# Patient Record
Sex: Female | Born: 1955 | Race: White | Hispanic: No | Smoking: Former smoker
Health system: Southern US, Community
[De-identification: ages and names within clinical notes are randomized; demographics above are authoritative.]

## PROBLEM LIST (undated history)

## (undated) DIAGNOSIS — R200 Anesthesia of skin: Secondary | ICD-10-CM

## (undated) DIAGNOSIS — Z8719 Personal history of other diseases of the digestive system: Secondary | ICD-10-CM

## (undated) DIAGNOSIS — K219 Gastro-esophageal reflux disease without esophagitis: Secondary | ICD-10-CM

## (undated) DIAGNOSIS — N951 Menopausal and female climacteric states: Secondary | ICD-10-CM

## (undated) DIAGNOSIS — E559 Vitamin D deficiency, unspecified: Secondary | ICD-10-CM

## (undated) DIAGNOSIS — D649 Anemia, unspecified: Secondary | ICD-10-CM

## (undated) DIAGNOSIS — T4145XA Adverse effect of unspecified anesthetic, initial encounter: Secondary | ICD-10-CM

## (undated) DIAGNOSIS — T8859XA Other complications of anesthesia, initial encounter: Secondary | ICD-10-CM

## (undated) DIAGNOSIS — Z8489 Family history of other specified conditions: Secondary | ICD-10-CM

## (undated) DIAGNOSIS — F32A Depression, unspecified: Secondary | ICD-10-CM

## (undated) DIAGNOSIS — F419 Anxiety disorder, unspecified: Secondary | ICD-10-CM

## (undated) DIAGNOSIS — R112 Nausea with vomiting, unspecified: Secondary | ICD-10-CM

## (undated) DIAGNOSIS — Z9889 Other specified postprocedural states: Secondary | ICD-10-CM

## (undated) DIAGNOSIS — M549 Dorsalgia, unspecified: Secondary | ICD-10-CM

## (undated) DIAGNOSIS — M1990 Unspecified osteoarthritis, unspecified site: Secondary | ICD-10-CM

## (undated) DIAGNOSIS — E785 Hyperlipidemia, unspecified: Secondary | ICD-10-CM

## (undated) DIAGNOSIS — G5600 Carpal tunnel syndrome, unspecified upper limb: Secondary | ICD-10-CM

## (undated) HISTORY — DX: Carpal tunnel syndrome, unspecified upper limb: G56.00

## (undated) HISTORY — PX: ABDOMINAL HYSTERECTOMY: SHX81

## (undated) HISTORY — DX: Hyperlipidemia, unspecified: E78.5

## (undated) HISTORY — DX: Menopausal and female climacteric states: N95.1

## (undated) HISTORY — DX: Dorsalgia, unspecified: M54.9

## (undated) HISTORY — PX: COLONOSCOPY: SHX174

---

## 1994-02-21 HISTORY — PX: OOPHORECTOMY: SHX86

## 1998-01-13 ENCOUNTER — Ambulatory Visit (HOSPITAL_COMMUNITY): Admission: RE | Admit: 1998-01-13 | Discharge: 1998-01-13 | Payer: Self-pay

## 2002-07-10 ENCOUNTER — Other Ambulatory Visit: Admission: RE | Admit: 2002-07-10 | Discharge: 2002-07-10 | Payer: Self-pay | Admitting: Family Medicine

## 2002-07-15 ENCOUNTER — Encounter: Payer: Self-pay | Admitting: Family Medicine

## 2002-07-15 ENCOUNTER — Encounter: Admission: RE | Admit: 2002-07-15 | Discharge: 2002-07-15 | Payer: Self-pay | Admitting: Family Medicine

## 2003-07-15 ENCOUNTER — Other Ambulatory Visit: Admission: RE | Admit: 2003-07-15 | Discharge: 2003-07-15 | Payer: Self-pay | Admitting: Internal Medicine

## 2004-04-08 ENCOUNTER — Ambulatory Visit: Payer: Self-pay | Admitting: Family Medicine

## 2004-08-27 ENCOUNTER — Other Ambulatory Visit: Admission: RE | Admit: 2004-08-27 | Discharge: 2004-08-27 | Payer: Self-pay | Admitting: Family Medicine

## 2004-08-27 ENCOUNTER — Ambulatory Visit: Payer: Self-pay | Admitting: Family Medicine

## 2004-10-19 ENCOUNTER — Ambulatory Visit: Payer: Self-pay | Admitting: Family Medicine

## 2006-03-23 ENCOUNTER — Ambulatory Visit: Payer: Self-pay | Admitting: Family Medicine

## 2006-04-06 ENCOUNTER — Ambulatory Visit: Payer: Self-pay | Admitting: Family Medicine

## 2006-10-24 ENCOUNTER — Ambulatory Visit: Payer: Self-pay | Admitting: Family Medicine

## 2007-05-04 ENCOUNTER — Ambulatory Visit: Payer: Self-pay | Admitting: Family Medicine

## 2007-05-04 DIAGNOSIS — J45909 Unspecified asthma, uncomplicated: Secondary | ICD-10-CM | POA: Insufficient documentation

## 2007-05-18 ENCOUNTER — Ambulatory Visit: Payer: Self-pay | Admitting: Family Medicine

## 2007-07-25 ENCOUNTER — Ambulatory Visit: Payer: Self-pay | Admitting: Family Medicine

## 2007-07-25 ENCOUNTER — Telehealth: Payer: Self-pay | Admitting: Family Medicine

## 2007-07-25 DIAGNOSIS — M5137 Other intervertebral disc degeneration, lumbosacral region: Secondary | ICD-10-CM | POA: Insufficient documentation

## 2007-07-30 ENCOUNTER — Telehealth: Payer: Self-pay | Admitting: Family Medicine

## 2007-07-30 ENCOUNTER — Ambulatory Visit: Payer: Self-pay | Admitting: Family Medicine

## 2007-08-13 ENCOUNTER — Ambulatory Visit: Payer: Self-pay | Admitting: Family Medicine

## 2007-08-21 ENCOUNTER — Ambulatory Visit: Payer: Self-pay | Admitting: Family Medicine

## 2007-08-21 DIAGNOSIS — J45909 Unspecified asthma, uncomplicated: Secondary | ICD-10-CM | POA: Insufficient documentation

## 2007-08-21 DIAGNOSIS — L821 Other seborrheic keratosis: Secondary | ICD-10-CM | POA: Insufficient documentation

## 2008-01-14 ENCOUNTER — Ambulatory Visit: Payer: Self-pay | Admitting: Family Medicine

## 2008-01-14 DIAGNOSIS — J029 Acute pharyngitis, unspecified: Secondary | ICD-10-CM | POA: Insufficient documentation

## 2008-01-14 LAB — CONVERTED CEMR LAB: Rapid Strep: NEGATIVE

## 2008-01-25 ENCOUNTER — Encounter: Admission: RE | Admit: 2008-01-25 | Discharge: 2008-01-25 | Payer: Self-pay | Admitting: Family Medicine

## 2008-01-30 ENCOUNTER — Ambulatory Visit: Payer: Self-pay | Admitting: Family Medicine

## 2008-01-30 LAB — CONVERTED CEMR LAB
Basophils Absolute: 0 10*3/uL (ref 0.0–0.1)
Basophils Relative: 0.4 % (ref 0.0–3.0)
Eosinophils Absolute: 0.2 10*3/uL (ref 0.0–0.7)
Eosinophils Relative: 2.7 % (ref 0.0–5.0)
HCT: 36.9 % (ref 36.0–46.0)
Hemoglobin: 12.4 g/dL (ref 12.0–15.0)
Lymphocytes Relative: 31.6 % (ref 12.0–46.0)
MCHC: 33.7 g/dL (ref 30.0–36.0)
MCV: 81.7 fL (ref 78.0–100.0)
Monocytes Absolute: 0.6 10*3/uL (ref 0.1–1.0)
Monocytes Relative: 8.6 % (ref 3.0–12.0)
Neutro Abs: 3.7 10*3/uL (ref 1.4–7.7)
Neutrophils Relative %: 56.7 % (ref 43.0–77.0)
Platelets: 260 10*3/uL (ref 150–400)
RBC: 4.51 M/uL (ref 3.87–5.11)
RDW: 13.5 % (ref 11.5–14.6)
TSH: 1.57 microintl units/mL (ref 0.35–5.50)
WBC: 6.6 10*3/uL (ref 4.5–10.5)

## 2008-02-06 ENCOUNTER — Other Ambulatory Visit: Admission: RE | Admit: 2008-02-06 | Discharge: 2008-02-06 | Payer: Self-pay | Admitting: Family Medicine

## 2008-02-06 ENCOUNTER — Encounter: Payer: Self-pay | Admitting: Family Medicine

## 2008-02-06 ENCOUNTER — Telehealth: Payer: Self-pay | Admitting: Family Medicine

## 2008-02-06 ENCOUNTER — Ambulatory Visit: Payer: Self-pay | Admitting: Family Medicine

## 2008-02-06 DIAGNOSIS — N959 Unspecified menopausal and perimenopausal disorder: Secondary | ICD-10-CM | POA: Insufficient documentation

## 2008-02-06 DIAGNOSIS — Z8669 Personal history of other diseases of the nervous system and sense organs: Secondary | ICD-10-CM | POA: Insufficient documentation

## 2008-02-06 DIAGNOSIS — G609 Hereditary and idiopathic neuropathy, unspecified: Secondary | ICD-10-CM | POA: Insufficient documentation

## 2008-02-06 DIAGNOSIS — M545 Low back pain, unspecified: Secondary | ICD-10-CM | POA: Insufficient documentation

## 2008-02-08 ENCOUNTER — Ambulatory Visit: Payer: Self-pay | Admitting: Family Medicine

## 2008-02-08 LAB — CONVERTED CEMR LAB
BUN: 8 mg/dL (ref 6–23)
CO2: 27 meq/L (ref 19–32)
Calcium: 9.4 mg/dL (ref 8.4–10.5)
Chloride: 108 meq/L (ref 96–112)
Cholesterol: 244 mg/dL (ref 0–200)
Creatinine, Ser: 0.7 mg/dL (ref 0.4–1.2)
Direct LDL: 160.2 mg/dL
GFR calc Af Amer: 114 mL/min
GFR calc non Af Amer: 94 mL/min
Glucose, Bld: 98 mg/dL (ref 70–99)
HDL: 56.7 mg/dL (ref >39.0)
Potassium: 4.4 meq/L (ref 3.5–5.1)
Sodium: 141 meq/L (ref 135–145)
Total CHOL/HDL Ratio: 4.3
Triglycerides: 109 mg/dL (ref 0–149)
VLDL: 22 mg/dL (ref 0–40)

## 2008-02-18 ENCOUNTER — Ambulatory Visit: Payer: Self-pay | Admitting: Family Medicine

## 2008-02-18 DIAGNOSIS — E785 Hyperlipidemia, unspecified: Secondary | ICD-10-CM | POA: Insufficient documentation

## 2008-02-27 ENCOUNTER — Ambulatory Visit: Payer: Self-pay | Admitting: Gastroenterology

## 2008-03-07 ENCOUNTER — Ambulatory Visit: Payer: Self-pay | Admitting: Family Medicine

## 2008-04-18 ENCOUNTER — Ambulatory Visit: Payer: Self-pay | Admitting: Family Medicine

## 2008-04-18 LAB — CONVERTED CEMR LAB
ALT: 17 units/L (ref 0–35)
AST: 20 units/L (ref 0–37)
Albumin: 3.7 g/dL (ref 3.5–5.2)
Alkaline Phosphatase: 68 units/L (ref 39–117)
Bilirubin, Direct: 0.1 mg/dL (ref 0.0–0.3)
Cholesterol: 185 mg/dL (ref 0–200)
HDL: 63.9 mg/dL (ref >39.0)
LDL Cholesterol: 102 mg/dL — ABNORMAL HIGH (ref 0–99)
Total Bilirubin: 0.6 mg/dL (ref 0.3–1.2)
Total CHOL/HDL Ratio: 2.9
Total Protein: 6.8 g/dL (ref 6.0–8.3)
Triglycerides: 94 mg/dL (ref 0–149)
VLDL: 19 mg/dL (ref 0–40)

## 2008-05-05 ENCOUNTER — Ambulatory Visit: Payer: Self-pay | Admitting: Family Medicine

## 2008-07-18 ENCOUNTER — Telehealth: Payer: Self-pay | Admitting: Family Medicine

## 2008-08-13 ENCOUNTER — Telehealth: Payer: Self-pay | Admitting: *Deleted

## 2008-09-01 DIAGNOSIS — H8110 Benign paroxysmal vertigo, unspecified ear: Secondary | ICD-10-CM | POA: Insufficient documentation

## 2008-09-02 ENCOUNTER — Ambulatory Visit: Payer: Self-pay | Admitting: Family Medicine

## 2008-09-02 DIAGNOSIS — F419 Anxiety disorder, unspecified: Secondary | ICD-10-CM | POA: Insufficient documentation

## 2008-09-08 ENCOUNTER — Ambulatory Visit: Payer: Self-pay | Admitting: Family Medicine

## 2008-09-08 DIAGNOSIS — J309 Allergic rhinitis, unspecified: Secondary | ICD-10-CM | POA: Insufficient documentation

## 2008-09-10 ENCOUNTER — Telehealth (INDEPENDENT_AMBULATORY_CARE_PROVIDER_SITE_OTHER): Payer: Self-pay | Admitting: *Deleted

## 2008-11-21 ENCOUNTER — Telehealth: Payer: Self-pay | Admitting: Family Medicine

## 2009-01-30 ENCOUNTER — Telehealth: Payer: Self-pay | Admitting: Family Medicine

## 2009-02-09 LAB — HM MAMMOGRAPHY

## 2009-02-11 ENCOUNTER — Encounter: Admission: RE | Admit: 2009-02-11 | Discharge: 2009-02-11 | Payer: Self-pay | Admitting: Family Medicine

## 2009-03-11 ENCOUNTER — Telehealth: Payer: Self-pay | Admitting: Family Medicine

## 2009-04-10 ENCOUNTER — Ambulatory Visit: Payer: Self-pay | Admitting: Family Medicine

## 2009-04-10 LAB — CONVERTED CEMR LAB
ALT: 17 units/L (ref 0–35)
AST: 18 units/L (ref 0–37)
Albumin: 3.4 g/dL — ABNORMAL LOW (ref 3.5–5.2)
Alkaline Phosphatase: 78 units/L (ref 39–117)
BUN: 9 mg/dL (ref 6–23)
Basophils Absolute: 0.1 10*3/uL (ref 0.0–0.1)
Basophils Relative: 0.9 % (ref 0.0–3.0)
Bilirubin Urine: NEGATIVE
Bilirubin, Direct: 0.1 mg/dL (ref 0.0–0.3)
CO2: 27 meq/L (ref 19–32)
Calcium: 9.1 mg/dL (ref 8.4–10.5)
Chloride: 108 meq/L (ref 96–112)
Cholesterol: 206 mg/dL — ABNORMAL HIGH (ref 0–200)
Creatinine, Ser: 0.7 mg/dL (ref 0.4–1.2)
Direct LDL: 135.2 mg/dL
Eosinophils Absolute: 0.2 10*3/uL (ref 0.0–0.7)
Eosinophils Relative: 3.2 % (ref 0.0–5.0)
GFR calc non Af Amer: 93.68 mL/min (ref >60)
Glucose, Bld: 92 mg/dL (ref 70–99)
HCT: 31.8 % — ABNORMAL LOW (ref 36.0–46.0)
HDL: 57.9 mg/dL (ref >39.00)
Hemoglobin: 10.3 g/dL — ABNORMAL LOW (ref 12.0–15.0)
Ketones, ur: NEGATIVE mg/dL
Leukocytes, UA: NEGATIVE
Lymphocytes Relative: 25 % (ref 12.0–46.0)
Lymphs Abs: 1.7 10*3/uL (ref 0.7–4.0)
MCHC: 32.4 g/dL (ref 30.0–36.0)
MCV: 76.2 fL — ABNORMAL LOW (ref 78.0–100.0)
Monocytes Absolute: 0.6 10*3/uL (ref 0.1–1.0)
Monocytes Relative: 9 % (ref 3.0–12.0)
Neutro Abs: 4.2 10*3/uL (ref 1.4–7.7)
Neutrophils Relative %: 61.9 % (ref 43.0–77.0)
Nitrite: NEGATIVE
Platelets: 295 10*3/uL (ref 150.0–400.0)
Potassium: 4.3 meq/L (ref 3.5–5.1)
RBC: 4.17 M/uL (ref 3.87–5.11)
RDW: 15.9 % — ABNORMAL HIGH (ref 11.5–14.6)
Sodium: 138 meq/L (ref 135–145)
Specific Gravity, Urine: 1.02 (ref 1.000–1.030)
TSH: 1.52 microintl units/mL (ref 0.35–5.50)
Total Bilirubin: 0.3 mg/dL (ref 0.3–1.2)
Total CHOL/HDL Ratio: 4
Total Protein, Urine: NEGATIVE mg/dL
Total Protein: 6.8 g/dL (ref 6.0–8.3)
Triglycerides: 118 mg/dL (ref 0.0–149.0)
Urine Glucose: NEGATIVE mg/dL
Urobilinogen, UA: 0.2 (ref 0.0–1.0)
VLDL: 23.6 mg/dL (ref 0.0–40.0)
WBC: 6.8 10*3/uL (ref 4.5–10.5)
pH: 7 (ref 5.0–8.0)

## 2009-04-13 ENCOUNTER — Ambulatory Visit: Payer: Self-pay | Admitting: Family Medicine

## 2009-04-14 ENCOUNTER — Telehealth: Payer: Self-pay | Admitting: Family Medicine

## 2009-04-15 ENCOUNTER — Encounter (INDEPENDENT_AMBULATORY_CARE_PROVIDER_SITE_OTHER): Payer: Self-pay | Admitting: *Deleted

## 2009-04-20 ENCOUNTER — Telehealth: Payer: Self-pay | Admitting: Family Medicine

## 2009-06-17 ENCOUNTER — Encounter: Payer: Self-pay | Admitting: Family Medicine

## 2009-07-03 ENCOUNTER — Ambulatory Visit (HOSPITAL_COMMUNITY): Admission: RE | Admit: 2009-07-03 | Discharge: 2009-07-03 | Payer: Self-pay | Admitting: Obstetrics and Gynecology

## 2009-07-31 ENCOUNTER — Ambulatory Visit: Payer: Self-pay | Admitting: Family Medicine

## 2009-07-31 LAB — CONVERTED CEMR LAB
BUN: 11 mg/dL (ref 6–23)
Basophils Absolute: 0 10*3/uL (ref 0.0–0.1)
Basophils Relative: 0.5 % (ref 0.0–3.0)
CO2: 30 meq/L (ref 19–32)
Calcium: 9.6 mg/dL (ref 8.4–10.5)
Chloride: 106 meq/L (ref 96–112)
Creatinine, Ser: 0.8 mg/dL (ref 0.4–1.2)
Eosinophils Absolute: 0.6 10*3/uL (ref 0.0–0.7)
Eosinophils Relative: 7.1 % — ABNORMAL HIGH (ref 0.0–5.0)
GFR calc non Af Amer: 82.58 mL/min (ref >60)
Glucose, Bld: 78 mg/dL (ref 70–99)
HCT: 38.7 % (ref 36.0–46.0)
Hemoglobin: 13.1 g/dL (ref 12.0–15.0)
Lymphocytes Relative: 21.1 % (ref 12.0–46.0)
Lymphs Abs: 1.9 10*3/uL (ref 0.7–4.0)
MCHC: 33.9 g/dL (ref 30.0–36.0)
MCV: 81.7 fL (ref 78.0–100.0)
Monocytes Absolute: 0.7 10*3/uL (ref 0.1–1.0)
Monocytes Relative: 7.8 % (ref 3.0–12.0)
Neutro Abs: 5.6 10*3/uL (ref 1.4–7.7)
Neutrophils Relative %: 63.5 % (ref 43.0–77.0)
Platelets: 294 10*3/uL (ref 150.0–400.0)
Potassium: 5.3 meq/L — ABNORMAL HIGH (ref 3.5–5.1)
RBC: 4.74 M/uL (ref 3.87–5.11)
RDW: 15.7 % — ABNORMAL HIGH (ref 11.5–14.6)
Sodium: 144 meq/L (ref 135–145)
WBC: 8.9 10*3/uL (ref 4.5–10.5)

## 2009-08-12 ENCOUNTER — Telehealth (INDEPENDENT_AMBULATORY_CARE_PROVIDER_SITE_OTHER): Payer: Self-pay | Admitting: *Deleted

## 2010-03-25 NOTE — Procedures (Signed)
Summary: Colonoscopy, EGD/[facility]  Colonoscopy, EGD/[facility]   Imported By: Maryln Gottron 06/22/2009 14:10:07  _____________________________________________________________________  External Attachment:    Type:   Image     Comment:   External Document

## 2010-03-25 NOTE — Progress Notes (Signed)
  Phone Note Other Incoming   Request: Send information Summary of Call: Records received from Dr. Marlane Hatcher with [facility]. 6 pages forwarded to Dr. Jarold Motto for review.

## 2010-03-25 NOTE — Assessment & Plan Note (Signed)
Summary: cpx/cjr   Vital Signs:  Patient profile:   55 year old female Height:      65 inches Weight:      187 pounds BMI:     31.23 Temp:     98.5 degrees F oral BP sitting:   124 / 84  (left arm) Cuff size:   regular  Vitals Entered By: Kern Reap CMA Duncan Dull) (April 13, 2009 4:09 PM)  Reason for Visit cpx   History of Present Illness: Patricia Ferrell is a 55 year old, married female, nonsmoker, who comes in today for physical examination  She has a history of underlying hyperlipidemia, but has not taken her Zocor and over 6 months.  She also has a history of mild depression.  She stopped her Celexa and feels okay except she has an occasional spell and anxiety.  She would like to keep some Ativan .5 intake p.r.n.  She gets routine eye care.  Dental care does BSE monthly.  Continue mammography Pap by GYN.  Tetanus 2009.  A new problem is numbness in both hands.  It comes and goes.  She does work with her hands all day long  she's also having symptoms of reflux esophagitis with regurgitation of food in her esophagus.  Allergies: 1)  ! Codeine  Past History:  Past medical, surgical, family and social histories (including risk factors) reviewed, and no changes noted (except as noted below).  Past Medical History: Reviewed history from 02/18/2008 and no changes required. childbirth x 1 left ovary removed,  exploratory laparoscopy, 89 Asthma Low back pain perimenopausal couple tunnel syndrome, right, left wrist Hyperlipidemia  Family History: Reviewed history from 05/04/2007 and no changes required. a father mid-70s.  He has occurred, colon polyps mother late 36s, coronary disease, MI, smoker, and hypertension one brother in good health.  One sister in good health  Social History: Reviewed history from 02/06/2008 and no changes required. 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  She is single with two  children Married Never Smoked Alcohol use-no Drug use-no Regular exercise-yes  Review of Systems      See HPI  Physical Exam  General:  Well-developed,well-nourished,in no acute distress; alert,appropriate and cooperative throughout examination Head:  Normocephalic and atraumatic without obvious abnormalities. No apparent alopecia or balding. Eyes:  No corneal or conjunctival inflammation noted. EOMI. Perrla. Funduscopic exam benign, without hemorrhages, exudates or papilledema. Vision grossly normal. Ears:  External ear exam shows no significant lesions or deformities.  Otoscopic examination reveals clear canals, tympanic membranes are intact bilaterally without bulging, retraction, inflammation or discharge. Hearing is grossly normal bilaterally. Nose:  External nasal examination shows no deformity or inflammation. Nasal mucosa are pink and moist without lesions or exudates. Mouth:  Oral mucosa and oropharynx without lesions or exudates.  Teeth in good repair. Neck:  No deformities, masses, or tenderness noted. Chest Wall:  No deformities, masses, or tenderness noted. Breasts:  No mass, nodules, thickening, tenderness, bulging, retraction, inflamation, nipple discharge or skin changes noted.   Lungs:  Normal respiratory effort, chest expands symmetrically. Lungs are clear to auscultation, no crackles or wheezes. Heart:  Normal rate and regular rhythm. S1 and S2 normal without gallop, murmur, click, rub or other extra sounds. Abdomen:  Bowel sounds positive,abdomen soft and non-tender without masses, organomegaly or hernias noted. Msk:  No deformity or scoliosis noted of thoracic or lumbar spine.   Pulses:  R and L carotid,radial,femoral,dorsalis pedis and posterior tibial pulses are full and equal  bilaterally Extremities:  No clubbing, cyanosis, edema, or deformity noted with normal full range of motion of all joints.   Neurologic:  No cranial nerve deficits noted. Station and gait are  normal. Plantar reflexes are down-going bilaterally. DTRs are symmetrical throughout. Sensory, motor and coordinative functions appear intact. Skin:  Intact without suspicious lesions or rashes Cervical Nodes:  No lymphadenopathy noted Axillary Nodes:  No palpable lymphadenopathy Inguinal Nodes:  No significant adenopathy Psych:  Cognition and judgment appear intact. Alert and cooperative with normal attention span and concentration. No apparent delusions, illusions, hallucinations   Impression & Recommendations:  Problem # 1:  ANXIETY (ICD-300.00) Assessment Improved  Her updated medication list for this problem includes:    Celexa 20 Mg Tabs (Citalopram hydrobromide) .Marland Kitchen... 1 tab @ bedtime    Fluoxetine Hcl 40 Mg Caps (Fluoxetine hcl) .Marland Kitchen... Take one tablet at bedtime    Ativan 0.5 Mg Tabs (Lorazepam) .Marland Kitchen... Take 1 tablet by mouth two times a day  Orders: Prescription Created Electronically [phone])  Problem # 2:  HYPERLIPIDEMIA (ICD-272.4) Assessment: Deteriorated  Her updated medication list for this problem includes:    Zocor 20 Mg Tabs (Simvastatin) .Marland Kitchen... 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 1 tab @ bedtime 2)  Fluoxetine Hcl 40 Mg Caps (Fluoxetine hcl) .... Take one tablet at bedtime 3)  Zocor 20 Mg Tabs (Simvastatin) .Marland Kitchen.. 1 tab @ bedtime 4)  Hydromet 5-1.5 Mg/10ml Syrp (Hydrocodone-homatropine) .Marland Kitchen.. 1 or 2 tsps three times a day as needed 5)  Ativan 0.5 Mg Tabs (Lorazepam) .... Take 1 tablet by mouth two times a day 6)  Zovirax 400 Mg Tabs (Acyclovir) .... One by mouth qid  Other  Orders: Gastroenterology Referral (GI)  Patient Instructions: 1)  Please schedule a follow-up appointment in 1 year. 2)  It is important that you exercise regularly at least 20 minutes 5 times a week. If you develop chest pain, have severe difficulty breathing, or feel very tired , stop exercising immediately and seek medical attention. 3)  Schedule your mammogram. 4)  Schedule a colonoscopy/sigmoidoscopy to help detect colon cancer. 5)  Take calcium +Vitamin D daily. 6)  Take an Aspirin every day. 7)  take Motrin, 600 mg twice a day with food, and where the short arm splints nightly.  If his symptoms do not improve call Dr. Molly Maduro Cypher.  The hand surgeon 8)  take iron, one tablet at bedtime x 3 months. 9)  Two the anti-reflex measures that we discussed, including Prilosec, 20 mg OTC twice a day.  We will get to set up for a GI consult Prescriptions: ATIVAN 0.5 MG TABS (LORAZEPAM) Take 1 tablet by mouth two times a day  #60 x 4   Entered and Authorized by:   Roderick Pee MD   Signed by:   Roderick Pee MD on 04/13/2009   Method used:   Print then Give to Patient   RxID:   [id number] ZOCOR 20 MG TABS (SIMVASTATIN) 1 tab @ bedtime  #100 x 3   Entered and Authorized by:   Roderick Pee MD   Signed by:   Roderick Pee MD on 04/13/2009   Method used:  Print then Give to Patient   RxID:   [id number]

## 2010-03-25 NOTE — Assessment & Plan Note (Signed)
Summary: diarrhea x 3 days//ccm   Vital Signs:  Patient profile:   55 year old female Weight:      157 pounds Temp:     97.5 degrees F oral BP sitting:   98 / 68  (left arm)  Vitals Entered By: Kern Reap CMA (AAMA) (July 31, 2009 10:02 AM) CC: diarrhea x 3 days   CC:  diarrhea x 3 days.  History of Present Illness: Patricia Ferrell is a 55 year old female, nonsmoker, who comes in today with a two-day history of diarrhea.  Last Friday.  She had a laparoscopic hysterectomy.  They took her uterus and left ovary out.  The ovary and the uterus were here to her colon and it required a lot of dissection.  She had her right ovary removed many years ago.  She tolerated the procedure well, had no complications and was discharged to home.  The following day.  She did well until two days ago, when she developed diarrhea.  Prior to that she does have a bowel movement for 4 days.  She had 5 loose small bowel movements today.  No fever, chills, or vomiting.  Postop she is not taking any ATB, only some pain pills as needed.  Allergies: 1)  ! Codeine  Social History: Reviewed history from 02/06/2008 and no changes required. 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  She is single with two children Married Never Smoked Alcohol use-no Drug use-no Regular exercise-yes  Review of Systems      See HPI  Physical Exam  General:  Well-developed,well-nourished,in no acute distress; alert,appropriate and cooperative throughout examination Abdomen:  the abdomen appears normal.  Bowel sounds are normal.  The stab wounds are covered with Steri-Strips.  There is diffuse tenderness, but no rebound   Problems:  Medical Problems Added: 1)  Dx of Diarrhea  (ICD-787.91)  Impression & Recommendations:  Problem # 1:  DIARRHEA (ICD-787.91) Assessment New  Orders: Venipuncture ([address]) TLB-BMP (Basic Metabolic Panel-BMET) (80048-METABOL) TLB-CBC Platelet -  w/Differential (85025-CBCD)  Complete Medication List: 1)  Celexa 20 Mg Tabs (Citalopram hydrobromide) .Marland Kitchen.. 1 tab @ bedtime 2)  Fluoxetine Hcl 40 Mg Caps (Fluoxetine hcl) .... Take one tablet at bedtime 3)  Zocor 20 Mg Tabs (Simvastatin) .Marland Kitchen.. 1 tab @ bedtime 4)  Hydromet 5-1.5 Mg/52ml Syrp (Hydrocodone-homatropine) .Marland Kitchen.. 1 or 2 tsps three times a day as needed 5)  Ativan 0.5 Mg Tabs (Lorazepam) .... Take 1 tablet by mouth two times a day 6)  Zovirax 400 Mg Tabs (Acyclovir) .... One by mouth qid  Patient Instructions: 1)  stay on a clear liquid diet for the next couple days.......... starting tomorrow and, you can add some soft food like bananas applesauce, dry toast baked chicken...........Marland Kitchenwhen you do resume a regular diet, avoid fat

## 2010-03-25 NOTE — Assessment & Plan Note (Signed)
Summary: dizziness/njr   Vital Signs:  Patient profile:   55 year old female Weight:      183 pounds Temp:     98.1 degrees F oral BP sitting:   120 / 90  (left arm) Cuff size:   regular  Vitals Entered By: Kern Reap CMA (September 02, 2008 12:06 PM)  Reason for Visit lightheaded, nausea, head pressure  History of Present Illness: Patricia Ferrell is a 55 year old female, who comes in today for evaluation of head congestion, nausea, vertigo and anxiety.  Yesterday, she developed the sudden onset of vertigo.  If she holds still she feels fine.  If she moves quickly.  She has a spinning sensation.  She has no hearing loss.  Neurologic review of systems negative.  Two weeks ago.  She stopped taking the Celexa.  She thought she was having side effects, and it wasn't doing any good.  We previously tried Zoloft and Prozac.  Neither of those compounds work either.  She states she has mood swings, but she does not describe bipolar type, depression.  Allergies: 1)  ! Codeine  Past History:  Past medical, surgical, family and social histories (including risk factors) reviewed, and no changes noted (except as noted below).  Past Medical History: Reviewed history from 02/18/2008 and no changes required. childbirth x 1 left ovary removed,  exploratory laparoscopy, 89 Asthma Low back pain perimenopausal couple tunnel syndrome, right, left wrist Hyperlipidemia  Family History: Reviewed history from 05/04/2007 and no changes required. a father mid-70s.  He has occurred, colon polyps mother late 56s, coronary disease, MI, smoker, and hypertension one brother in good health.  One sister in good health  Social History: Reviewed history from 02/06/2008 and no changes required. 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  She is single with two children Married Never Smoked Alcohol use-no Drug use-no Regular exercise-yes  Review of Systems  See HPI  Physical Exam  General:  Well-developed,well-nourished,in no acute distress; alert,appropriate and cooperative throughout examination Head:  Normocephalic and atraumatic without obvious abnormalities. No apparent alopecia or balding. Eyes:  No corneal or conjunctival inflammation noted. EOMI. Perrla. Funduscopic exam benign, without hemorrhages, exudates or papilledema. Vision grossly normal. Ears:  External ear exam shows no significant lesions or deformities.  Otoscopic examination reveals clear canals, tympanic membranes are intact bilaterally without bulging, retraction, inflammation or discharge. Hearing is grossly normal bilaterally. Nose:  External nasal examination shows no deformity or inflammation. Nasal mucosa are pink and moist without lesions or exudates. Mouth:  Oral mucosa and oropharynx without lesions or exudates.  Teeth in good repair. Neurologic:  No cranial nerve deficits noted. Station and gait are normal. Plantar reflexes are down-going bilaterally. DTRs are symmetrical throughout. Sensory, motor and coordinative functions appear intact. Psych:  Cognition and judgment appear intact. Alert and cooperative with normal attention span and concentration. No apparent delusions, illusions, hallucinations   Impression & Recommendations:  Problem # 1:  VERTIGO, POSITIONAL (ICD-386.11) Assessment New  Problem # 2:  ANXIETY (ICD-300.00) Assessment: Deteriorated  The following medications were removed from the medication list:    Celexa 40 Mg Tabs (Citalopram hydrobromide) .Marland Kitchen... Take one and half tabs at bedtime Her updated medication list for this problem includes:    Celexa 20 Mg Tabs (Citalopram hydrobromide) .Marland Kitchen... 1 tab @ bedtime    Fluoxetine Hcl 40 Mg Caps (Fluoxetine hcl) .Marland Kitchen... Take one tablet at bedtime    Ativan 0.5 Mg Tabs (Lorazepam) .Marland Kitchen... Take 1 tablet  by mouth two times a day  Complete Medication List: 1)  Flexeril 10 Mg Tabs (Cyclobenzaprine hcl) ....  Take 1 tablet by mouth three times a day 2)  Celexa 20 Mg Tabs (Citalopram hydrobromide) .Marland Kitchen.. 1 tab @ bedtime 3)  Fluoxetine Hcl 40 Mg Caps (Fluoxetine hcl) .... Take one tablet at bedtime 4)  Zocor 20 Mg Tabs (Simvastatin) .Marland Kitchen.. 1 tab @ bedtime 5)  Hydromet 5-1.5 Mg/49ml Syrp (Hydrocodone-homatropine) .Marland Kitchen.. 1 or 2 tsps three times a day as needed 6)  Ativan 0.5 Mg Tabs (Lorazepam) .... Take 1 tablet by mouth two times a day  Patient Instructions: 1)  begin Ativan .5 mg b.i.d.Marland Kitchen 2)  Note turned her head quickly.  It will trigger the vertigo. 3)  Call Dr. Rolly Pancake, [place] Mu for consultation use my name.  As the referring physician Prescriptions: ATIVAN 0.5 MG TABS (LORAZEPAM) Take 1 tablet by mouth two times a day  #60 x 2   Entered and Authorized by:   Roderick Pee MD   Signed by:   Roderick Pee MD on 09/02/2008   Method used:   Print then Give to Patient   RxID:   [id number]

## 2010-03-25 NOTE — Progress Notes (Signed)
Summary: lost prescription  Phone Note Call from Patient   Caller: Patient Call For: Roderick Pee MD Summary of Call: Pt. lost her Ativan prescription, and would like a new one called to [facility]). [phone] Initial call taken by: Lynann Beaver CMA,  April 20, 2009 12:02 PM  Follow-up for Phone Call        Ativan .5, dispense 60 tablets, directions one b.i.d. refill x 4.......... e-mail to pharmacy Follow-up by: Roderick Pee MD,  April 20, 2009 12:07 PM    Prescriptions: ATIVAN 0.5 MG TABS (LORAZEPAM) Take 1 tablet by mouth two times a day  #60 x 4   Entered by:   Lynann Beaver CMA   Authorized by:   Roderick Pee MD   Signed by:   Lynann Beaver CMA on 04/20/2009   Method used:   Telephoned to ...       [facility] [address] [phone]* (retail)       [address]       [place], [place]  [address]       Ph: [phone] or [phone]       Fax: [phone]   RxID:   [id number]  Pt lost prescription Dr. Tawanna Cooler gave her last.

## 2010-03-25 NOTE — Progress Notes (Signed)
Summary: cold sore  Phone Note Call from Patient   Caller: Patient Call For: Roderick Pee MD Summary of Call: Pt is asking if Dr. Tawanna Cooler would prescribe Rx for cold sore on lip? [phone] Initial call taken by: Lynann Beaver CMA,  January 30, 2009 9:31 AM  Follow-up for Phone Call        Zovirax 400 mg q.i.d., dispense 50 tabs refill x 2 Follow-up by: Roderick Pee MD,  January 30, 2009 12:33 PM    New/Updated Medications: ZOVIRAX 400 MG TABS (ACYCLOVIR) one by mouth qid Prescriptions: ZOVIRAX 400 MG TABS (ACYCLOVIR) one by mouth qid  #50 x 2   Entered by:   Lynann Beaver CMA   Authorized by:   Roderick Pee MD   Signed by:   Lynann Beaver CMA on 01/30/2009   Method used:   Electronically to        [facility]  [address] [id number] ([facility]       [address]       [place], [place]  [address]       Ph: [phone] or [phone]       Fax: [phone]   RxID:   [id number]  pt notified.

## 2010-03-25 NOTE — Progress Notes (Signed)
Summary: oow note was overturned by company doctor  Phone Note Call from Patient   Caller: Patient Call For: Dr. Tawanna Cooler Summary of Call: Pt. went back to work, and they had her see the company MD, and he has sent her back to light duty.  She is very upset. Please call. [phone] Initial call taken by: Lynann Beaver CMA,  July 25, 2007 1:57 PM  Follow-up for Phone Call        this is an issue that she needs to discuss with the company doctor Follow-up by: Roderick Pee MD,  July 26, 2007 6:44 PM  Additional Follow-up for Phone Call Additional follow up Details #1::        Msg left on pt personal home phone. Additional Follow-up by: Sid Falcon LPN,  July 26, 1476 11:57 AM

## 2010-03-25 NOTE — Letter (Signed)
Summary: New Patient letter  [facility]  [address] [place], [place] [address]   Phone: [phone]  Fax: [phone]       04/15/2009 MRN: [id number]  [facility] [address] [place], [place]  [address]  Dear [name],  Welcome to the Gastroenterology Division at [facility].    You are scheduled to see Dr.  Jarold Motto on 05-14-09 at 9:30AM on the 3rd floor at [facility], [address]  We ask that you try to arrive at our office 15 minutes prior to your appointment time to allow for check-in.  We would like you to complete the enclosed self-administered evaluation form prior to your visit and bring it with you on the day of your appointment.  We will review it with you.  Also, please bring a complete list of all your medications or, if you prefer, bring the medication bottles and we will list them.  Please bring your insurance card so that we may make a copy of it.  If your insurance requires a referral to see a specialist, please bring your referral form from your primary care physician.  Co-payments are due at the time of your visit and may be paid by cash, check or credit card.     Your office visit will consist of a consult with your physician (includes a physical exam), any laboratory testing he/she may order, scheduling of any necessary diagnostic testing (e.g. x-ray, ultrasound, CT-scan), and scheduling of a procedure (e.g. Endoscopy, Colonoscopy) if required.  Please allow enough time on your schedule to allow for any/all of these possibilities.    If you cannot keep your appointment, please call [phone] to cancel or reschedule prior to your appointment date.  This allows [place] the opportunity to schedule an appointment for another patient in need of care.  If you do not cancel or reschedule by 5 p.m. the business day prior to your appointment date, you will be charged a $50.00 late cancellation/no-show fee.    Thank you for  choosing [facility] for your medical needs.  We appreciate the opportunity to care for you.  Please visit [place] at our website  to learn more about our practice.                     Sincerely,                                                             The Gastroenterology Division

## 2010-03-25 NOTE — Progress Notes (Signed)
Summary: pt req to have addlt labs added to cpx labs, as before  Phone Note Call from Patient Call back at [phone] [facility] cell   Caller: Patient Summary of Call: Pt called and has schedule her cpx and fasting cpx labs. Pt is req that the additional bloodwork that Dr. Tawanna Cooler had order before, be added to this cpx labs.  Initial call taken by: Lucy Antigua,  March 11, 2009 3:58 PM  Follow-up for Phone Call        Fleet Contras please call Follow-up by: Roderick Pee MD,  March 12, 2009 1:13 PM  Additional Follow-up for Phone Call Additional follow up Details #1::        spoke with the patient and she had to come back last year for extra test that was no on her list.  she just wanted to make sure all tests were ordered for cpx. Additional Follow-up by: Kern Reap CMA Duncan Dull),  March 13, 2009 9:18 AM

## 2010-03-25 NOTE — Assessment & Plan Note (Signed)
Summary: cpx/mm   Vital Signs:  Patient Profile:   55 Years Old Female Height:     65 inches Weight:      170 pounds Temp:     97.7 degrees F oral Pulse rate:   60 / minute BP sitting:   118 / 84  (left arm) Cuff size:   regular  Vitals Entered By: Kern Reap CMA (February 06, 2008 9:52 AM)                 Chief Complaint:  cpx.  History of Present Illness: Patricia Ferrell is a 55 year old, married female, nonsmoker who comes in today for physical examination  She continues to have irregular periods at age 82.  She is premenopausal.  Otherwise no other symptoms.  She said for about 6 months and numbness and tingling in both hands.  It started when she began an exercise program and was using her wrist a lot.  It comes and goes.  Her work involves working with a handicapped.  She does not do typing etc.  She also complains of some tingling and pain in the outer portion of both feet.  She had a history of lumbar disk disease.  Currently no active treatment.  Pain is quiet for now.  Her father had colon polyps.  She should have a colonoscopy this year.  She also complains of frequent urination up to 10 times per day, but no nocturia.  Neurologic review of systems negative.  She does drink 45 cups of caffeinated coffee daily.  Glaucoma runs in her family, and she needs an eye exam.  We referred to an ophthalmologist, Dr. Emily Filbert.  She is due for a tetanus booster, which we will give her today    Updated Prior Medication List: FLEXERIL 10 MG  TABS (CYCLOBENZAPRINE HCL) Take 1 tablet by mouth three times a day  Current Allergies (reviewed today): ! CODEINE  Past Medical History:    Reviewed history from 08/21/2007 and no changes required:       childbirth x 1       left ovary removed,  exploratory laparoscopy, 89       Asthma       Low back pain       perimenopausal       couple tunnel syndrome, right, left wrist   Family History:    Reviewed history from 05/04/2007 and no  changes required:       a father mid-70s.  He has occurred, colon polyps       mother late 5s, coronary disease, MI, smoker, and hypertension       one brother in good health.  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  She is single with two children       Married       Never Smoked       Alcohol use-no       Drug use-no       Regular exercise-yes    Review of Systems      See HPI   Physical Exam  General:     Well-developed,well-nourished,in no acute distress; alert,appropriate and cooperative throughout examination Head:     Normocephalic and atraumatic without obvious abnormalities. No apparent alopecia or balding. Eyes:     No corneal or conjunctival inflammation noted.  EOMI. Perrla. Funduscopic exam benign, without hemorrhages, exudates or papilledema. Vision grossly normal. Ears:     External ear exam shows no significant lesions or deformities.  Otoscopic examination reveals clear canals, tympanic membranes are intact bilaterally without bulging, retraction, inflammation or discharge. Hearing is grossly normal bilaterally. Nose:     External nasal examination shows no deformity or inflammation. Nasal mucosa are pink and moist without lesions or exudates. Mouth:     Oral mucosa and oropharynx without lesions or exudates.  Teeth in good repair. Neck:     No deformities, masses, or tenderness noted. Chest Wall:     No deformities, masses, or tenderness noted. Breasts:     No mass, nodules, thickening, tenderness, bulging, retraction, inflamation, nipple discharge or skin changes noted.   Lungs:     Normal respiratory effort, chest expands symmetrically. Lungs are clear to auscultation, no crackles or wheezes. Heart:     Normal rate and regular rhythm. S1 and S2 normal without gallop, murmur, click, rub or other  extra sounds. Abdomen:     Bowel sounds positive,abdomen soft and non-tender without masses, organomegaly or hernias noted. Rectal:     No external abnormalities noted. Normal sphincter tone. No rectal masses or tenderness. Genitalia:     Pelvic Exam:        External: normal female genitalia without lesions or masses        Vagina: normal without lesions or masses        Cervix: normal without lesions or masses        Adnexa: normal bimanual exam without masses or fullness        Uterus: normal by palpation        Pap smear: performed Msk:     No deformity or scoliosis noted of thoracic or lumbar spine.   Pulses:     R and L carotid,radial,femoral,dorsalis pedis and posterior tibial pulses are full and equal bilaterally Extremities:     No clubbing, cyanosis, edema, or deformity noted with normal full range of motion of all joints.   Neurologic:     No cranial nerve deficits noted. Station and gait are normal. Plantar reflexes are down-going bilaterally. DTRs are symmetrical throughout. Sensory, motor and coordinative functions appear intact. Skin:     Intact without suspicious lesions or rashes Cervical Nodes:     No lymphadenopathy noted Axillary Nodes:     No palpable lymphadenopathy Inguinal Nodes:     No significant adenopathy Psych:     Cognition and judgment appear intact. Alert and cooperative with normal attention span and concentration. No apparent delusions, illusions, hallucinations    Impression & Recommendations:  Problem # 1:  LOW BACK PAIN (ICD-724.2) Assessment: Improved  Her updated medication list for this problem includes:    Flexeril 10 Mg Tabs (Cyclobenzaprine hcl) .Marland Kitchen... Take 1 tablet by mouth three times a day   Problem # 2:  ASTHMA (ICD-493.90) Assessment: Improved  The following medications were removed from the medication list:    Prednisone 20 Mg Tabs (Prednisone) ..... 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 Take 1 tablet by mouth three times a day 2)  Celexa 20 Mg Tabs (Citalopram hydrobromide) .Marland Kitchen.. 1 tab @ bedtime  Other Orders: TLB-Lipid Panel (80061-LIPID) Tdap => 61yrs IM (47829) Admin 1st Vaccine (56213) Gastroenterology Referral (GI)   Patient Instructions: 1)  Please schedule a follow-up appointment in 1 year. 2)  It is important that you exercise regularly at least 20 minutes 5 times a week. If you develop chest pain, have severe difficulty breathing, or feel very tired , stop exercising immediately and seek medical attention. 3)  You need to lose weight. Consider a lower calorie diet and regular exercise.  4)  Schedule your mammogram. 5)  Schedule a colonoscopy/sigmoidoscopy to help detect colon cancer. 6)  Take calcium +Vitamin D daily. 7)  Take an Aspirin every day. 8)  See your eye doctor yearly to check for diabetic eye damage.   Prescriptions: CELEXA 20 MG TABS (CITALOPRAM HYDROBROMIDE) 1 tab @ bedtime  #100 x 3   Entered and Authorized by:   Roderick Pee MD   Signed by:   Roderick Pee MD on 02/06/2008   Method used:   Electronically to        [facility]  [address] [id number]       [address]       [place], [place]  [address]       Ph: [phone] or [phone]       Fax: [phone]   RxID:   [id number]  ]  Tetanus/Td Vaccine    Vaccine Type: Tdap    Site: right deltoid    Mfr: Sanofi Pasteur    Dose: 0.5 ml    Route: IM    Given  by: Kern Reap CMA    Exp. Date: 02/28/2010    Lot #: [id number]

## 2010-03-25 NOTE — Progress Notes (Signed)
Summary: WANTS TO TRY ALTERNATIVE  Phone Note Call from Patient Call back at Work Phone [phone]   Caller: Patient- LIVE CALL Summary of Call: PATIENT IS ON CELEXA. SHE IS STILL GRINDING HER TEETH AND ISN'T GETTING BENEFITS OF THIS MED. SHE WOULD LIKE TO TRY SOMETHING ELSA. HER PHARMACY [facility] [address]. Initial call taken by: Warnell Forester,  Jul 18, 2008 4:24 PM    Increase Celexa to 30 mg nightly call for refills when needed.  May increase up to 80 mg.

## 2010-03-25 NOTE — Progress Notes (Signed)
Summary: Motrin  Phone Note Call from Patient   Caller: Patient Call For: Roderick Pee MD Summary of Call: Pt is requesting a prescription of Motrin 600 mg. one by mouth two times a day [facility] to [facility]) [phone] Initial call taken by: Lynann Beaver CMA,  April 14, 2009 4:20 PM  Follow-up for Phone Call        Fleet Contras please call........ OTC Motrin 200 mg 3 tabs b.i.d. Follow-up by: Roderick Pee MD,  April 14, 2009 6:03 PM  Additional Follow-up for Phone Call Additional follow up Details #1::        Phone Call Completed Additional Follow-up by: Kern Reap CMA Duncan Dull),  April 15, 2009 12:06 PM

## 2010-03-30 ENCOUNTER — Encounter: Payer: Self-pay | Admitting: Family Medicine

## 2010-03-30 ENCOUNTER — Ambulatory Visit (INDEPENDENT_AMBULATORY_CARE_PROVIDER_SITE_OTHER): Payer: BC Managed Care – PPO | Admitting: Family Medicine

## 2010-03-30 VITALS — BP 120/90 | Temp 97.8°F | Ht 65.0 in | Wt 163.0 lb

## 2010-03-30 DIAGNOSIS — M7552 Bursitis of left shoulder: Secondary | ICD-10-CM

## 2010-03-30 DIAGNOSIS — M67919 Unspecified disorder of synovium and tendon, unspecified shoulder: Secondary | ICD-10-CM

## 2010-03-30 MED ORDER — TRAMADOL HCL 50 MG PO TABS: ORAL_TABLET | ORAL | Status: DC

## 2010-03-30 NOTE — Progress Notes (Signed)
  Subjective:    Patient ID: Patricia Ferrell, female    DOB: 1955/11/30, 55 y.o.   MRN: [id number]  HPI Patricia Ferrell is a 55 year old, married female, nonsmoker, who comes in with a 6 week history of pain in her left shoulder.  She is right-handed.  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  She does not recall any trauma.  She states the pain is constant.  She cannot sleep at night, and she cannot externally rotate her shoulder.  Again, no history of previous shoulder problems.   Review of Systems Musculoskeletal review of systems negative.  Neurologic review of systems negative    Objective:   Physical Exam She is a well-developed, well-nourished, female, in no acute distress.  Examination of the left shoulder shows some palpable tenderness anteriorly.  Also the inability to Abduct her  Shoulder otherwise, full range of motion       Assessment & Plan:  Bursitis left shoulder.  Plan prednisone burst and taper also tramadol p.r.n. For severe pain at night because she is intolerant of both codeine and hydrocodone

## 2010-03-30 NOTE — Patient Instructions (Signed)
Prednisone two tabs x 3 days, one x 3 days, a half x 3 days, then half a tablet Monday, Wednesday, Friday, for a two-week taper.  Tramadol one tab at bedtime p.r.n. Severe pain.  If you don't see any improvement in the next couple weeks and call Dr. Norlene Campbell, orthopedist

## 2010-04-07 ENCOUNTER — Telehealth: Payer: Self-pay | Admitting: Family Medicine

## 2010-04-07 NOTE — Telephone Encounter (Signed)
Pain in shoulder not any better, would like to have cortisone injection.  Leaving to go out of town tomorrow would like to come in this afternoon.

## 2010-04-07 NOTE — Telephone Encounter (Signed)
Left message on machine for patient  Informing her that dr todd is away from the office this afternoon

## 2010-04-08 NOTE — Telephone Encounter (Signed)
Left message on machine for patient

## 2010-04-08 NOTE — Telephone Encounter (Signed)
Referred to [facility],,,,, Dr. Cleophas Dunker, screw to further  treatment

## 2010-04-15 ENCOUNTER — Other Ambulatory Visit: Payer: BC Managed Care – PPO | Admitting: Family Medicine

## 2010-04-15 DIAGNOSIS — Z0000 Encounter for general adult medical examination without abnormal findings: Secondary | ICD-10-CM

## 2010-04-15 LAB — CBC WITH DIFFERENTIAL/PLATELET
Basophils Absolute: 0 10*3/uL (ref 0.0–0.1)
Basophils Relative: 0.7 % (ref 0.0–3.0)
Eosinophils Absolute: 0.3 10*3/uL (ref 0.0–0.7)
Eosinophils Relative: 4.7 % (ref 0.0–5.0)
HCT: 37.2 % (ref 36.0–46.0)
Hemoglobin: 12.7 g/dL (ref 12.0–15.0)
Lymphocytes Relative: 35.9 % (ref 12.0–46.0)
Lymphs Abs: 2.1 10*3/uL (ref 0.7–4.0)
MCHC: 34 g/dL (ref 30.0–36.0)
MCV: 85.5 fl (ref 78.0–100.0)
Monocytes Absolute: 0.5 10*3/uL (ref 0.1–1.0)
Monocytes Relative: 8.6 % (ref 3.0–12.0)
Neutro Abs: 2.9 10*3/uL (ref 1.4–7.7)
Neutrophils Relative %: 50.1 % (ref 43.0–77.0)
Platelets: 233 10*3/uL (ref 150.0–400.0)
RBC: 4.35 Mil/uL (ref 3.87–5.11)
RDW: 13.9 % (ref 11.5–14.6)
WBC: 5.8 10*3/uL (ref 4.5–10.5)

## 2010-04-15 LAB — POCT URINALYSIS DIPSTICK
Bilirubin, UA: NEGATIVE
Blood, UA: NEGATIVE
Glucose, UA: NEGATIVE
Ketones, UA: NEGATIVE
Nitrite, UA: NEGATIVE
Protein, UA: NEGATIVE
Urobilinogen, UA: NEGATIVE
pH, UA: 6

## 2010-04-15 LAB — TSH: TSH: 0.98 u[IU]/mL (ref 0.35–5.50)

## 2010-04-15 LAB — HEPATIC FUNCTION PANEL
ALT: 16 U/L (ref 0–35)
AST: 18 U/L (ref 0–37)
Albumin: 3.7 g/dL (ref 3.5–5.2)
Alkaline Phosphatase: 78 U/L (ref 39–117)
Bilirubin, Direct: 0.1 mg/dL (ref 0.0–0.3)
Total Bilirubin: 0.5 mg/dL (ref 0.3–1.2)
Total Protein: 6.5 g/dL (ref 6.0–8.3)

## 2010-04-15 LAB — BASIC METABOLIC PANEL
BUN: 15 mg/dL (ref 6–23)
CO2: 28 mEq/L (ref 19–32)
Calcium: 9.3 mg/dL (ref 8.4–10.5)
Chloride: 106 mEq/L (ref 96–112)
Creatinine, Ser: 0.8 mg/dL (ref 0.4–1.2)
GFR: 78.84 mL/min (ref >60.00)
Glucose, Bld: 80 mg/dL (ref 70–99)
Potassium: 4 mEq/L (ref 3.5–5.1)
Sodium: 141 mEq/L (ref 135–145)

## 2010-04-15 LAB — LIPID PANEL
Cholesterol: 180 mg/dL (ref 0–200)
HDL: 47.4 mg/dL (ref >39.00)
LDL Cholesterol: 113 mg/dL — ABNORMAL HIGH (ref 0–99)
Total CHOL/HDL Ratio: 4
Triglycerides: 96 mg/dL (ref 0.0–149.0)
VLDL: 19.2 mg/dL (ref 0.0–40.0)

## 2010-04-22 ENCOUNTER — Ambulatory Visit (INDEPENDENT_AMBULATORY_CARE_PROVIDER_SITE_OTHER): Payer: BC Managed Care – PPO | Admitting: Family Medicine

## 2010-04-22 ENCOUNTER — Encounter: Payer: Self-pay | Admitting: Family Medicine

## 2010-04-22 VITALS — BP 110/78 | Temp 98.1°F | Ht 65.0 in | Wt 162.0 lb

## 2010-04-22 DIAGNOSIS — E785 Hyperlipidemia, unspecified: Secondary | ICD-10-CM

## 2010-04-22 DIAGNOSIS — Z0000 Encounter for general adult medical examination without abnormal findings: Secondary | ICD-10-CM

## 2010-04-22 DIAGNOSIS — F411 Generalized anxiety disorder: Secondary | ICD-10-CM

## 2010-04-22 MED ORDER — CITALOPRAM HYDROBROMIDE 20 MG PO TABS: 20.0000 mg | ORAL_TABLET | Freq: Every day | ORAL | Status: DC

## 2010-04-22 MED ORDER — ACYCLOVIR 400 MG PO TABS: 400.0000 mg | ORAL_TABLET | Freq: Four times a day (QID) | ORAL | Status: DC

## 2010-04-22 MED ORDER — LORAZEPAM 0.5 MG PO TABS: 0.5000 mg | ORAL_TABLET | Freq: Two times a day (BID) | ORAL | Status: DC

## 2010-04-22 NOTE — Patient Instructions (Signed)
Restart the Celexa 20 mg a day at bedtime, and continue the Ativan, .5 b.i.d.,,,,,,,,,,,,,Return    for removal of the seborrheic keratosis from her left breast.  At the time also we were removed.  Her skin tags gratis

## 2010-04-22 NOTE — Progress Notes (Signed)
  Subjective:    Patient ID: Patricia Ferrell, female    DOB: 10-17-1955, 55 y.o.   MRN: [id number]  Patricia Ferrell  is a delightful, 55 year old, married female Nonsmoker,,,,,,,,,,, husband is disabled, therefore, they are not sexually active,,,,,,,, who comes in today for general physical examination  She has a history of hyperlipidemia however, with diet, exercise and 40 pounds weight loss.  Her lipids have returned to normal.  She is taking Ativan .5 b.i.d., p.r.n. For anxiety.  She stopped the CelexaShe seen Dr. Cleophas Ferrell for evaluation of left shoulder pain.  She recently had a cortisone injection.  It didn't help very long.  She stated her back for follow-up.  In June of last year.  She had her uterus removed for nonmalignant reasons.  She also had her one remaining ovary removed.  She is due to go back to see the GYN for follow-up in June.  She gets routine eye care, the care, BSE monthly, and mammography, colonoscopy, 2011 normal.  Tetanus 2009.    Review of Systems  Constitutional: Negative.   HENT: Negative.   Eyes: Negative.   Respiratory: Negative.   Cardiovascular: Negative.   Gastrointestinal: Negative.   Genitourinary: Negative.   Musculoskeletal: Negative.   Neurological: Negative.   Hematological: Negative.   Psychiatric/Behavioral: Negative.        Objective:   Physical Exam  Constitutional: She appears well-developed and well-nourished.  HENT:  Head: Normocephalic and atraumatic.  Right Ear: External ear normal.  Left Ear: External ear normal.  Nose: Nose normal.  Mouth/Throat: Oropharynx is clear and moist.  Eyes: Conjunctivae and EOM are normal. Pupils are equal, round, and reactive to light.  Neck: Normal range of motion. Neck supple. No JVD present. No tracheal deviation present. No thyromegaly present.  Cardiovascular: Normal rate, regular rhythm, normal heart sounds and intact distal pulses.  Exam reveals no gallop and no friction rub.   No murmur  heard. Pulmonary/Chest: Effort normal and breath sounds normal. No stridor.  Abdominal: Soft. Bowel sounds are normal. She exhibits no distension and no mass. There is no tenderness. There is no rebound and no guarding.  Genitourinary:       Bilateral breast exam normal  Musculoskeletal: Normal range of motion.  Lymphadenopathy:    She has no cervical adenopathy.  Neurological: She is alert. She has normal reflexes. No cranial nerve deficit. She exhibits normal muscle tone. Coordination normal.  Skin: Skin is warm and dry.       She has a very large seborrheic keratosis on her left breast.  A red, irritated advised to return for removal  Psychiatric: She has a normal mood and affect. Her behavior is normal. Judgment and thought content normal.          Assessment & Plan:  Anxiety,,,,,,,,, Celexa 20 mg nightly along with Ativan, .5 b.i.d., p.r.n.Hyperlipidemia treated with diet and exercise, lipids, now normal, off medication.  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  Pain left shoulder.  Follow-up with Dr. Cleophas Ferrell

## 2010-05-03 ENCOUNTER — Other Ambulatory Visit: Payer: Self-pay | Admitting: Family Medicine

## 2010-05-03 DIAGNOSIS — Z1231 Encounter for screening mammogram for malignant neoplasm of breast: Secondary | ICD-10-CM

## 2010-05-13 ENCOUNTER — Ambulatory Visit: Payer: BC Managed Care – PPO

## 2010-05-17 ENCOUNTER — Ambulatory Visit
Admission: RE | Admit: 2010-05-17 | Discharge: 2010-05-17 | Disposition: A | Discharge status: Home / Self Care | Payer: BC Managed Care – PPO | Source: Ambulatory Visit | Attending: Family Medicine | Admitting: Family Medicine

## 2010-05-17 DIAGNOSIS — Z1231 Encounter for screening mammogram for malignant neoplasm of breast: Secondary | ICD-10-CM

## 2010-05-24 ENCOUNTER — Ambulatory Visit (INDEPENDENT_AMBULATORY_CARE_PROVIDER_SITE_OTHER): Payer: BC Managed Care – PPO | Admitting: Family Medicine

## 2010-06-29 ENCOUNTER — Ambulatory Visit: Payer: BC Managed Care – PPO | Admitting: Family Medicine

## 2010-08-24 ENCOUNTER — Ambulatory Visit (INDEPENDENT_AMBULATORY_CARE_PROVIDER_SITE_OTHER): Payer: BC Managed Care – PPO | Admitting: Family Medicine

## 2010-08-24 DIAGNOSIS — L989 Disorder of the skin and subcutaneous tissue, unspecified: Secondary | ICD-10-CM

## 2010-08-24 DIAGNOSIS — L820 Inflamed seborrheic keratosis: Secondary | ICD-10-CM

## 2010-08-30 NOTE — Progress Notes (Signed)
  Subjective:    Patient ID: Patricia Ferrell, female    DOB: [birth year], 55 y.o.   MRN: [id number]  Patricia Ferrell is a 54 year old female, who comes in today for removal of 3 lesions on her trunk that are red and inflamed.  She has light scan light.  Eyes and is in the high risk for skin cancer.  After informed consent, the lesions were anesthetized with 1% Xylocaine with epinephrine.  All 3 were removed in toto.  There were 8 mm in diameter.  The lesions were sent for pathologic analysis.  The bases were cauterized.  Band-Aids were applied.  She left the office in good condition.  No complications.  Subsequent path report showed tumor nevi.  The third was an inflamed seborrheic keratoses    Review of Systems    Negative Objective:   Physical Exam    See above    Assessment & Plan:  See above

## 2010-08-30 NOTE — Progress Notes (Signed)
Left message on machine for patient

## 2010-09-20 ENCOUNTER — Telehealth: Payer: Self-pay | Admitting: Family Medicine

## 2010-09-20 NOTE — Telephone Encounter (Signed)
Left message on machine for patient

## 2010-09-20 NOTE — Telephone Encounter (Signed)
Pt had gone to orthopedic in the past for shoulder pain and has forgotten where she went and the doctor that she saw. Pt said Dr. Tawanna Cooler had referred her to the doctor and was hoping he could refresh her memory because she is know having knee problems and would like to go back and see them again. Please contact pt.

## 2010-12-14 ENCOUNTER — Ambulatory Visit (INDEPENDENT_AMBULATORY_CARE_PROVIDER_SITE_OTHER): Payer: BC Managed Care – PPO | Admitting: Family Medicine

## 2010-12-14 ENCOUNTER — Encounter: Payer: Self-pay | Admitting: *Deleted

## 2010-12-14 ENCOUNTER — Encounter: Payer: Self-pay | Admitting: Family Medicine

## 2010-12-14 VITALS — BP 120/90 | Temp 98.0°F | Wt 174.0 lb

## 2010-12-14 DIAGNOSIS — M5137 Other intervertebral disc degeneration, lumbosacral region: Secondary | ICD-10-CM

## 2010-12-14 MED ORDER — HYDROCODONE-ACETAMINOPHEN 7.5-750 MG PO TABS: ORAL_TABLET | ORAL | Status: DC

## 2010-12-14 MED ORDER — CYCLOBENZAPRINE HCL 5 MG PO TABS: 5.0000 mg | ORAL_TABLET | Freq: Three times a day (TID) | ORAL | Status: AC | PRN

## 2010-12-14 MED ORDER — MEPERIDINE HCL 50 MG PO TABS: 50.0000 mg | ORAL_TABLET | Freq: Three times a day (TID) | ORAL | Status: AC | PRN

## 2010-12-14 NOTE — Progress Notes (Signed)
  Subjective:    Patient ID: Patricia Ferrell, female    DOB: 07/04/1955, 55 y.o.   MRN: [id number]  HPI Patricia Ferrell is a delightful, 55 year old, married female, nonsmoker, who comes in today for evaluation of left lumbar back pain for 3 weeks,  She's had recurrent back pain in the past.  She's had a history of lumbar disk disease.  It's always gotten better with medication rest and physical therapy.  Three weeks ago, began again.  She describes the discomfort as constant, sharp, an 8 on a scale of one to 10.  It doesn't radiate, but it hurts when she raises her left leg.  No bowel or bladder dysfunction.   Review of Systems    General neurologic and orthopedic review of systems negative Objective:   Physical Exam Well-developed well-nourished, female, slightly overweight. In severe pain.  Examination the abdomen was negative.  The leg showed normal sensation, reflexes, and muscle strength.  Straight leg raising positive left at 90 degrees.       Assessment & Plan:  Lumbar disk disease.  Plan bedrest, medication restart PT follow-up in one week

## 2010-12-14 NOTE — Patient Instructions (Signed)
Stay at bed rest all day today and tomorrow.  Flexeril one tablet 3 times daily.  Vicodin one tablet 3 times daily.  We will get you set up for physical therapy ASAP.  Thursday he began walking, however, avoid sitting.  Return in one week for follow-up, sooner if any problems

## 2010-12-21 ENCOUNTER — Encounter: Payer: Self-pay | Admitting: Family Medicine

## 2010-12-21 ENCOUNTER — Ambulatory Visit (INDEPENDENT_AMBULATORY_CARE_PROVIDER_SITE_OTHER): Payer: BC Managed Care – PPO | Admitting: Family Medicine

## 2010-12-21 DIAGNOSIS — M5137 Other intervertebral disc degeneration, lumbosacral region: Secondary | ICD-10-CM

## 2010-12-22 ENCOUNTER — Encounter: Payer: Self-pay | Admitting: Family Medicine

## 2010-12-22 NOTE — Patient Instructions (Signed)
Continue conservative therapy began physical therapy twice daily at home until pain free.  Then, PT daily forever.  Return p.r.n.

## 2010-12-22 NOTE — Progress Notes (Signed)
  Subjective:    Patient ID: Patricia Ferrell, female    DOB: January 21, 1956, 55 y.o.   MRN: [id number]  HPI Patricia Ferrell is a 55 year old single female, nonsmoker, who comes in today for reevaluation of back pain.  She was seen last week with acute lumbar disk disease and severe pain and normal.  Neurologic exam.  She was placed at bed rest for 48 hours given prescriptions for Flexeril and tramadol.......Marland Kitchen Which she didn't get filled because she didn't have any money.......Marland Kitchen And took some over-the-counter Motrin.  After two days of bed rest.  Her pain decreased by about 50%.  She comes in today for follow-up stating her pain is diminished and she has no neurologic symptoms.  Specifically, no numbness or weakness   Review of Systems    In general, and neurologic review of systems otherwise negative.  Specifically, no numbness or weakness Objective:   Physical Exam Flow Doppler nourished, female in no acute distress.  Examination spines is no palpable tenderness.  There is no palpable tenderness in the muscles.  There is some tenderness in the left SI joint.  In the supine position.  Straight leg raising sensation.  Reflexes, all negative.       Assessment & Plan:  Lumbar disk disease with no neurologic deficit.  Plan continue conservative therapy.  Follow-up p.r.n.

## 2011-03-25 ENCOUNTER — Ambulatory Visit (INDEPENDENT_AMBULATORY_CARE_PROVIDER_SITE_OTHER): Payer: BC Managed Care – PPO | Admitting: Internal Medicine

## 2011-03-25 ENCOUNTER — Encounter: Payer: Self-pay | Admitting: Internal Medicine

## 2011-03-25 DIAGNOSIS — M5137 Other intervertebral disc degeneration, lumbosacral region: Secondary | ICD-10-CM

## 2011-03-25 DIAGNOSIS — F411 Generalized anxiety disorder: Secondary | ICD-10-CM

## 2011-03-25 DIAGNOSIS — M542 Cervicalgia: Secondary | ICD-10-CM

## 2011-03-25 MED ORDER — LORAZEPAM 0.5 MG PO TABS: 0.5000 mg | ORAL_TABLET | Freq: Two times a day (BID) | ORAL | Status: DC

## 2011-03-25 MED ORDER — METHYLPREDNISOLONE ACETATE 80 MG/ML IJ SUSP
80.0000 mg | Freq: Once | INTRAMUSCULAR | Status: AC
  Administered 2011-03-25: 80 mg via INTRAMUSCULAR

## 2011-03-25 NOTE — Patient Instructions (Signed)
You  may move around, but avoid painful motions and activities.  Apply heat  to the sore area for 15 to 20 minutes 3 or 4 times daily for the next two to 3 days.  Call or return to clinic prn if these symptoms worsen or fail to improve as anticipated.  

## 2011-03-25 NOTE — Progress Notes (Signed)
  Subjective:    Patient ID: Patricia Ferrell, female    DOB: 08-17-1955, 56 y.o.   MRN: [id number]  HPI  56 year old patient who has a history of lumbar disc disease as well as left shoulder bursitis. She awoke at 5 AM today with left-sided posterior neck pain with radiation to the left upper back and shoulder area. Pain is aggravated by range of motion of the neck. Pain has not responded to heat and warm compresses. Denies any weakness involving the left arm    Review of Systems  Musculoskeletal: Positive for back pain (left posterior neck and left upper back discomfort).       Objective:   Physical Exam  Neck:       Range of motion of the head and neck in all spheres elicited pain  Musculoskeletal: Normal range of motion.       Passive range of motion of the left shoulder elicited no pain No weakness left arm          Assessment & Plan:   Cervical strain. We'll treat with Depo-Medrol and continue ibuprofen if needed. She does have Flexeril at home. Also refill lorazepam.

## 2012-03-19 ENCOUNTER — Encounter: Payer: Self-pay | Admitting: Family Medicine

## 2012-03-19 ENCOUNTER — Ambulatory Visit (INDEPENDENT_AMBULATORY_CARE_PROVIDER_SITE_OTHER): Payer: Self-pay | Admitting: Family Medicine

## 2012-03-19 VITALS — BP 120/84 | Temp 98.5°F

## 2012-03-19 DIAGNOSIS — M67919 Unspecified disorder of synovium and tendon, unspecified shoulder: Secondary | ICD-10-CM

## 2012-03-19 DIAGNOSIS — M719 Bursopathy, unspecified: Secondary | ICD-10-CM

## 2012-03-19 DIAGNOSIS — M5137 Other intervertebral disc degeneration, lumbosacral region: Secondary | ICD-10-CM

## 2012-03-19 DIAGNOSIS — M7552 Bursitis of left shoulder: Secondary | ICD-10-CM

## 2012-03-19 MED ORDER — TRAMADOL HCL 50 MG PO TABS: ORAL_TABLET | ORAL | Status: DC

## 2012-03-19 MED ORDER — DIAZEPAM 5 MG PO TABS: ORAL_TABLET | ORAL | Status: DC

## 2012-03-19 MED ORDER — KETOROLAC TROMETHAMINE 30 MG/ML IJ SOLN
60.0000 mg | Freq: Once | INTRAMUSCULAR | Status: AC
  Administered 2012-03-19: 60 mg via INTRAMUSCULAR

## 2012-03-19 NOTE — Patient Instructions (Signed)
Complete bed rest today Tuesday Wednesday and Thursday,,,,,,,,,,,,,, Friday get up walk around lie down walk lie down  Tramadol and Valium,,,,,,,,,,, one half to one of each 3 times daily as needed for back pain  Starting on Friday take the tramadol and Valium just at bedtime  Motrin 600 mg twice daily with food  Return on Monday for followup

## 2012-03-19 NOTE — Progress Notes (Signed)
  Subjective:    Patient ID: Patricia Ferrell, female    DOB: December 08, 1955, 57 y.o.   MRN: [id number]  HPI Trinadee is a 57 year old female single nonsmoker who comes in today for evaluation of low back pain  She's had recurrent episodes of low back pain over the last 10 years. Her last episode was a couple years ago. On January 6 of this year she fell at home and her back. She says her pain is constant she points to the left lumbar is a source of her discomfort. It is sometimes sharp sometimes dull it to 5-6 on a scale of 1-10. It does not radiate. No bowel or bladder dysfunction. She's been taking over-the-counter medication which includes muscle relaxants Naprosyn and Mobic with no relief.  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 Sensation muscle strength reflexes are within normal limits      Assessment & Plan:  Lumbar disease plan bed rest anti-inflammatories pain medication

## 2012-03-26 ENCOUNTER — Encounter: Payer: Self-pay | Admitting: Family Medicine

## 2012-03-26 ENCOUNTER — Ambulatory Visit (INDEPENDENT_AMBULATORY_CARE_PROVIDER_SITE_OTHER): Payer: Self-pay | Admitting: Family Medicine

## 2012-03-26 VITALS — BP 130/76

## 2012-03-26 DIAGNOSIS — M5137 Other intervertebral disc degeneration, lumbosacral region: Secondary | ICD-10-CM

## 2012-03-26 DIAGNOSIS — F411 Generalized anxiety disorder: Secondary | ICD-10-CM

## 2012-03-26 NOTE — Progress Notes (Signed)
  Subjective:    Patient ID: Patricia Ferrell, female    DOB: 1955-08-29, 57 y.o.   MRN: [id number]  HPI Patricia Ferrell is a 57 year old nonsmoking female who comes in today for followup of low back pain  We saw her last week with severe low back pain and she stated bed rest Tuesday Wednesday and most of Thursday with her medication and feels about 50% better. She was able to go back to work today   Review of Systems Review of systems otherwise negative    Objective:   Physical Exam Well-developed well-nourished female no acute distress examination of spine shows no bony tenderness. In the supine position sensation muscle strength reflexes straight leg raising are within normal limits  Lumbar disease and       Assessment & Plan:  Lumbar disease resolving plan Naprosyn 500 mg twice daily with food, Lortab and Valium one half to one of each at bedtime when necessary  Return when necessary

## 2012-03-26 NOTE — Patient Instructions (Signed)
Naprosyn 500 mg twice daily with food  Valium and tramadol............. one half to one of each at bedtime when necessary  When you're back pain has markedly diminished begin the exercise program  Valium 5 mg,,,,,,,,, one half to one tablet twice daily when necessary for anxiety

## 2012-06-12 ENCOUNTER — Other Ambulatory Visit: Payer: Self-pay | Admitting: Family Medicine

## 2012-07-20 ENCOUNTER — Ambulatory Visit (INDEPENDENT_AMBULATORY_CARE_PROVIDER_SITE_OTHER): Payer: Self-pay | Admitting: Family Medicine

## 2012-07-20 ENCOUNTER — Encounter: Payer: Self-pay | Admitting: Family Medicine

## 2012-07-20 VITALS — BP 130/86 | Temp 97.8°F | Wt 165.0 lb

## 2012-07-20 DIAGNOSIS — H6980 Other specified disorders of Eustachian tube, unspecified ear: Secondary | ICD-10-CM

## 2012-07-20 DIAGNOSIS — H6981 Other specified disorders of Eustachian tube, right ear: Secondary | ICD-10-CM

## 2012-07-20 DIAGNOSIS — R0982 Postnasal drip: Secondary | ICD-10-CM

## 2012-07-20 MED ORDER — FLUTICASONE PROPIONATE 50 MCG/ACT NA SUSP: 2.0000 | Freq: Every day | NASAL | Status: DC

## 2012-07-20 NOTE — Progress Notes (Signed)
Chief Complaint  Patient presents with  . right ear pain    off blalance     HPI:  Acute visit for ear pain: -started 2 weeks ago -was both ears, but now just R ear intermittent pain, some intermittent fullness, feels like something in her ear and sometimes sounds like and feels like water in ear, also feel a little off balance at times, was have nasal congestion too and took z pack but didn't help, had some diarrhea today -denies: fever, chills, HA, NV, hearing loss  ROS: See pertinent positives and negatives per HPI.  Past Medical History  Diagnosis Date  . Asthma   . Back pain     lumbar  . Perimenopausal   . Carpal tunnel syndrome   . Hyperlipidemia     Family History  Problem Relation Age of Onset  . Coronary artery disease Mother   . Heart attack Mother   . Hypertension Mother   . Colon polyps Father     History   Social History  . Marital Status: Married    Spouse Name: N/A    Number of Children: N/A  . Years of Education: N/A   Social History Main Topics  . Smoking status: Former Games developer  . Smokeless tobacco: None  . Alcohol Use: No  . Drug Use: No  . Sexually Active:    Other Topics Concern  . None   Social History Narrative  . None    Current outpatient prescriptions:acyclovir (ZOVIRAX) 400 MG tablet, Take 1 tablet (400 mg total) by mouth 4 (four) times daily., Disp: 100 tablet, Rfl: 2;  diazepam (VALIUM) 5 MG tablet, take 1 tablet by mouth three times a day, Disp: 90 tablet, Rfl: 1;  ibuprofen (ADVIL,MOTRIN) 800 MG tablet, Take 800 mg by mouth every 6 (six) hours as needed.  , Disp: , Rfl: ;  traMADol (ULTRAM) 50 MG tablet, 1 by mouth 3 times daily, Disp: 60 tablet, Rfl: 1 citalopram (CELEXA) 20 MG tablet, Take 1 tablet (20 mg total) by mouth daily., Disp: 100 tablet, Rfl: 3;  fluticasone (FLONASE) 50 MCG/ACT nasal spray, Place 2 sprays into the nose daily., Disp: 16 g, Rfl: 1;  LORazepam (ATIVAN) 0.5 MG tablet, Take 1 tablet (0.5 mg total) by mouth 2  (two) times daily., Disp: 50 tablet, Rfl: 3  EXAM:  Filed Vitals:   07/20/12 1426  BP: 130/86  Temp: 97.8 F (36.6 C)    Body mass index is 27.46 kg/(m^2).  GENERAL: vitals reviewed and listed above, alert, oriented, appears well hydrated and in no acute distress  HEENT: atraumatic, conjunttiva clear, no obvious abnormalities on inspection of external nose and ears, normal appearance of ear canals and TMs except for clear effusion, clear nasal congestion with boggy turbinates, mild post oropharyngeal erythema with PND, no tonsillar edema or exudate, no sinus TTP   NECK: no obvious masses on inspection  LUNGS: clear to auscultation bilaterally, no wheezes, rales or rhonchi, good air movement  CV: HRRR, no peripheral edema  MS: moves all extremities without noticeable abnormality  PSYCH: pleasant and cooperative, no obvious depression or anxiety  ASSESSMENT AND PLAN:  Discussed the following assessment and plan:  PND (post-nasal drip) - Plan: fluticasone (FLONASE) 50 MCG/ACT nasal spray, DISCONTINUED: fluticasone (FLONASE) 50 MCG/ACT nasal spray  Eustachian tube dysfunction, right - Plan: fluticasone (FLONASE) 50 MCG/ACT nasal spray, DISCONTINUED: fluticasone (FLONASE) 50 MCG/ACT nasal spray  -INS, claritin or zyrtec for 1 month -Patient advised to return or notify a doctor  immediately if symptoms worsen or persist or new concerns arise.  There are no Patient Instructions on file for this visit.   Colin Benton R.

## 2012-12-11 ENCOUNTER — Ambulatory Visit (INDEPENDENT_AMBULATORY_CARE_PROVIDER_SITE_OTHER): Payer: Self-pay | Admitting: Family Medicine

## 2012-12-11 ENCOUNTER — Encounter: Payer: Self-pay | Admitting: Family Medicine

## 2012-12-11 VITALS — BP 110/80 | Temp 97.6°F | Wt 155.0 lb

## 2012-12-11 DIAGNOSIS — J309 Allergic rhinitis, unspecified: Secondary | ICD-10-CM

## 2012-12-11 NOTE — Patient Instructions (Addendum)
Steroid nasal spray,,,,,,, one shot each nostril twice daily  Plain Zyrtec 10 mg,,,,,,,,,,, one tablet at bedtime  Call [phone] about your mammogram  If in a couple weeks she doesn't feel any better call and leave a voicemail with Fleet Contras and I will call you back in would discuss other options

## 2012-12-11 NOTE — Progress Notes (Signed)
  Subjective:    Patient ID: Patricia Ferrell, female    DOB: 03-07-1955, 57 y.o.   MRN: [id number]  HPI Patricia Ferrell is a 57 year old female nonsmoker who comes in today with a three-week history of tired head congestion difficulty breathing  Her symptoms started 3 weeks ago. Her daughter works for a Education officer, community and she got her daughter called her in a Z-Pak. Antibiotics obviously didn't help. Now she has had congestion difficulty breathing some coughing but not wheezing. She has had a history of allergic rhinitis and asthma. She's a nonsmoker  She has no health insurance  Last mammogram was 2 years ago    Review of Systems    review of systems otherwise negative Objective:   Physical Exam Well-developed well-nourished female no acute distress HEENT negative neck was supple no adenopathy lungs are clear cardiac exam normal       Assessment & Plan:  Allergic rhinitis ,,,,,, treat symptomatically with Zyrtec and steroid nasal spray

## 2012-12-26 ENCOUNTER — Telehealth: Payer: Self-pay | Admitting: Family Medicine

## 2012-12-26 MED ORDER — DIAZEPAM 5 MG PO TABS: ORAL_TABLET | ORAL | Status: DC

## 2012-12-26 NOTE — Telephone Encounter (Signed)
rx called in

## 2012-12-26 NOTE — Telephone Encounter (Signed)
Pt needs  New rx diazepam 5 mg. Pt phar is [facility]

## 2013-06-03 ENCOUNTER — Emergency Department (INDEPENDENT_AMBULATORY_CARE_PROVIDER_SITE_OTHER): Payer: Self-pay

## 2013-06-03 ENCOUNTER — Encounter (HOSPITAL_COMMUNITY): Payer: Self-pay | Admitting: Emergency Medicine

## 2013-06-03 ENCOUNTER — Emergency Department (INDEPENDENT_AMBULATORY_CARE_PROVIDER_SITE_OTHER)
Admission: EM | Admit: 2013-06-03 | Discharge: 2013-06-03 | Disposition: A | Discharge status: Home / Self Care | Payer: Self-pay | Source: Home / Self Care | Attending: Family Medicine | Admitting: Family Medicine

## 2013-06-03 ENCOUNTER — Ambulatory Visit: Payer: Self-pay | Admitting: Family Medicine

## 2013-06-03 DIAGNOSIS — S63501A Unspecified sprain of right wrist, initial encounter: Secondary | ICD-10-CM

## 2013-06-03 DIAGNOSIS — S63509A Unspecified sprain of unspecified wrist, initial encounter: Secondary | ICD-10-CM

## 2013-06-03 NOTE — ED Provider Notes (Signed)
CSN: [id number]     Arrival date & time 06/03/13  1617 History   First MD Initiated Contact with Patient 06/03/13 1758     Chief Complaint  Patient presents with  . Fall  . Hand Pain   (Consider location/radiation/quality/duration/timing/severity/associated sxs/prior Treatment) Patient is a 58 y.o. female presenting with fall. The history is provided by the patient.  Fall This is a new problem. The current episode started more than 2 days ago (fell and landed on right hand, using ice but still sore and swollen in wrist.). The problem has not changed since onset.The symptoms are aggravated by bending.    Past Medical History  Diagnosis Date  . Asthma   . Back pain     lumbar  . Perimenopausal   . Carpal tunnel syndrome   . Hyperlipidemia    Past Surgical History  Procedure Laterality Date  . Oophorectomy  02/21/94    left  . Abdominal hysterectomy      DUB   Family History  Problem Relation Age of Onset  . Coronary artery disease Mother   . Heart attack Mother   . Hypertension Mother   . Colon polyps Father    History  Substance Use Topics  . Smoking status: Former Games developermoker  . Smokeless tobacco: Not on file  . Alcohol Use: No   OB History   Grav Para Term Preterm Abortions TAB SAB Ect Mult Living                 Review of Systems  Constitutional: Negative.   Musculoskeletal: Positive for joint swelling.  Skin: Negative.     Allergies  Codeine  Home Medications   Current Outpatient Rx  Name  Route  Sig  Dispense  Refill  . diazepam (VALIUM) 5 MG tablet      every morning.         . traMADol (ULTRAM) 50 MG tablet      1 by mouth 3 times daily   60 tablet   1   . fluticasone (FLONASE) 50 MCG/ACT nasal spray   Nasal   Place 2 sprays into the nose daily.   16 g   1   . ibuprofen (ADVIL,MOTRIN) 800 MG tablet   Oral   Take 800 mg by mouth every 6 (six) hours as needed.            BP 118/78  Pulse 61  Temp(Src) 97.4 F (36.3 C) (Oral)  Resp  16  SpO2 99% Physical Exam  Nursing note and vitals reviewed. Constitutional: She is oriented to person, place, and time. She appears well-developed and well-nourished.  Musculoskeletal: She exhibits tenderness.       Right wrist: She exhibits decreased range of motion, tenderness and swelling. She exhibits no effusion and no deformity.       Arms: Neurological: She is alert and oriented to person, place, and time.  Skin: Skin is warm and dry.    ED Course  Procedures (including critical care time) Labs Review Labs Reviewed - No data to display Imaging Review Dg Wrist Complete Right  06/03/2013   CLINICAL DATA:  Pain and swelling.  Recent fall.  EXAM: RIGHT WRIST - COMPLETE 3+ VIEW  COMPARISON:  None.  FINDINGS: No evidence of fracture, dislocation or other focal finding.  IMPRESSION: Negative radiographs   Electronically Signed   By: Paulina FusiMark  Shogry M.D.   On: 06/03/2013 17:45     MDM   1. Sprain of right wrist  Linna HoffJames D Crislyn Willbanks, MD 06/04/13 [id number]

## 2013-06-03 NOTE — Discharge Instructions (Signed)
Warm soaks and wear splint for comfort as needed,

## 2013-06-03 NOTE — ED Notes (Signed)
Fell while roller skating Friday night and landed on her R side. Does not know how her hand landed. Hit her head on the floor. No LOC.  C/o pain in middle of her palm and base of R thumb.  States it was swollen over ring and small fingers, but has gone down.

## 2013-07-10 ENCOUNTER — Encounter (HOSPITAL_COMMUNITY): Payer: Self-pay | Admitting: Emergency Medicine

## 2013-07-10 ENCOUNTER — Emergency Department (INDEPENDENT_AMBULATORY_CARE_PROVIDER_SITE_OTHER)
Admission: EM | Admit: 2013-07-10 | Discharge: 2013-07-10 | Disposition: A | Discharge status: Home / Self Care | Payer: Self-pay | Source: Home / Self Care | Attending: Family Medicine | Admitting: Family Medicine

## 2013-07-10 DIAGNOSIS — M5430 Sciatica, unspecified side: Secondary | ICD-10-CM

## 2013-07-10 DIAGNOSIS — M5440 Lumbago with sciatica, unspecified side: Secondary | ICD-10-CM

## 2013-07-10 MED ORDER — PREDNISONE 10 MG PO KIT: PACK | ORAL | Status: DC

## 2013-07-10 MED ORDER — METHYLPREDNISOLONE ACETATE 80 MG/ML IJ SUSP
INTRAMUSCULAR | Status: AC
  Filled 2013-07-10: qty 1

## 2013-07-10 MED ORDER — CYCLOBENZAPRINE HCL 5 MG PO TABS: 5.0000 mg | ORAL_TABLET | Freq: Every evening | ORAL | Status: DC | PRN

## 2013-07-10 MED ORDER — KETOROLAC TROMETHAMINE 60 MG/2ML IM SOLN
60.0000 mg | Freq: Once | INTRAMUSCULAR | Status: AC
  Administered 2013-07-10: 60 mg via INTRAMUSCULAR

## 2013-07-10 MED ORDER — KETOROLAC TROMETHAMINE 60 MG/2ML IM SOLN
INTRAMUSCULAR | Status: AC
  Filled 2013-07-10: qty 2

## 2013-07-10 MED ORDER — METHYLPREDNISOLONE ACETATE 80 MG/ML IJ SUSP
80.0000 mg | Freq: Once | INTRAMUSCULAR | Status: AC
  Administered 2013-07-10: 80 mg via INTRAMUSCULAR

## 2013-07-10 NOTE — ED Notes (Signed)
Reports pain of the right lower back that radiates down right leg.  On set Saturday.  No relief with otc meds.  Denies injury.  Hx of chronic back pain.

## 2013-07-10 NOTE — Discharge Instructions (Signed)
Thank you for coming in today. Start taking the prednisone tomorrow Use Flexeril at bedtime as needed for pain.  Continue tramadol as needed for pain.  Come back or go to the emergency room if you notice new weakness new numbness problems walking or bowel or bladder problems.  Back Pain, Adult Back pain is very common. The pain often gets better over time. The cause of back pain is usually not dangerous. Most people can learn to manage their back pain on their own.  HOME CARE   Stay active. Start with short walks on flat ground if you can. Try to walk farther each day.  Do not sit, drive, or stand in one place for more than 30 minutes. Do not stay in bed.  Do not avoid exercise or work. Activity can help your back heal faster.  Be careful when you bend or lift an object. Bend at your knees, keep the object close to you, and do not twist.  Sleep on a firm mattress. Lie on your side, and bend your knees. If you lie on your back, put a pillow under your knees.  Only take medicines as told by your doctor.  Put ice on the injured area.  Put ice in a plastic bag.  Place a towel between your skin and the bag.  Leave the ice on for 15-20 minutes, 03-04 times a day for the first 2 to 3 days. After that, you can switch between ice and heat packs.  Ask your doctor about back exercises or massage.  Avoid feeling anxious or stressed. Find good ways to deal with stress, such as exercise. GET HELP RIGHT AWAY IF:   Your pain does not go away with rest or medicine.  Your pain does not go away in 1 week.  You have new problems.  You do not feel well.  The pain spreads into your legs.  You cannot control when you poop (bowel movement) or pee (urinate).  Your arms or legs feel weak or lose feeling (numbness).  You feel sick to your stomach (nauseous) or throw up (vomit).  You have belly (abdominal) pain.  You feel like you may pass out (faint). MAKE SURE YOU:   Understand these  instructions.  Will watch your condition.  Will get help right away if you are not doing well or get worse. Document Released: 07/27/2007 Document Revised: 05/02/2011 Document Reviewed: 06/28/2010 [facility] Patient Information 2014 [place], [place].  Back Exercises Back exercises help treat and prevent back injuries. The goal is to increase your strength in your belly (abdominal) and back muscles. These exercises can also help with flexibility. Start these exercises when told by your doctor. HOME CARE Back exercises include: Pelvic Tilt.  Lie on your back with your knees bent. Tilt your pelvis until the lower part of your back is against the floor. Hold this position 5 to 10 sec. Repeat this exercise 5 to 10 times. Knee to Chest.  Pull 1 knee up against your chest and hold for 20 to 30 seconds. Repeat this with the other knee. This may be done with the other leg straight or bent, whichever feels better. Then, pull both knees up against your chest. Sit-Ups or Curl-Ups.  Bend your knees 90 degrees. Start with tilting your pelvis, and do a partial, slow sit-up. Only lift your upper half 30 to 45 degrees off the floor. Take at least 2 to 3 seonds for each sit-up. Do not do sit-ups with your knees out straight.  If partial sit-ups are difficult, simply do the above but with only tightening your belly (abdominal) muscles and holding it as told. Hip-Lift.  Lie on your back with your knees flexed 90 degrees. Push down with your feet and shoulders as you raise your hips 2 inches off the floor. Hold for 10 seconds, repeat 5 to 10 times. Back Arches.  Lie on your stomach. Prop yourself up on bent elbows. Slowly press on your hands, causing an arch in your low back. Repeat 3 to 5 times. Shoulder-Lifts.  Lie face down with arms beside your body. Keep hips and belly pressed to floor as you slowly lift your head and shoulders off the floor. Do not overdo your exercises. Be careful in the beginning.  Exercises may cause you some mild back discomfort. If the pain lasts for more than 15 minutes, stop the exercises until you see your doctor. Improvement with exercise for back problems is slow.  Document Released: 03/12/2010 Document Revised: 05/02/2011 Document Reviewed: 12/09/2010 [facility] Patient Information 2014 [place]Care, [place].

## 2013-07-10 NOTE — ED Provider Notes (Signed)
Patricia Ferrell is a 58 y.o. female who presents to Urgent Care today for back pain radiating to the right leg present for days. No weakness or numbness bowel bladder dysfunction. She's tried tramadol Valium which has not helped much. No fevers chills nausea vomiting or diarrhea. No injury.   Past Medical History  Diagnosis Date  . Asthma   . Back pain     lumbar  . Perimenopausal   . Carpal tunnel syndrome   . Hyperlipidemia    History  Substance Use Topics  . Smoking status: Former Research scientist (life sciences)  . Smokeless tobacco: Not on file  . Alcohol Use: No   ROS as above Medications: No current facility-administered medications for this encounter.   Current Outpatient Prescriptions  Medication Sig Dispense Refill  . diazepam (VALIUM) 5 MG tablet every morning.      . traMADol (ULTRAM) 50 MG tablet 1 by mouth 3 times daily  60 tablet  1  . cyclobenzaprine (FLEXERIL) 5 MG tablet Take 1 tablet (5 mg total) by mouth at bedtime as needed for muscle spasms.  20 tablet  0  . fluticasone (FLONASE) 50 MCG/ACT nasal spray Place 2 sprays into the nose daily.  16 g  1  . ibuprofen (ADVIL,MOTRIN) 800 MG tablet Take 800 mg by mouth every 6 (six) hours as needed.        . PredniSONE 10 MG KIT 12 day dose pack po  1 kit  0    Exam:  BP 106/83  Pulse 81  Temp(Src) 97.6 F (36.4 C)  Resp 12  SpO2 99% Gen: Well NAD HEENT: EOMI,  MMM Lungs: Normal work of breathing. CTABL Heart: RRR no MRG Abd: NABS, Soft. NT, ND Exts: Brisk capillary refill, warm and well perfused. Non-edematous Back: Nontender spinal midline. Tender palpation right lumbar paraspinal. Negative straight leg raise test and Faber test bilaterally. Reflexes are intact bilateral lower extremities. Strength is intact bilateral extremities. Range of motion limited in flexion by pain and with normal extension. Sensation capillary refill and pulses are intact bilaterally   No results found for this or any previous visit (from the past 24  hour(s)). No results found.  Assessment and Plan: 58 y.o. female with lumbago with sciatica. Plan to treat with prednisone tramadol and Flexeril. Followup as needed.  Discussed warning signs or symptoms. Please see discharge instructions. Patient expresses understanding.    Gregor Hams, MD 07/10/13 1225

## 2014-03-17 ENCOUNTER — Encounter: Payer: Self-pay | Admitting: Family Medicine

## 2014-03-17 ENCOUNTER — Ambulatory Visit (INDEPENDENT_AMBULATORY_CARE_PROVIDER_SITE_OTHER): Payer: Self-pay | Admitting: Family Medicine

## 2014-03-17 VITALS — BP 120/88

## 2014-03-17 DIAGNOSIS — M5441 Lumbago with sciatica, right side: Secondary | ICD-10-CM

## 2014-03-17 DIAGNOSIS — M5137 Other intervertebral disc degeneration, lumbosacral region: Secondary | ICD-10-CM

## 2014-03-17 MED ORDER — PREDNISONE 20 MG PO TABS: ORAL_TABLET | ORAL | Status: DC

## 2014-03-17 MED ORDER — TRAMADOL HCL 50 MG PO TABS: ORAL_TABLET | ORAL | Status: DC

## 2014-03-17 MED ORDER — CYCLOBENZAPRINE HCL 5 MG PO TABS
5.0000 mg | ORAL_TABLET | Freq: Every evening | ORAL | Status: DC | PRN
Start: 2014-03-17 — End: 2014-06-21

## 2014-03-17 NOTE — Patient Instructions (Addendum)
Prednisone 20 mg......... 2 tablets now,,,,,,,, 2 tabs at bedtime tonight,,,,,,,,, starting tomorrow 2 tabs every morning  Flexeril 5 mg.......Marland Kitchen. 1 tablet 3 times daily  Tramadol 50 mg......Marland Kitchen. 1 tablet 3 times daily  Return on Thursday for follow-up  Complete bed rest at home

## 2014-03-17 NOTE — Progress Notes (Signed)
Pre visit review using our clinic review tool, if applicable. No additional management support is needed unless otherwise documented below in the visit note. 

## 2014-03-17 NOTE — Progress Notes (Signed)
   Subjective:    Patient ID: Patricia Ferrell, female    DOB: May 25, 1955, 59 y.o.   MRN: [id number]  HPI Patricia Ferrell is a [age] -year-old female nonsmoker who comes in today for evaluation of severe right-sided low back pain  She's had a couple episodes in the past. Her last episode was a year ago. She got better with bedrest and prednisone and pain medication. She's been symptom-free for about a year. Last Wednesday she did some lifting at a food bank the next day her back was bothering her and then Friday her pain became severe. She describes the pain as dull constant 5 on a scale of 1-10 when she is lying down benign when she stands up. The pain radiates down to her right calf. She tells me she's had x-rays in the past that showed degenerative disc disease L4-L5. This is consistent with her story. She has no numbness weakness bowel or bladder dysfunction    Review of Systems Review of systems otherwise negative    Objective:   Physical Exam  Well-developed well-nourished female in acute pain unable to sit up. Her parents had a driver the office.  In the supine position both legs were of equal length. Sensation muscle strength reflexes all within normal limits. Straight leg rate raising positive right leg 45      Assessment & Plan:  Lumbar disc disease.......... Bedrest.... Prednisone..... Medication follow-up Thursday

## 2014-03-20 ENCOUNTER — Encounter: Payer: Self-pay | Admitting: Family Medicine

## 2014-03-20 ENCOUNTER — Ambulatory Visit (INDEPENDENT_AMBULATORY_CARE_PROVIDER_SITE_OTHER): Payer: Self-pay | Admitting: Family Medicine

## 2014-03-20 VITALS — BP 124/78 | Temp 97.9°F

## 2014-03-20 DIAGNOSIS — M5137 Other intervertebral disc degeneration, lumbosacral region: Secondary | ICD-10-CM

## 2014-03-20 NOTE — Progress Notes (Signed)
Pre visit review using our clinic review tool, if applicable. No additional management support is needed unless otherwise documented below in the visit note. 

## 2014-03-20 NOTE — Progress Notes (Signed)
   Subjective:    Patient ID: Patricia Ferrell, female    DOB: [birth year], 59 y.o.   MRN: [id number]  HPI [name] is a 59 year old female nonsmoker who comes in today for evaluation of low back pain. We saw her earlier in the week with the sudden onset of severe low-back pain. She's had a history of lumbar disc disease recurrent episodes of pain. Her pain is gotten worse over the past year because she's gained weight. She also has not been exercising.  Her neurologic examination was normal. We placed on prednisone 40 mg daily Flexeril and tramadol. She comes back today for follow-up. She says she feels about 30% better. She was able to drive herself to the office. No side effects from the medication   Review of Systems    review of systems otherwise negative,,,,,,,,, has no health insurance now cashed pain patient Objective:   Physical Exam  Well-developed well-nourished overweight female no acute distress vital signs stable she's afebrile gait normal nerve exam normal      Assessment & Plan:  Lumbar disc disease without neurologic deficit........Marland Kitchen

## 2014-03-20 NOTE — Patient Instructions (Signed)
Prednisone 20 mg 1 tab 3 days, half a tab 3 days, then a half a tab every other day for a two-week taper  Flexeril and tramadol......... one of each at bedtime  Walk.............. lie down  Return when necessary

## 2014-06-13 ENCOUNTER — Encounter: Payer: Self-pay | Admitting: Family Medicine

## 2014-06-13 ENCOUNTER — Ambulatory Visit: Payer: Self-pay | Admitting: Family Medicine

## 2014-06-13 ENCOUNTER — Ambulatory Visit (INDEPENDENT_AMBULATORY_CARE_PROVIDER_SITE_OTHER): Payer: Self-pay | Admitting: Family Medicine

## 2014-06-13 VITALS — BP 131/90 | HR 78 | Temp 98.0°F | Ht 65.0 in | Wt 177.0 lb

## 2014-06-13 DIAGNOSIS — K219 Gastro-esophageal reflux disease without esophagitis: Secondary | ICD-10-CM

## 2014-06-13 DIAGNOSIS — R079 Chest pain, unspecified: Secondary | ICD-10-CM

## 2014-06-13 MED ORDER — OMEPRAZOLE 40 MG PO CPDR: 40.0000 mg | DELAYED_RELEASE_CAPSULE | Freq: Every day | ORAL | Status: DC

## 2014-06-13 NOTE — Progress Notes (Signed)
   Subjective:    Patient ID: Patricia Ferrell, female    DOB: [birth year], 59 y.o.   MRN: [id number]  HPI Here fro recurrent episodes of epigastric burning pains which sometimes go up into the chest and spread around to the back. She has had 3 such episodes in the past 2 weeks. She had one this morning and it woke her up from sleep about 5:30 am. This lasted about 90 minutes and then eased off. At the moment she feels fine. She denies any SOB with these spells and they are nor associated with exercise or with eating. Sometimes she feels like her food has a hard time going down when she swallows. She has frequent heartburn but usually takes nothing for it.    Review of Systems  Constitutional: Negative.   Respiratory: Negative.   Cardiovascular: Positive for chest pain. Negative for palpitations and leg swelling.  Gastrointestinal: Positive for nausea and abdominal pain. Negative for vomiting, diarrhea, constipation, blood in stool, abdominal distention and anal bleeding.       Objective:   Physical Exam  Constitutional: She appears well-developed and well-nourished. No distress.  Neck: Neck supple. No thyromegaly present.  Cardiovascular: Normal rate, regular rhythm, normal heart sounds and intact distal pulses.   Pulmonary/Chest: Effort normal and breath sounds normal.  Abdominal: Soft. Bowel sounds are normal. She exhibits no distension and no mass. There is no rebound and no guarding.  Mildly tender in the epigastrium   Lymphadenopathy:    She has no cervical adenopathy.          Assessment & Plan:  This sounds like chronic GERD with occasional esophageal spasms. She will start on Omeprazole 40 mg every day. Follow up with Dr. Tawanna Coolerodd. If this does not help she may need a repeat upper endoscopy.

## 2014-06-13 NOTE — Progress Notes (Signed)
Pre visit review using our clinic review tool, if applicable. No additional management support is needed unless otherwise documented below in the visit note. 

## 2014-06-17 ENCOUNTER — Encounter (HOSPITAL_COMMUNITY): Payer: Self-pay | Admitting: Emergency Medicine

## 2014-06-17 ENCOUNTER — Inpatient Hospital Stay (HOSPITAL_COMMUNITY)
Admission: EM | Admit: 2014-06-17 | Discharge: 2014-06-21 | DRG: 419 | Disposition: A | Discharge status: Home / Self Care | Payer: Self-pay | Attending: Surgery | Admitting: Surgery

## 2014-06-17 ENCOUNTER — Emergency Department (HOSPITAL_COMMUNITY): Payer: Self-pay

## 2014-06-17 DIAGNOSIS — R109 Unspecified abdominal pain: Secondary | ICD-10-CM | POA: Diagnosis present

## 2014-06-17 DIAGNOSIS — Z7982 Long term (current) use of aspirin: Secondary | ICD-10-CM

## 2014-06-17 DIAGNOSIS — E785 Hyperlipidemia, unspecified: Secondary | ICD-10-CM | POA: Diagnosis present

## 2014-06-17 DIAGNOSIS — A084 Viral intestinal infection, unspecified: Secondary | ICD-10-CM | POA: Diagnosis present

## 2014-06-17 DIAGNOSIS — K8000 Calculus of gallbladder with acute cholecystitis without obstruction: Principal | ICD-10-CM | POA: Diagnosis present

## 2014-06-17 DIAGNOSIS — R197 Diarrhea, unspecified: Secondary | ICD-10-CM | POA: Diagnosis present

## 2014-06-17 DIAGNOSIS — J45909 Unspecified asthma, uncomplicated: Secondary | ICD-10-CM | POA: Diagnosis present

## 2014-06-17 DIAGNOSIS — K810 Acute cholecystitis: Secondary | ICD-10-CM

## 2014-06-17 DIAGNOSIS — F419 Anxiety disorder, unspecified: Secondary | ICD-10-CM | POA: Diagnosis present

## 2014-06-17 DIAGNOSIS — Z90710 Acquired absence of both cervix and uterus: Secondary | ICD-10-CM

## 2014-06-17 DIAGNOSIS — Z87891 Personal history of nicotine dependence: Secondary | ICD-10-CM

## 2014-06-17 DIAGNOSIS — Z885 Allergy status to narcotic agent status: Secondary | ICD-10-CM

## 2014-06-17 DIAGNOSIS — J4520 Mild intermittent asthma, uncomplicated: Secondary | ICD-10-CM | POA: Diagnosis present

## 2014-06-17 DIAGNOSIS — Z7952 Long term (current) use of systemic steroids: Secondary | ICD-10-CM

## 2014-06-17 DIAGNOSIS — R1084 Generalized abdominal pain: Secondary | ICD-10-CM

## 2014-06-17 DIAGNOSIS — R112 Nausea with vomiting, unspecified: Secondary | ICD-10-CM | POA: Diagnosis present

## 2014-06-17 HISTORY — DX: Other complications of anesthesia, initial encounter: T88.59XA

## 2014-06-17 HISTORY — DX: Adverse effect of unspecified anesthetic, initial encounter: T41.45XA

## 2014-06-17 HISTORY — DX: Anxiety disorder, unspecified: F41.9

## 2014-06-17 HISTORY — DX: Other specified postprocedural states: R11.2

## 2014-06-17 HISTORY — DX: Other specified postprocedural states: Z98.890

## 2014-06-17 LAB — COMPREHENSIVE METABOLIC PANEL
ALT: 20 U/L (ref 0–35)
AST: 26 U/L (ref 0–37)
Albumin: 3.9 g/dL (ref 3.5–5.2)
Alkaline Phosphatase: 74 U/L (ref 39–117)
Anion gap: 12 (ref 5–15)
BUN: 10 mg/dL (ref 6–23)
CO2: 24 mmol/L (ref 19–32)
Calcium: 9.4 mg/dL (ref 8.4–10.5)
Chloride: 105 mmol/L (ref 96–112)
Creatinine, Ser: 0.97 mg/dL (ref 0.50–1.10)
GFR calc Af Amer: 74 mL/min — ABNORMAL LOW (ref >90)
GFR calc non Af Amer: 64 mL/min — ABNORMAL LOW (ref >90)
Glucose, Bld: 144 mg/dL — ABNORMAL HIGH (ref 70–99)
Potassium: 4 mmol/L (ref 3.5–5.1)
Sodium: 141 mmol/L (ref 135–145)
Total Bilirubin: 0.5 mg/dL (ref 0.3–1.2)
Total Protein: 7.1 g/dL (ref 6.0–8.3)

## 2014-06-17 LAB — CBC WITH DIFFERENTIAL/PLATELET
Basophils Absolute: 0.1 10*3/uL (ref 0.0–0.1)
Basophils Relative: 0 % (ref 0–1)
Eosinophils Absolute: 0.1 10*3/uL (ref 0.0–0.7)
Eosinophils Relative: 1 % (ref 0–5)
HCT: 39.8 % (ref 36.0–46.0)
Hemoglobin: 13.4 g/dL (ref 12.0–15.0)
Lymphocytes Relative: 18 % (ref 12–46)
Lymphs Abs: 2.2 10*3/uL (ref 0.7–4.0)
MCH: 28.3 pg (ref 26.0–34.0)
MCHC: 33.7 g/dL (ref 30.0–36.0)
MCV: 84 fL (ref 78.0–100.0)
Monocytes Absolute: 0.6 10*3/uL (ref 0.1–1.0)
Monocytes Relative: 5 % (ref 3–12)
Neutro Abs: 9.1 10*3/uL — ABNORMAL HIGH (ref 1.7–7.7)
Neutrophils Relative %: 76 % (ref 43–77)
Platelets: 238 10*3/uL (ref 150–400)
RBC: 4.74 MIL/uL (ref 3.87–5.11)
RDW: 13.4 % (ref 11.5–15.5)
WBC: 12 10*3/uL — ABNORMAL HIGH (ref 4.0–10.5)

## 2014-06-17 LAB — URINALYSIS, ROUTINE W REFLEX MICROSCOPIC
Bilirubin Urine: NEGATIVE
Glucose, UA: NEGATIVE mg/dL
Ketones, ur: NEGATIVE mg/dL
Leukocytes, UA: NEGATIVE
Nitrite: NEGATIVE
Protein, ur: NEGATIVE mg/dL
Specific Gravity, Urine: 1.023 (ref 1.005–1.030)
Urobilinogen, UA: 0.2 mg/dL (ref 0.0–1.0)
pH: 5.5 (ref 5.0–8.0)

## 2014-06-17 LAB — LIPASE, BLOOD: Lipase: 31 U/L (ref 11–59)

## 2014-06-17 LAB — TROPONIN I: Troponin I: <0.03 ng/mL (ref <0.031)

## 2014-06-17 LAB — URINE MICROSCOPIC-ADD ON

## 2014-06-17 MED ORDER — ONDANSETRON HCL 4 MG/2ML IJ SOLN
4.0000 mg | Freq: Once | INTRAMUSCULAR | Status: AC
  Administered 2014-06-17: 4 mg via INTRAVENOUS
  Filled 2014-06-17: qty 2

## 2014-06-17 MED ORDER — PANTOPRAZOLE SODIUM 40 MG IV SOLR
40.0000 mg | Freq: Once | INTRAVENOUS | Status: AC
  Administered 2014-06-17: 40 mg via INTRAVENOUS
  Filled 2014-06-17: qty 40

## 2014-06-17 MED ORDER — ONDANSETRON HCL 4 MG PO TABS: 4.0000 mg | ORAL_TABLET | Freq: Four times a day (QID) | ORAL | Status: DC | PRN

## 2014-06-17 MED ORDER — ACETAMINOPHEN 650 MG RE SUPP: 650.0000 mg | Freq: Four times a day (QID) | RECTAL | Status: DC | PRN

## 2014-06-17 MED ORDER — HYOSCYAMINE SULFATE 0.125 MG PO TABS
0.1250 mg | ORAL_TABLET | Freq: Once | ORAL | Status: AC
  Administered 2014-06-17: 0.125 mg via ORAL
  Filled 2014-06-17: qty 1

## 2014-06-17 MED ORDER — SENNOSIDES-DOCUSATE SODIUM 8.6-50 MG PO TABS: 1.0000 | ORAL_TABLET | Freq: Every evening | ORAL | Status: DC | PRN

## 2014-06-17 MED ORDER — MORPHINE SULFATE 2 MG/ML IJ SOLN: 1.0000 mg | INTRAMUSCULAR | Status: DC | PRN

## 2014-06-17 MED ORDER — FENTANYL CITRATE (PF) 100 MCG/2ML IJ SOLN
75.0000 ug | Freq: Once | INTRAMUSCULAR | Status: AC
  Administered 2014-06-17: 75 ug via INTRAVENOUS
  Filled 2014-06-17: qty 2

## 2014-06-17 MED ORDER — SODIUM CHLORIDE 0.9 % IV BOLUS (SEPSIS)
1000.0000 mL | Freq: Once | INTRAVENOUS | Status: AC
  Administered 2014-06-17: 1000 mL via INTRAVENOUS

## 2014-06-17 MED ORDER — PROMETHAZINE HCL 25 MG/ML IJ SOLN
25.0000 mg | Freq: Once | INTRAMUSCULAR | Status: AC
  Administered 2014-06-17: 25 mg via INTRAMUSCULAR
  Filled 2014-06-17: qty 1

## 2014-06-17 MED ORDER — ACETAMINOPHEN 325 MG PO TABS
650.0000 mg | ORAL_TABLET | Freq: Four times a day (QID) | ORAL | Status: DC | PRN
Start: 2014-06-17 — End: 2014-06-21
  Administered 2014-06-17 – 2014-06-18 (×2): 650 mg via ORAL
  Filled 2014-06-17 (×2): qty 2

## 2014-06-17 MED ORDER — IOHEXOL 300 MG/ML  SOLN: 25.0000 mL | Freq: Once | INTRAMUSCULAR | Status: DC | PRN

## 2014-06-17 MED ORDER — ONDANSETRON HCL 4 MG/2ML IJ SOLN
4.0000 mg | Freq: Four times a day (QID) | INTRAMUSCULAR | Status: DC | PRN
  Filled 2014-06-17: qty 2

## 2014-06-17 MED ORDER — FENTANYL CITRATE (PF) 100 MCG/2ML IJ SOLN
100.0000 ug | Freq: Once | INTRAMUSCULAR | Status: AC
  Administered 2014-06-17: 100 ug via INTRAVENOUS
  Filled 2014-06-17: qty 2

## 2014-06-17 MED ORDER — IOHEXOL 300 MG/ML  SOLN
80.0000 mL | Freq: Once | INTRAMUSCULAR | Status: AC | PRN
  Administered 2014-06-17: 80 mL via INTRAVENOUS

## 2014-06-17 MED ORDER — GI COCKTAIL ~~LOC~~
30.0000 mL | Freq: Once | ORAL | Status: AC
  Administered 2014-06-17: 30 mL via ORAL
  Filled 2014-06-17: qty 30

## 2014-06-17 MED ORDER — SODIUM CHLORIDE 0.9 % IV SOLN
INTRAVENOUS | Status: DC
  Administered 2014-06-17 – 2014-06-20 (×4): via INTRAVENOUS

## 2014-06-17 MED ORDER — HEPARIN SODIUM (PORCINE) 5000 UNIT/ML IJ SOLN
5000.0000 [IU] | Freq: Three times a day (TID) | INTRAMUSCULAR | Status: DC
  Filled 2014-06-17: qty 1

## 2014-06-17 NOTE — H&P (Signed)
[facility]          History and Physical    PCP:   Joycelyn Man, MD   EDP: Quintella Reichert, M.D.  Chief Complaint:  Nausea, vomiting, abdominal pain, diarrhea  HPI: Patient is a 59 year old woman without significant past medical history other than mild intermittent asthma not on medications who presents to the hospital today with the above-mentioned complaints that began about 12 hours ago. She had about 4-5 episodes of vomiting that was yellowish in color, no blood and has had multiple episodes of dry heaving since. She has also had about 5-6 episodes of diarrhea. She also has sharp epigastric abdominal pain that is cramping in nature. She denies any fever, recent travel, sick contacts, does not think she could've eaten any food that would've caused food poisoning. In the ED she has had a comprehensive workup that is only significant for a slight leukocytosis of 12,000. Lipase is normal at 31, LFTs are within normal limits, renal function is normal, urinalysis is negative for infection, abdominal ultrasound shows gallstones and sludge within the gallbladder but no sonographic evidence for acute cholecystitis, CT scan of the abdomen and pelvis shows mild nonspecific stranding within the base of the small bowel mesenteries which can be seen in the setting of nonspecific enteritis. There is no focal obstruction or mass lesion. Probable fatty infiltration of the liver as well as a single 18 mm gallstone without evidence for cholecystitis, chest x-ray is negative for acute abnormalities. She has failed to symptomatically improve despite multiple doses of anti-emetics and pain medication and has failed a by mouth challenge and hence we are asked to admit her for further evaluation and management.  Allergies:   Allergies  Allergen Reactions  . Codeine     REACTION: Nausea/vomiting      Past Medical History  Diagnosis Date  . Asthma   . Back pain     lumbar  .  Perimenopausal   . Carpal tunnel syndrome   . Hyperlipidemia     Past Surgical History  Procedure Laterality Date  . Oophorectomy  02/21/94    left  . Abdominal hysterectomy      DUB    Prior to Admission medications   Medication Sig Start Date End Date Taking? Authorizing Provider  aspirin 81 MG EC tablet Take 81 mg by mouth daily. Swallow whole.   Yes Historical Provider, MD  diazepam (VALIUM) 5 MG tablet Take 5 mg by mouth daily as needed for anxiety.  12/26/12  Yes Dorena Cookey, MD  omeprazole (PRILOSEC) 40 MG capsule Take 1 capsule (40 mg total) by mouth daily. 06/13/14  Yes Laurey Morale, MD  cyclobenzaprine (FLEXERIL) 5 MG tablet Take 1 tablet (5 mg total) by mouth at bedtime as needed for muscle spasms. Patient not taking: Reported on 06/13/2014 03/17/14   Dorena Cookey, MD  predniSONE (DELTASONE) 20 MG tablet 2 tabs x 3 days, 1 tab x 3 days, 1/2 tab x 3 days, 1/2 tab M,W,F x 2 weeks Patient not taking: Reported on 06/13/2014 03/17/14   Dorena Cookey, MD  traMADol Veatrice Bourbon) 50 MG tablet 1 by mouth 3 times daily Patient not taking: Reported on 06/17/2014 03/17/14   Dorena Cookey, MD    Social History:  reports that she has quit smoking. She has never used smokeless tobacco. She reports that she does not drink alcohol or use illicit drugs.  Family History  Problem Relation Age of Onset  . Coronary artery disease Mother   . Heart attack Mother   . Hypertension Mother   . Colon polyps Father     Review of Systems:  Constitutional: Denies fever, chills, diaphoresis, appetite change and fatigue.  HEENT: Denies photophobia, eye pain, redness, hearing loss, ear pain, congestion, sore throat, rhinorrhea, sneezing, mouth sores, trouble swallowing, neck pain, neck stiffness and tinnitus.   Respiratory: Denies SOB, DOE, cough, chest tightness,  and wheezing.   Cardiovascular: Denies chest pain, palpitations and leg swelling.  Gastrointestinal: Denies constipation, blood in stool and  abdominal distention.  Genitourinary: Denies dysuria, urgency, frequency, hematuria, flank pain and difficulty urinating.  Endocrine: Denies: hot or cold intolerance, sweats, changes in hair or nails, polyuria, polydipsia. Musculoskeletal: Denies myalgias, back pain, joint swelling, arthralgias and gait problem.  Skin: Denies pallor, rash and wound.  Neurological: Denies dizziness, seizures, syncope, weakness, light-headedness, numbness and headaches.  Hematological: Denies adenopathy. Easy bruising, personal or family bleeding history  Psychiatric/Behavioral: Denies suicidal ideation, mood changes, confusion, nervousness, sleep disturbance and agitation   Physical Exam: Blood pressure 114/75, pulse 74, temperature 97.8 F (36.6 C), temperature source Oral, resp. rate 13, SpO2 99 %. General: Alert, awake, oriented 3, significant dry heaving and abdominal pain during my exam. HEENT: Normocephalic, atraumatic, pupils equal round and reactive to light, dry mucous membranes, pharyngeal erythema. Neck: Supple, no JVD, no lymphadenopathy, no bruits, no goiter. Cardiovascular: Regular rate and rhythm, no murmurs, rubs or gallops. Lungs: Clear to auscultation bilaterally. Abdomen: Soft, diffusely tender to palpation however no rebound, guarding or rigidity, positive bowel sounds. Extremities: No clubbing, cyanosis or edema, positive pulses. Neurologic: Grossly intact and nonfocal.   Labs on Admission:  Results for orders placed or performed during the hospital encounter of 06/17/14 (from the past 48 hour(s))  Comprehensive metabolic panel     Status: Abnormal   Collection Time: 06/17/14  7:50 AM  Result Value Ref Range   Sodium 141 135 - 145 mmol/L   Potassium 4.0 3.5 - 5.1 mmol/L   Chloride 105 96 - 112 mmol/L   CO2 24 19 - 32 mmol/L   Glucose, Bld 144 (H) 70 - 99 mg/dL   BUN 10 6 - 23 mg/dL   Creatinine, Ser 0.97 0.50 - 1.10 mg/dL   Calcium 9.4 8.4 - 10.5 mg/dL   Total Protein 7.1 6.0 -  8.3 g/dL   Albumin 3.9 3.5 - 5.2 g/dL   AST 26 0 - 37 U/L   ALT 20 0 - 35 U/L   Alkaline Phosphatase 74 39 - 117 U/L   Total Bilirubin 0.5 0.3 - 1.2 mg/dL   GFR calc non Af Amer 64 (L) >90 mL/min   GFR calc Af Amer 74 (L) >90 mL/min    Comment: (NOTE) The eGFR has been calculated using the CKD EPI equation. This calculation has not been validated in all clinical situations. eGFR's persistently <90 mL/min signify possible Chronic Kidney Disease.    Anion gap 12 5 - 15  CBC with Differential     Status: Abnormal   Collection Time: 06/17/14  7:50 AM  Result Value Ref Range   WBC 12.0 (H) 4.0 - 10.5 K/uL   RBC 4.74 3.87 - 5.11 MIL/uL   Hemoglobin 13.4 12.0 - 15.0 g/dL   HCT 39.8 36.0 - 46.0 %   MCV 84.0 78.0 - 100.0 fL   MCH 28.3 26.0 - 34.0 pg   MCHC 33.7 30.0 - 36.0  g/dL   RDW 13.4 11.5 - 15.5 %   Platelets 238 150 - 400 K/uL   Neutrophils Relative % 76 43 - 77 %   Neutro Abs 9.1 (H) 1.7 - 7.7 K/uL   Lymphocytes Relative 18 12 - 46 %   Lymphs Abs 2.2 0.7 - 4.0 K/uL   Monocytes Relative 5 3 - 12 %   Monocytes Absolute 0.6 0.1 - 1.0 K/uL   Eosinophils Relative 1 0 - 5 %   Eosinophils Absolute 0.1 0.0 - 0.7 K/uL   Basophils Relative 0 0 - 1 %   Basophils Absolute 0.1 0.0 - 0.1 K/uL  Troponin I     Status: None   Collection Time: 06/17/14  7:50 AM  Result Value Ref Range   Troponin I <0.03 <0.031 ng/mL    Comment:        NO INDICATION OF MYOCARDIAL INJURY.   Lipase, blood     Status: None   Collection Time: 06/17/14  7:50 AM  Result Value Ref Range   Lipase 31 11 - 59 U/L  Urinalysis, Routine w reflex microscopic     Status: Abnormal   Collection Time: 06/17/14  9:50 AM  Result Value Ref Range   Color, Urine YELLOW YELLOW   APPearance CLEAR CLEAR   Specific Gravity, Urine 1.023 1.005 - 1.030   pH 5.5 5.0 - 8.0   Glucose, UA NEGATIVE NEGATIVE mg/dL   Hgb urine dipstick TRACE (A) NEGATIVE   Bilirubin Urine NEGATIVE NEGATIVE   Ketones, ur NEGATIVE NEGATIVE mg/dL    Protein, ur NEGATIVE NEGATIVE mg/dL   Urobilinogen, UA 0.2 0.0 - 1.0 mg/dL   Nitrite NEGATIVE NEGATIVE   Leukocytes, UA NEGATIVE NEGATIVE  Urine microscopic-add on     Status: None   Collection Time: 06/17/14  9:50 AM  Result Value Ref Range   RBC / HPF 0-2 <3 RBC/hpf    Radiological Exams on Admission: Ct Abdomen Pelvis W Contrast  06/17/2014   CLINICAL DATA:  Mid abdominal pain beginning last night.  EXAM: CT ABDOMEN AND PELVIS WITH CONTRAST  TECHNIQUE: Multidetector CT imaging of the abdomen and pelvis was performed using the standard protocol following bolus administration of intravenous contrast.  CONTRAST:  38m OMNIPAQUE IOHEXOL 300 MG/ML  SOLN  COMPARISON:  Pelvic ultrasound 07/03/2009  FINDINGS: Dependent atelectasis is present at both lung bases. Heart size is normal. No significant pleural or pericardial effusion is present.  There is probable fatty infiltration of the liver. No discrete lesions are seen. The spleen is within normal limits. Stomach, duodenum, and pancreas are within normal limits.  An 18 mm peripherally calcified stone is present at the fundus the gallbladder. There is no inflammation to suggest cholecystitis. The adrenal glands are normal bilaterally. Kidneys and ureters are within normal limits. Urinary bladder is unremarkable.  The rectosigmoid colon is within normal limits. A single calcified noted or calcification within and isolated diverticulum is noted adjacent to the sigmoid colon. No other significant adenopathy is present. Most of the colon is collapsed. The appendix is visualized and normal. Small bowel is unremarkable. There is scattered mild stranding within the small bowel mesenteries.  No significant free fluid is present. The patient is status post hysterectomy. Neither ovary is evident in both are likely surgically excised.  Mild degenerative changes are present in the lower thoracic spine. No focal lytic or blastic lesions are present.  IMPRESSION: 1. Mild  nonspecific stranding within the base of the small bowel mesenteries. This can be  seen in the setting of a nonspecific enteritis. No focal obstruction or mass lesion is present. 2. Single 18 mm gallstone evident without evidence for cholecystitis. 3. Probable fatty infiltration of the liver.   Electronically Signed   By: San Morelle M.D.   On: 06/17/2014 13:02   Dg Chest Port 1 View  06/17/2014   CLINICAL DATA:  Abdominal and chest pain  EXAM: PORTABLE CHEST - 1 VIEW  COMPARISON:  None.  FINDINGS: Normal heart size and mediastinal contours. No acute infiltrate or edema. No effusion or pneumothorax. No acute osseous findings.  IMPRESSION: Negative portable chest.   Electronically Signed   By: Monte Fantasia M.D.   On: 06/17/2014 07:58   US Abdomen Limited Ruq  06/17/2014   CLINICAL DATA:  Nausea, vomiting, epigastric pain since 5 a.m.  EXAM: US ABDOMEN LIMITED - RIGHT UPPER QUADRANT  COMPARISON:  None.  FINDINGS: Gallbladder:  Multiple gallstones, the largest 1.9 cm. Sludge within the gallbladder. No wall thickening. Negative sonographic Murphy's.  Common bile duct:  Diameter: Normal caliber, 6 mm  Liver:  No focal lesion identified. Within normal limits in parenchymal echogenicity.  IMPRESSION: Gallstones and sludge within the gallbladder. No current sonographic evidence of acute cholecystitis.   Electronically Signed   By: Rolm Baptise M.D.   On: 06/17/2014 14:23    Assessment/Plan Active Problems:   Abdominal pain   Nausea & vomiting   Diarrhea   Abdominal pain, nausea, vomiting, diarrhea -Clinical picture points towards an acute viral gastroenteritis. -Workup done in the emergency department is certainly reassuring. -Given failure to symptomatically improve in the emergency department and failed a by mouth fluid challenge, will admit patient as an observation overnight for IV fluids, antibiotics and pain medication with plans to discharge home in the morning if improved.  DVT  prophylaxis -Subcutaneous heparin  CODE STATUS -Full code as discussed with patient and daughter at bedside.  Time Spent on Admission: 80 minutes  HERNANDEZ ACOSTA,Dalen [place] [facility] Pager: [phone] 06/17/2014, 5:08 PM

## 2014-06-17 NOTE — ED Provider Notes (Signed)
CSN: [id number]     Arrival date & time 06/17/14  4098 History   First MD Initiated Contact with Patient 06/17/14 [phone]     Chief Complaint  Patient presents with  . Abdominal Pain  . Chest Pain     Patient is a 59 y.o. female presenting with abdominal pain and chest pain. The history is provided by the patient. No language interpreter was used.  Abdominal Pain Associated symptoms: chest pain   Chest Pain Associated symptoms: abdominal pain    Ms. Cairrikier presents for evaluation of chest pain and abdominal pain. She awoke at 5 AM with epigastric pain that radiates to her back. The pain is severe in nature and described as burning. She has associated vomiting of clear fluid and diarrhea that started this morning as well. She has been seen for 2 prior similar episodes that were milder nature and was diagnosed with reflux. She was started on omeprazole but has recurrent symptoms. She denies any fevers, shortness of breath. She has no major medical problems. Symptoms are severe, constant, worsening.  Past Medical History  Diagnosis Date  . Asthma   . Back pain     lumbar  . Perimenopausal   . Carpal tunnel syndrome   . Hyperlipidemia    Past Surgical History  Procedure Laterality Date  . Oophorectomy  02/21/94    left  . Abdominal hysterectomy      DUB   Family History  Problem Relation Age of Onset  . Coronary artery disease Mother   . Heart attack Mother   . Hypertension Mother   . Colon polyps Father    History  Substance Use Topics  . Smoking status: Former Games developer  . Smokeless tobacco: Never Used  . Alcohol Use: No   OB History    No data available     Review of Systems  Cardiovascular: Positive for chest pain.  Gastrointestinal: Positive for abdominal pain.  All other systems reviewed and are negative.     Allergies  Codeine  Home Medications   Prior to Admission medications   Medication Sig Start Date End Date Taking? Authorizing Provider   cyclobenzaprine (FLEXERIL) 5 MG tablet Take 1 tablet (5 mg total) by mouth at bedtime as needed for muscle spasms. Patient not taking: Reported on 06/13/2014 03/17/14   Roderick Pee, MD  diazepam (VALIUM) 5 MG tablet daily as needed.  12/26/12   Roderick Pee, MD  ibuprofen (ADVIL,MOTRIN) 800 MG tablet Take 220 mg by mouth every 6 (six) hours as needed. Over the counter    Historical Provider, MD  omeprazole (PRILOSEC) 40 MG capsule Take 1 capsule (40 mg total) by mouth daily. 06/13/14   Nelwyn Salisbury, MD  predniSONE (DELTASONE) 20 MG tablet 2 tabs x 3 days, 1 tab x 3 days, 1/2 tab x 3 days, 1/2 tab M,W,F x 2 weeks Patient not taking: Reported on 06/13/2014 03/17/14   Roderick Pee, MD  traMADol (ULTRAM) 50 MG tablet 1 by mouth 3 times daily Patient taking differently: 3 (three) times daily as needed.  03/17/14   Roderick Pee, MD   BP 138/73 mmHg  Pulse 79  Temp(Src) 97.8 F (36.6 C)  Resp 18  SpO2 100% Physical Exam  Constitutional: She is oriented to person, place, and time. She appears well-developed and well-nourished.  HENT:  Head: Normocephalic and atraumatic.  Cardiovascular: Normal rate and regular rhythm.   No murmur heard. Pulmonary/Chest: Effort normal and breath sounds normal. No  respiratory distress.  Abdominal: Soft. There is no rebound and no guarding.  Mild to moderate epigastric tenderness  Musculoskeletal: She exhibits no edema or tenderness.  Neurological: She is alert and oriented to person, place, and time.  Skin: Skin is warm and dry.  Psychiatric: She has a normal mood and affect. Her behavior is normal.  Nursing note and vitals reviewed.   ED Course  Procedures (including critical care time) Labs Review Labs Reviewed  COMPREHENSIVE METABOLIC PANEL - Abnormal; Notable for the following:    Glucose, Bld 144 (*)    GFR calc non Af Amer 64 (*)    GFR calc Af Amer 74 (*)    All other components within normal limits  CBC WITH DIFFERENTIAL/PLATELET -  Abnormal; Notable for the following:    WBC 12.0 (*)    Neutro Abs 9.1 (*)    All other components within normal limits  URINALYSIS, ROUTINE W REFLEX MICROSCOPIC - Abnormal; Notable for the following:    Hgb urine dipstick TRACE (*)    All other components within normal limits  TROPONIN I  LIPASE, BLOOD  URINE MICROSCOPIC-ADD ON    Imaging Review Ct Abdomen Pelvis W Contrast  06/17/2014   CLINICAL DATA:  Mid abdominal pain beginning last night.  EXAM: CT ABDOMEN AND PELVIS WITH CONTRAST  TECHNIQUE: Multidetector CT imaging of the abdomen and pelvis was performed using the standard protocol following bolus administration of intravenous contrast.  CONTRAST:  80mL OMNIPAQUE IOHEXOL 300 MG/ML  SOLN  COMPARISON:  Pelvic ultrasound 07/03/2009  FINDINGS: Dependent atelectasis is present at both lung bases. Heart size is normal. No significant pleural or pericardial effusion is present.  There is probable fatty infiltration of the liver. No discrete lesions are seen. The spleen is within normal limits. Stomach, duodenum, and pancreas are within normal limits.  An 18 mm peripherally calcified stone is present at the fundus the gallbladder. There is no inflammation to suggest cholecystitis. The adrenal glands are normal bilaterally. Kidneys and ureters are within normal limits. Urinary bladder is unremarkable.  The rectosigmoid colon is within normal limits. A single calcified noted or calcification within and isolated diverticulum is noted adjacent to the sigmoid colon. No other significant adenopathy is present. Most of the colon is collapsed. The appendix is visualized and normal. Small bowel is unremarkable. There is scattered mild stranding within the small bowel mesenteries.  No significant free fluid is present. The patient is status post hysterectomy. Neither ovary is evident in both are likely surgically excised.  Mild degenerative changes are present in the lower thoracic spine. No focal lytic or  blastic lesions are present.  IMPRESSION: 1. Mild nonspecific stranding within the base of the small bowel mesenteries. This can be seen in the setting of a nonspecific enteritis. No focal obstruction or mass lesion is present. 2. Single 18 mm gallstone evident without evidence for cholecystitis. 3. Probable fatty infiltration of the liver.   Electronically Signed   By: Marin Roberts M.D.   On: 06/17/2014 13:02   Dg Chest Port 1 View  06/17/2014   CLINICAL DATA:  Abdominal and chest pain  EXAM: PORTABLE CHEST - 1 VIEW  COMPARISON:  None.  FINDINGS: Normal heart size and mediastinal contours. No acute infiltrate or edema. No effusion or pneumothorax. No acute osseous findings.  IMPRESSION: Negative portable chest.   Electronically Signed   By: Marnee Spring M.D.   On: 06/17/2014 07:58   US Abdomen Limited Ruq  06/17/2014   CLINICAL  DATA:  Nausea, vomiting, epigastric pain since 5 a.m.  EXAM: US ABDOMEN LIMITED - RIGHT UPPER QUADRANT  COMPARISON:  None.  FINDINGS: Gallbladder:  Multiple gallstones, the largest 1.9 cm. Sludge within the gallbladder. No wall thickening. Negative sonographic Murphy's.  Common bile duct:  Diameter: Normal caliber, 6 mm  Liver:  No focal lesion identified. Within normal limits in parenchymal echogenicity.  IMPRESSION: Gallstones and sludge within the gallbladder. No current sonographic evidence of acute cholecystitis.   Electronically Signed   By: Charlett NoseKevin  Dover M.D.   On: 06/17/2014 14:23     EKG Interpretation   Date/Time:  Tuesday June 17 2014 07:18:14 EDT Ventricular Rate:  72 PR Interval:  138 QRS Duration: 88 QT Interval:  412 QTC Calculation: 451 R Axis:   59 Text Interpretation:  Normal sinus rhythm Normal ECG Confirmed by Lincoln Brighamees,  Liz [phone]([id number]) on 06/17/2014 7:27:21 AM      MDM   Final diagnoses:  Abdominal pain    Patient here for evaluation of abdominal pain. Patient with vomiting in the emergency department. Patient has epigastric tenderness on  exam. Patient has transient improvement in her pain after pain meds but this does return. Ultrasound and examination is not consistent with acute cholecystitis. Concern for ongoing pain despite pain medicines and recurrent nausea, medicine consulted for observation admission.    Tilden FossaElizabeth Janeen Watson, MD 06/17/14 [id number]

## 2014-06-17 NOTE — ED Notes (Signed)
Requested hyoscyamine from pharmacy.

## 2014-06-17 NOTE — ED Notes (Signed)
Onset of epigastric pain, woke up from sleep at 5 am, radiating through to back, was seen at Dr. Nelida Meuseodd's office on Friday for similar pain. Pt states has hx of hiatal hernia and reflux-- moaning constantly.

## 2014-06-17 NOTE — ED Notes (Signed)
Pt has decided to be admitted for symptom relief.

## 2014-06-18 ENCOUNTER — Encounter (HOSPITAL_COMMUNITY): Payer: Self-pay | Admitting: General Surgery

## 2014-06-18 DIAGNOSIS — G43A1 Cyclical vomiting, intractable: Secondary | ICD-10-CM

## 2014-06-18 DIAGNOSIS — R112 Nausea with vomiting, unspecified: Secondary | ICD-10-CM | POA: Diagnosis present

## 2014-06-18 DIAGNOSIS — K810 Acute cholecystitis: Secondary | ICD-10-CM

## 2014-06-18 LAB — BASIC METABOLIC PANEL
Anion gap: 9 (ref 5–15)
BUN: 7 mg/dL (ref 6–23)
CO2: 22 mmol/L (ref 19–32)
Calcium: 9 mg/dL (ref 8.4–10.5)
Chloride: 102 mmol/L (ref 96–112)
Creatinine, Ser: 0.7 mg/dL (ref 0.50–1.10)
GFR calc Af Amer: >90 mL/min (ref >90)
GFR calc non Af Amer: >90 mL/min (ref >90)
Glucose, Bld: 120 mg/dL — ABNORMAL HIGH (ref 70–99)
Potassium: 3.8 mmol/L (ref 3.5–5.1)
Sodium: 133 mmol/L — ABNORMAL LOW (ref 135–145)

## 2014-06-18 LAB — CBC
HCT: 39.2 % (ref 36.0–46.0)
Hemoglobin: 13.2 g/dL (ref 12.0–15.0)
MCH: 28 pg (ref 26.0–34.0)
MCHC: 33.7 g/dL (ref 30.0–36.0)
MCV: 83.2 fL (ref 78.0–100.0)
Platelets: 265 10*3/uL (ref 150–400)
RBC: 4.71 MIL/uL (ref 3.87–5.11)
RDW: 13.4 % (ref 11.5–15.5)
WBC: 14.1 10*3/uL — ABNORMAL HIGH (ref 4.0–10.5)

## 2014-06-18 LAB — HEPATIC FUNCTION PANEL
ALT: 22 U/L (ref 0–35)
AST: 23 U/L (ref 0–37)
Albumin: 3.6 g/dL (ref 3.5–5.2)
Alkaline Phosphatase: 81 U/L (ref 39–117)
Bilirubin, Direct: <0.1 mg/dL (ref 0.0–0.5)
Total Bilirubin: 0.7 mg/dL (ref 0.3–1.2)
Total Protein: 6.6 g/dL (ref 6.0–8.3)

## 2014-06-18 MED ORDER — HEPARIN SODIUM (PORCINE) 5000 UNIT/ML IJ SOLN
5000.0000 [IU] | Freq: Three times a day (TID) | INTRAMUSCULAR | Status: AC
  Administered 2014-06-18: 5000 [IU] via SUBCUTANEOUS
  Filled 2014-06-18: qty 1

## 2014-06-18 MED ORDER — HEPARIN SODIUM (PORCINE) 5000 UNIT/ML IJ SOLN: 5000.0000 [IU] | Freq: Three times a day (TID) | INTRAMUSCULAR | Status: DC

## 2014-06-18 MED ORDER — FENTANYL CITRATE (PF) 100 MCG/2ML IJ SOLN
25.0000 ug | INTRAMUSCULAR | Status: DC | PRN
  Administered 2014-06-18: 50 ug via INTRAVENOUS
  Administered 2014-06-19: 75 ug via INTRAVENOUS
  Administered 2014-06-19 (×3): 100 ug via INTRAVENOUS
  Administered 2014-06-20 (×2): 75 ug via INTRAVENOUS
  Filled 2014-06-18 (×7): qty 2

## 2014-06-18 MED ORDER — PROMETHAZINE HCL 25 MG/ML IJ SOLN
12.5000 mg | Freq: Three times a day (TID) | INTRAMUSCULAR | Status: DC | PRN
  Administered 2014-06-18 – 2014-06-20 (×3): 12.5 mg via INTRAVENOUS
  Filled 2014-06-18 (×3): qty 1

## 2014-06-18 MED ORDER — CEFTRIAXONE SODIUM IN DEXTROSE 40 MG/ML IV SOLN
2.0000 g | INTRAVENOUS | Status: DC
  Administered 2014-06-18 – 2014-06-20 (×3): 2 g via INTRAVENOUS
  Filled 2014-06-18 (×5): qty 50

## 2014-06-18 NOTE — Progress Notes (Signed)
[facility] PROGRESS NOTE  Patricia DriversKathy D Cairrikier WUJ:[id number] DOB: 10-31-1955 DOA: 06/17/2014 PCP: Evette GeorgesDD,JEFFREY ALLEN, MD  Assessment/Plan: 1. Suspected developing acute cholecystitis. -Patient presenting initially with complaints of nausea vomiting and diarrhea, on exam having right upper quadrant tenderness to palpation. Her white count continues to trend up, going from 12,000 on admission to 14,000 on this mornings labs. -Abdominal ultrasound revealed the presence of gallstones and sludge. -Gen. surgery was consulted as I suspect pain symptoms could reflect biliary disease. -Continue supportive care, IV fluid resuscitation, as needed IV narcotic analgesics for pain management. General surgery recommending initiating empiric IV antibiotic therapy with ceftriaxone.  Code Status: Full code Family Communication: I spoke with family members who were present at bedside Disposition Plan: General surgery consultation  Consultants:  General surgery   Antibiotics:  Ceftriaxone 2 g IV every 24 hours  HPI/Subjective: Patient is a pleasant 59 year old female with nose to begin past medical history who presented to the emergency department on 06/17/2014 with complaints of nausea vomiting and diarrhea along with right upper quadrant abdominal pain. She was worked up with a CT scan of abdomen and pelvis which showed nonspecific stranding within the base of the small bowel mesenteries, nonspecific finding could represent enteritis. Also noted was a single 18 mm gallstone. This was further worked up with abdominal ultrasound which did show multiple gallstones and sludge within the gallbladder. By the following day she continued to have right upper quadrant pain, positive Murphy's sign, with upward trend in her white count. Gen. surgery was consulted.  Objective: Filed Vitals:   06/18/14 1353  BP: 135/79  Pulse: 83  Temp: 97.8 F (36.6 C)  Resp: 18    Intake/Output Summary (Last 24 hours)  at 06/18/14 1447 Last data filed at 06/18/14 1100  Gross per 24 hour  Intake    120 ml  Output      1 ml  Net    119 ml   There were no vitals filed for this visit.  Exam:   General:  Patient is awake, alert, no acute distress, states feeling a little better today although he reported having episode of nausea with breakfast.  Cardiovascular: Regular rate and rhythm normal S1-S2  Respiratory: Clear to auscultation bilaterally  Abdomen: She has pain with palpations over her right upper quadrant, positive Murphy's sign, no rebound tenderness or guarding or peritoneal signs  Musculoskeletal: No edema  Data Reviewed: Basic Metabolic Panel:  Recent Labs Lab 06/17/14 0750 06/18/14 0403  NA 141 133*  K 4.0 3.8  CL 105 102  CO2 24 22  GLUCOSE 144* 120*  BUN 10 7  CREATININE 0.97 0.70  CALCIUM 9.4 9.0   Liver Function Tests:  Recent Labs Lab 06/17/14 0750  AST 26  ALT 20  ALKPHOS 74  BILITOT 0.5  PROT 7.1  ALBUMIN 3.9    Recent Labs Lab 06/17/14 0750  LIPASE 31   No results for input(s): AMMONIA in the last 168 hours. CBC:  Recent Labs Lab 06/17/14 0750 06/18/14 0403  WBC 12.0* 14.1*  NEUTROABS 9.1*  --   HGB 13.4 13.2  HCT 39.8 39.2  MCV 84.0 83.2  PLT 238 265   Cardiac Enzymes:  Recent Labs Lab 06/17/14 0750  TROPONINI <0.03   BNP (last 3 results) No results for input(s): BNP in the last 8760 hours.  ProBNP (last 3 results) No results for input(s): PROBNP in the last 8760 hours.  CBG: No results for input(s): GLUCAP in the last 168 hours.  No results found for this or any previous visit (from the past 240 hour(s)).   Studies: Ct Abdomen Pelvis W Contrast  06/17/2014   CLINICAL DATA:  Mid abdominal pain beginning last night.  EXAM: CT ABDOMEN AND PELVIS WITH CONTRAST  TECHNIQUE: Multidetector CT imaging of the abdomen and pelvis was performed using the standard protocol following bolus administration of intravenous contrast.  CONTRAST:   80mL OMNIPAQUE IOHEXOL 300 MG/ML  SOLN  COMPARISON:  Pelvic ultrasound 07/03/2009  FINDINGS: Dependent atelectasis is present at both lung bases. Heart size is normal. No significant pleural or pericardial effusion is present.  There is probable fatty infiltration of the liver. No discrete lesions are seen. The spleen is within normal limits. Stomach, duodenum, and pancreas are within normal limits.  An 18 mm peripherally calcified stone is present at the fundus the gallbladder. There is no inflammation to suggest cholecystitis. The adrenal glands are normal bilaterally. Kidneys and ureters are within normal limits. Urinary bladder is unremarkable.  The rectosigmoid colon is within normal limits. A single calcified noted or calcification within and isolated diverticulum is noted adjacent to the sigmoid colon. No other significant adenopathy is present. Most of the colon is collapsed. The appendix is visualized and normal. Small bowel is unremarkable. There is scattered mild stranding within the small bowel mesenteries.  No significant free fluid is present. The patient is status post hysterectomy. Neither ovary is evident in both are likely surgically excised.  Mild degenerative changes are present in the lower thoracic spine. No focal lytic or blastic lesions are present.  IMPRESSION: 1. Mild nonspecific stranding within the base of the small bowel mesenteries. This can be seen in the setting of a nonspecific enteritis. No focal obstruction or mass lesion is present. 2. Single 18 mm gallstone evident without evidence for cholecystitis. 3. Probable fatty infiltration of the liver.   Electronically Signed   By: Marin Roberts M.D.   On: 06/17/2014 13:02   Dg Chest Port 1 View  06/17/2014   CLINICAL DATA:  Abdominal and chest pain  EXAM: PORTABLE CHEST - 1 VIEW  COMPARISON:  None.  FINDINGS: Normal heart size and mediastinal contours. No acute infiltrate or edema. No effusion or pneumothorax. No acute osseous  findings.  IMPRESSION: Negative portable chest.   Electronically Signed   By: Marnee Spring M.D.   On: 06/17/2014 07:58   US Abdomen Limited Ruq  06/17/2014   CLINICAL DATA:  Nausea, vomiting, epigastric pain since 5 a.m.  EXAM: US ABDOMEN LIMITED - RIGHT UPPER QUADRANT  COMPARISON:  None.  FINDINGS: Gallbladder:  Multiple gallstones, the largest 1.9 cm. Sludge within the gallbladder. No wall thickening. Negative sonographic Murphy's.  Common bile duct:  Diameter: Normal caliber, 6 mm  Liver:  No focal lesion identified. Within normal limits in parenchymal echogenicity.  IMPRESSION: Gallstones and sludge within the gallbladder. No current sonographic evidence of acute cholecystitis.   Electronically Signed   By: Charlett Nose M.D.   On: 06/17/2014 14:23    Scheduled Meds: . cefTRIAXone (ROCEPHIN)  IV  2 g Intravenous Q24H  . heparin  5,000 Units Subcutaneous 3 times per day  . [START ON 06/20/2014] heparin  5,000 Units Subcutaneous 3 times per day   Continuous Infusions: . sodium chloride 100 mL/hr at 06/17/14 1815    Active Problems:   Abdominal pain   Nausea & vomiting   Diarrhea    Time spent: 25 minutes    Jeralyn Bennett  [facility] Pager [phone].  If 7PM-7AM, please contact night-coverage at [email or web address], password [facility] 06/18/2014, 2:47 PM

## 2014-06-18 NOTE — Progress Notes (Signed)
UR completed 

## 2014-06-18 NOTE — Consult Note (Signed)
Patricia Ferrell 04/12/1955  [id number].   Primary Care MD: Dr. Christie Nottingham Requesting MD: Dr. Odella Aquas Chief Complaint/Reason for Consult: RUQ abdominal pain HPI: this is a pleasant 59 yo white female with no significant past medical history who was admitted yesterday with N/V/D/abdominal pain.  The patient developed these symptoms yesterday morning at 0500am after eating chocolate and chocolate cake the night before.  She developed epigastric pain that radiated to her back and her chest.  She then developed nausea/vomiting/diarrhea.  She denies any fevers.  She has had 2-3 prior episodes of this same pain, but a little less severe within the last 2 weeks.  Each time she is awakened around 0500am after eating chocolate and chocolate cake the night before.  Those episodes past within a couple of hours, but this one did not.  She came to the ED and had multiple labs and test that revealed a WBC of 12K, CT scan that revealed a gallstone, ? Some mesenteric edema, maybe enteritis, and an [place] that revealed gallstones and sludge.  She was admitted for symptom control.  Today she states her pain is worsened even with liquids.  Her pain is more focally in the RUQ today.  We have been asked to see her for further evaluation.  ROS : Please see HPI, otherwise she does admit to some occasional "spit up" likely from GERD, otherwise all other systems have been reviewed and are negative  Family History  Problem Relation Age of Onset  . Coronary artery disease Mother   . Heart attack Mother   . Hypertension Mother   . Colon polyps Father     Past Medical History  Diagnosis Date  . Asthma   . Back pain     lumbar  . Perimenopausal   . Carpal tunnel syndrome   . Hyperlipidemia     Past Surgical History  Procedure Laterality Date  . Oophorectomy  02/21/94    left  . Abdominal hysterectomy      DUB    Social History:  reports that she has quit smoking. She has never used smokeless tobacco. She  reports that she drinks alcohol. She reports that she does not use illicit drugs.  Allergies:  Allergies  Allergen Reactions  . Codeine     REACTION: Nausea/vomiting    Medications Prior to Admission  Medication Sig Dispense Refill  . aspirin 81 MG EC tablet Take 81 mg by mouth daily. Swallow whole.    . diazepam (VALIUM) 5 MG tablet Take 5 mg by mouth daily as needed for anxiety.     Marland Kitchen omeprazole (PRILOSEC) 40 MG capsule Take 1 capsule (40 mg total) by mouth daily. 30 capsule 0  . cyclobenzaprine (FLEXERIL) 5 MG tablet Take 1 tablet (5 mg total) by mouth at bedtime as needed for muscle spasms. (Patient not taking: Reported on 06/13/2014) 60 tablet 3  . predniSONE (DELTASONE) 20 MG tablet 2 tabs x 3 days, 1 tab x 3 days, 1/2 tab x 3 days, 1/2 tab M,W,F x 2 weeks (Patient not taking: Reported on 06/13/2014) 40 tablet 1  . traMADol (ULTRAM) 50 MG tablet 1 by mouth 3 times daily (Patient not taking: Reported on 06/17/2014) 60 tablet 1    Blood pressure 135/79, pulse 83, temperature 97.8 F (36.6 C), temperature source Oral, resp. rate 18, SpO2 99 %. Physical Exam: General: pleasant, obese white female who is laying in bed in NAD HEENT: head is normocephalic, atraumatic.  Sclera are noninjected.  PERRL.  Ears and nose without any masses or lesions.  Mouth is pink and moist Heart: regular, rate, and rhythm.  Normal s1,s2. No obvious murmurs, gallops, or rubs noted.  Palpable radial and pedal pulses bilaterally Lungs: CTAB, no wheezes, rhonchi, or rales noted.  Respiratory effort nonlabored Abd: soft, tender in RUQ with voluntary guarding, +Murphy's sign, ND, +BS, no masses, hernias, or organomegaly MS: all 4 extremities are symmetrical with no cyanosis, clubbing, or edema. Skin: warm and dry with no masses, lesions, or rashes Psych: A&Ox3 with an appropriate affect.    Results for orders placed or performed during the hospital encounter of 06/17/14 (from the past 48 hour(s))  Comprehensive  metabolic panel     Status: Abnormal   Collection Time: 06/17/14  7:50 AM  Result Value Ref Range   Sodium 141 135 - 145 mmol/L   Potassium 4.0 3.5 - 5.1 mmol/L   Chloride 105 96 - 112 mmol/L   CO2 24 19 - 32 mmol/L   Glucose, Bld 144 (H) 70 - 99 mg/dL   BUN 10 6 - 23 mg/dL   Creatinine, Ser 0.97 0.50 - 1.10 mg/dL   Calcium 9.4 8.4 - 10.5 mg/dL   Total Protein 7.1 6.0 - 8.3 g/dL   Albumin 3.9 3.5 - 5.2 g/dL   AST 26 0 - 37 U/L   ALT 20 0 - 35 U/L   Alkaline Phosphatase 74 39 - 117 U/L   Total Bilirubin 0.5 0.3 - 1.2 mg/dL   GFR calc non Af Amer 64 (L) >90 mL/min   GFR calc Af Amer 74 (L) >90 mL/min    Comment: (NOTE) The eGFR has been calculated using the CKD EPI equation. This calculation has not been validated in all clinical situations. eGFR's persistently <90 mL/min signify possible Chronic Kidney Disease.    Anion gap 12 5 - 15  CBC with Differential     Status: Abnormal   Collection Time: 06/17/14  7:50 AM  Result Value Ref Range   WBC 12.0 (H) 4.0 - 10.5 K/uL   RBC 4.74 3.87 - 5.11 MIL/uL   Hemoglobin 13.4 12.0 - 15.0 g/dL   HCT 39.8 36.0 - 46.0 %   MCV 84.0 78.0 - 100.0 fL   MCH 28.3 26.0 - 34.0 pg   MCHC 33.7 30.0 - 36.0 g/dL   RDW 13.4 11.5 - 15.5 %   Platelets 238 150 - 400 K/uL   Neutrophils Relative % 76 43 - 77 %   Neutro Abs 9.1 (H) 1.7 - 7.7 K/uL   Lymphocytes Relative 18 12 - 46 %   Lymphs Abs 2.2 0.7 - 4.0 K/uL   Monocytes Relative 5 3 - 12 %   Monocytes Absolute 0.6 0.1 - 1.0 K/uL   Eosinophils Relative 1 0 - 5 %   Eosinophils Absolute 0.1 0.0 - 0.7 K/uL   Basophils Relative 0 0 - 1 %   Basophils Absolute 0.1 0.0 - 0.1 K/uL  Troponin I     Status: None   Collection Time: 06/17/14  7:50 AM  Result Value Ref Range   Troponin I <0.03 <0.031 ng/mL    Comment:        NO INDICATION OF MYOCARDIAL INJURY.   Lipase, blood     Status: None   Collection Time: 06/17/14  7:50 AM  Result Value Ref Range   Lipase 31 11 - 59 U/L  Urinalysis, Routine w  reflex microscopic     Status: Abnormal   Collection Time:  06/17/14  9:50 AM  Result Value Ref Range   Color, Urine YELLOW YELLOW   APPearance CLEAR CLEAR   Specific Gravity, Urine 1.023 1.005 - 1.030   pH 5.5 5.0 - 8.0   Glucose, UA NEGATIVE NEGATIVE mg/dL   Hgb urine dipstick TRACE (A) NEGATIVE   Bilirubin Urine NEGATIVE NEGATIVE   Ketones, ur NEGATIVE NEGATIVE mg/dL   Protein, ur NEGATIVE NEGATIVE mg/dL   Urobilinogen, UA 0.2 0.0 - 1.0 mg/dL   Nitrite NEGATIVE NEGATIVE   Leukocytes, UA NEGATIVE NEGATIVE  Urine microscopic-add on     Status: None   Collection Time: 06/17/14  9:50 AM  Result Value Ref Range   RBC / HPF 0-2 <3 RBC/hpf  Basic metabolic panel     Status: Abnormal   Collection Time: 06/18/14  4:03 AM  Result Value Ref Range   Sodium 133 (L) 135 - 145 mmol/L    Comment: DELTA CHECK NOTED   Potassium 3.8 3.5 - 5.1 mmol/L   Chloride 102 96 - 112 mmol/L   CO2 22 19 - 32 mmol/L   Glucose, Bld 120 (H) 70 - 99 mg/dL   BUN 7 6 - 23 mg/dL   Creatinine, Ser 0.70 0.50 - 1.10 mg/dL   Calcium 9.0 8.4 - 10.5 mg/dL   GFR calc non Af Amer >90 >90 mL/min   GFR calc Af Amer >90 >90 mL/min    Comment: (NOTE) The eGFR has been calculated using the CKD EPI equation. This calculation has not been validated in all clinical situations. eGFR's persistently <90 mL/min signify possible Chronic Kidney Disease.    Anion gap 9 5 - 15  CBC     Status: Abnormal   Collection Time: 06/18/14  4:03 AM  Result Value Ref Range   WBC 14.1 (H) 4.0 - 10.5 K/uL   RBC 4.71 3.87 - 5.11 MIL/uL   Hemoglobin 13.2 12.0 - 15.0 g/dL   HCT 39.2 36.0 - 46.0 %   MCV 83.2 78.0 - 100.0 fL   MCH 28.0 26.0 - 34.0 pg   MCHC 33.7 30.0 - 36.0 g/dL   RDW 13.4 11.5 - 15.5 %   Platelets 265 150 - 400 K/uL   Ct Abdomen Pelvis W Contrast  06/17/2014   CLINICAL DATA:  Mid abdominal pain beginning last night.  EXAM: CT ABDOMEN AND PELVIS WITH CONTRAST  TECHNIQUE: Multidetector CT imaging of the abdomen and pelvis  was performed using the standard protocol following bolus administration of intravenous contrast.  CONTRAST:  38mL OMNIPAQUE IOHEXOL 300 MG/ML  SOLN  COMPARISON:  Pelvic ultrasound 07/03/2009  FINDINGS: Dependent atelectasis is present at both lung bases. Heart size is normal. No significant pleural or pericardial effusion is present.  There is probable fatty infiltration of the liver. No discrete lesions are seen. The spleen is within normal limits. Stomach, duodenum, and pancreas are within normal limits.  An 18 mm peripherally calcified stone is present at the fundus the gallbladder. There is no inflammation to suggest cholecystitis. The adrenal glands are normal bilaterally. Kidneys and ureters are within normal limits. Urinary bladder is unremarkable.  The rectosigmoid colon is within normal limits. A single calcified noted or calcification within and isolated diverticulum is noted adjacent to the sigmoid colon. No other significant adenopathy is present. Most of the colon is collapsed. The appendix is visualized and normal. Small bowel is unremarkable. There is scattered mild stranding within the small bowel mesenteries.  No significant free fluid is present. The patient is status post  hysterectomy. Neither ovary is evident in both are likely surgically excised.  Mild degenerative changes are present in the lower thoracic spine. No focal lytic or blastic lesions are present.  IMPRESSION: 1. Mild nonspecific stranding within the base of the small bowel mesenteries. This can be seen in the setting of a nonspecific enteritis. No focal obstruction or mass lesion is present. 2. Single 18 mm gallstone evident without evidence for cholecystitis. 3. Probable fatty infiltration of the liver.   Electronically Signed   By: San Morelle M.D.   On: 06/17/2014 13:02   Dg Chest Port 1 View  06/17/2014   CLINICAL DATA:  Abdominal and chest pain  EXAM: PORTABLE CHEST - 1 VIEW  COMPARISON:  None.  FINDINGS: Normal  heart size and mediastinal contours. No acute infiltrate or edema. No effusion or pneumothorax. No acute osseous findings.  IMPRESSION: Negative portable chest.   Electronically Signed   By: Monte Fantasia M.D.   On: 06/17/2014 07:58   US Abdomen Limited Ruq  06/17/2014   CLINICAL DATA:  Nausea, vomiting, epigastric pain since 5 a.m.  EXAM: US ABDOMEN LIMITED - RIGHT UPPER QUADRANT  COMPARISON:  None.  FINDINGS: Gallbladder:  Multiple gallstones, the largest 1.9 cm. Sludge within the gallbladder. No wall thickening. Negative sonographic Murphy's.  Common bile duct:  Diameter: Normal caliber, 6 mm  Liver:  No focal lesion identified. Within normal limits in parenchymal echogenicity.  IMPRESSION: Gallstones and sludge within the gallbladder. No current sonographic evidence of acute cholecystitis.   Electronically Signed   By: Rolm Baptise M.D.   On: 06/17/2014 14:23       Assessment/Plan 1. Biliary colic/? Early acute cholecystitis -the patient's history is consistent with biliary colic and likely some early cholecystitis given how much tenderness she is having.  She does have some occasional burning and episodes of "spitting up" that I do not think are related and may be secondary to untreated GERD.   -because of her pain and elevated leukocytosis, I will start her on IV Rocephin. -decrease diet to clear liquids and make NPO p MN with anticipation of lap chole tomorrow  - explained the procedure, risks, complications, and expected outcomes with the patient and her family.  They understand and are agreeable to proceed -patient will be transferred to our service.  Kedarius Aloisi E 06/18/2014, 2:00 PM Pager: [phone]

## 2014-06-19 ENCOUNTER — Encounter (HOSPITAL_COMMUNITY): Admission: EM | Disposition: A | Discharge status: Home / Self Care | Payer: Self-pay | Source: Home / Self Care

## 2014-06-19 ENCOUNTER — Inpatient Hospital Stay (HOSPITAL_COMMUNITY): Payer: Self-pay | Admitting: Anesthesiology

## 2014-06-19 ENCOUNTER — Inpatient Hospital Stay (HOSPITAL_COMMUNITY): Payer: Self-pay

## 2014-06-19 ENCOUNTER — Inpatient Hospital Stay (HOSPITAL_COMMUNITY): Payer: MEDICAID | Admitting: Anesthesiology

## 2014-06-19 HISTORY — PX: CHOLECYSTECTOMY: SHX55

## 2014-06-19 LAB — COMPREHENSIVE METABOLIC PANEL
ALT: 19 U/L (ref 0–35)
AST: 18 U/L (ref 0–37)
Albumin: 3.1 g/dL — ABNORMAL LOW (ref 3.5–5.2)
Alkaline Phosphatase: 70 U/L (ref 39–117)
Anion gap: 9 (ref 5–15)
BUN: <5 mg/dL — ABNORMAL LOW (ref 6–23)
CO2: 25 mmol/L (ref 19–32)
Calcium: 8.9 mg/dL (ref 8.4–10.5)
Chloride: 105 mmol/L (ref 96–112)
Creatinine, Ser: 0.83 mg/dL (ref 0.50–1.10)
GFR calc Af Amer: 90 mL/min — ABNORMAL LOW (ref >90)
GFR calc non Af Amer: 77 mL/min — ABNORMAL LOW (ref >90)
Glucose, Bld: 99 mg/dL (ref 70–99)
Potassium: 3.8 mmol/L (ref 3.5–5.1)
Sodium: 139 mmol/L (ref 135–145)
Total Bilirubin: 0.8 mg/dL (ref 0.3–1.2)
Total Protein: 6.3 g/dL (ref 6.0–8.3)

## 2014-06-19 LAB — CBC
HCT: 38.4 % (ref 36.0–46.0)
Hemoglobin: 12.6 g/dL (ref 12.0–15.0)
MCH: 28 pg (ref 26.0–34.0)
MCHC: 32.8 g/dL (ref 30.0–36.0)
MCV: 85.3 fL (ref 78.0–100.0)
Platelets: 219 10*3/uL (ref 150–400)
RBC: 4.5 MIL/uL (ref 3.87–5.11)
RDW: 13.7 % (ref 11.5–15.5)
WBC: 11 10*3/uL — ABNORMAL HIGH (ref 4.0–10.5)

## 2014-06-19 SURGERY — LAPAROSCOPIC CHOLECYSTECTOMY WITH INTRAOPERATIVE CHOLANGIOGRAM
Anesthesia: General | Site: Abdomen

## 2014-06-19 MED ORDER — MEPERIDINE HCL 25 MG/ML IJ SOLN: 6.2500 mg | INTRAMUSCULAR | Status: DC | PRN

## 2014-06-19 MED ORDER — SODIUM CHLORIDE 0.9 % IV SOLN
INTRAVENOUS | Status: DC | PRN
  Administered 2014-06-19: 8 mL

## 2014-06-19 MED ORDER — EPHEDRINE SULFATE 50 MG/ML IJ SOLN
INTRAMUSCULAR | Status: DC | PRN
  Administered 2014-06-19: 5 mg via INTRAVENOUS

## 2014-06-19 MED ORDER — MIDAZOLAM HCL 2 MG/2ML IJ SOLN
INTRAMUSCULAR | Status: AC
  Filled 2014-06-19: qty 2

## 2014-06-19 MED ORDER — LIDOCAINE HCL (CARDIAC) 20 MG/ML IV SOLN
INTRAVENOUS | Status: DC | PRN
  Administered 2014-06-19: 100 mg via INTRAVENOUS

## 2014-06-19 MED ORDER — SODIUM CHLORIDE 0.9 % IJ SOLN
INTRAMUSCULAR | Status: AC
Start: 2014-06-19 — End: 2014-06-19
  Filled 2014-06-19: qty 10

## 2014-06-19 MED ORDER — ONDANSETRON HCL 4 MG/2ML IJ SOLN
INTRAMUSCULAR | Status: DC | PRN
  Administered 2014-06-19: 4 mg via INTRAVENOUS

## 2014-06-19 MED ORDER — DEXAMETHASONE SODIUM PHOSPHATE 4 MG/ML IJ SOLN
INTRAMUSCULAR | Status: DC | PRN
  Administered 2014-06-19: 4 mg via INTRAVENOUS

## 2014-06-19 MED ORDER — ONDANSETRON HCL 4 MG/2ML IJ SOLN
INTRAMUSCULAR | Status: AC
  Filled 2014-06-19: qty 2

## 2014-06-19 MED ORDER — 0.9 % SODIUM CHLORIDE (POUR BTL) OPTIME
TOPICAL | Status: DC | PRN
  Administered 2014-06-19: 1000 mL

## 2014-06-19 MED ORDER — NEOSTIGMINE METHYLSULFATE 10 MG/10ML IV SOLN
INTRAVENOUS | Status: DC | PRN
  Administered 2014-06-19: 3 mg via INTRAVENOUS

## 2014-06-19 MED ORDER — HYDROMORPHONE HCL 1 MG/ML IJ SOLN
INTRAMUSCULAR | Status: AC
  Filled 2014-06-19: qty 1

## 2014-06-19 MED ORDER — GLYCOPYRROLATE 0.2 MG/ML IJ SOLN
INTRAMUSCULAR | Status: DC | PRN
  Administered 2014-06-19: .4 mg via INTRAVENOUS

## 2014-06-19 MED ORDER — PROMETHAZINE HCL 25 MG/ML IJ SOLN: 6.2500 mg | INTRAMUSCULAR | Status: DC | PRN

## 2014-06-19 MED ORDER — SCOPOLAMINE 1 MG/3DAYS TD PT72
MEDICATED_PATCH | TRANSDERMAL | Status: AC
  Administered 2014-06-19: 1 via TRANSDERMAL
  Filled 2014-06-19: qty 1

## 2014-06-19 MED ORDER — LACTATED RINGERS IV SOLN
INTRAVENOUS | Status: DC
  Administered 2014-06-19 (×2): via INTRAVENOUS

## 2014-06-19 MED ORDER — HYDROMORPHONE HCL 1 MG/ML IJ SOLN
0.2500 mg | INTRAMUSCULAR | Status: DC | PRN
  Administered 2014-06-19 (×2): 0.5 mg via INTRAVENOUS

## 2014-06-19 MED ORDER — PROPOFOL 10 MG/ML IV BOLUS
INTRAVENOUS | Status: AC
  Filled 2014-06-19: qty 20

## 2014-06-19 MED ORDER — FENTANYL CITRATE (PF) 250 MCG/5ML IJ SOLN
INTRAMUSCULAR | Status: AC
  Filled 2014-06-19: qty 5

## 2014-06-19 MED ORDER — EPHEDRINE SULFATE 50 MG/ML IJ SOLN
INTRAMUSCULAR | Status: AC
  Filled 2014-06-19: qty 1

## 2014-06-19 MED ORDER — PROPOFOL 10 MG/ML IV BOLUS
INTRAVENOUS | Status: DC | PRN
  Administered 2014-06-19: 150 mg via INTRAVENOUS

## 2014-06-19 MED ORDER — ROCURONIUM BROMIDE 100 MG/10ML IV SOLN
INTRAVENOUS | Status: DC | PRN
  Administered 2014-06-19: 40 mg via INTRAVENOUS

## 2014-06-19 MED ORDER — HEMOSTATIC AGENTS (NO CHARGE) OPTIME
TOPICAL | Status: DC | PRN
  Administered 2014-06-19: 1

## 2014-06-19 MED ORDER — LIDOCAINE HCL (CARDIAC) 20 MG/ML IV SOLN
INTRAVENOUS | Status: AC
  Filled 2014-06-19: qty 15

## 2014-06-19 MED ORDER — PHENYLEPHRINE HCL 10 MG/ML IJ SOLN
INTRAMUSCULAR | Status: DC | PRN
  Administered 2014-06-19: 160 ug via INTRAVENOUS
  Administered 2014-06-19 (×3): 80 ug via INTRAVENOUS

## 2014-06-19 MED ORDER — MIDAZOLAM HCL 5 MG/5ML IJ SOLN
INTRAMUSCULAR | Status: DC | PRN
  Administered 2014-06-19: 2 mg via INTRAVENOUS

## 2014-06-19 MED ORDER — SODIUM CHLORIDE 0.9 % IR SOLN
Status: DC | PRN
  Administered 2014-06-19: 1000 mL

## 2014-06-19 MED ORDER — MIDAZOLAM HCL 2 MG/2ML IJ SOLN: 0.5000 mg | Freq: Once | INTRAMUSCULAR | Status: DC | PRN

## 2014-06-19 MED ORDER — BUPIVACAINE-EPINEPHRINE 0.25% -1:200000 IJ SOLN
INTRAMUSCULAR | Status: DC | PRN
  Administered 2014-06-19: 12 mL

## 2014-06-19 MED ORDER — BUPIVACAINE-EPINEPHRINE (PF) 0.25% -1:200000 IJ SOLN
INTRAMUSCULAR | Status: AC
  Filled 2014-06-19: qty 30

## 2014-06-19 MED ORDER — FENTANYL CITRATE (PF) 100 MCG/2ML IJ SOLN
INTRAMUSCULAR | Status: DC | PRN
  Administered 2014-06-19 (×2): 50 ug via INTRAVENOUS
  Administered 2014-06-19: 100 ug via INTRAVENOUS
  Administered 2014-06-19: 50 ug via INTRAVENOUS

## 2014-06-19 SURGICAL SUPPLY — 45 items
APPLIER CLIP ROT 10 11.4 M/L (STAPLE) ×2
BENZOIN TINCTURE PRP APPL 2/3 (GAUZE/BANDAGES/DRESSINGS) ×2 IMPLANT
BLADE SURG ROTATE 9660 (MISCELLANEOUS) IMPLANT
CANISTER SUCTION 2500CC (MISCELLANEOUS) ×2 IMPLANT
CHLORAPREP W/TINT 26ML (MISCELLANEOUS) ×2 IMPLANT
CLIP APPLIE ROT 10 11.4 M/L (STAPLE) ×1 IMPLANT
COVER MAYO STAND STRL (DRAPES) ×2 IMPLANT
COVER SURGICAL LIGHT HANDLE (MISCELLANEOUS) ×2 IMPLANT
DRAPE C-ARM 42X72 X-RAY (DRAPES) ×2 IMPLANT
DRAPE LAPAROSCOPIC ABDOMINAL (DRAPES) ×2 IMPLANT
DRSG TEGADERM 2-3/8X2-3/4 SM (GAUZE/BANDAGES/DRESSINGS) ×6 IMPLANT
DRSG TEGADERM 4X4.75 (GAUZE/BANDAGES/DRESSINGS) ×2 IMPLANT
ELECT REM PT RETURN 9FT ADLT (ELECTROSURGICAL) ×2
ELECTRODE REM PT RTRN 9FT ADLT (ELECTROSURGICAL) ×1 IMPLANT
FILTER SMOKE EVAC LAPAROSHD (FILTER) ×2 IMPLANT
GAUZE SPONGE 2X2 8PLY STRL LF (GAUZE/BANDAGES/DRESSINGS) ×1 IMPLANT
GLOVE BIO SURGEON STRL SZ7 (GLOVE) ×4 IMPLANT
GLOVE BIOGEL PI IND STRL 7.0 (GLOVE) ×1 IMPLANT
GLOVE BIOGEL PI IND STRL 7.5 (GLOVE) ×2 IMPLANT
GLOVE BIOGEL PI INDICATOR 7.0 (GLOVE) ×1
GLOVE BIOGEL PI INDICATOR 7.5 (GLOVE) ×2
GLOVE SURG SS PI 7.0 STRL IVOR (GLOVE) ×4 IMPLANT
GOWN STRL REUS W/ TWL LRG LVL3 (GOWN DISPOSABLE) ×4 IMPLANT
GOWN STRL REUS W/TWL LRG LVL3 (GOWN DISPOSABLE) ×4
HEMOSTAT SNOW SURGICEL 2X4 (HEMOSTASIS) ×2 IMPLANT
KIT BASIN OR (CUSTOM PROCEDURE TRAY) ×2 IMPLANT
KIT ROOM TURNOVER OR (KITS) ×2 IMPLANT
NS IRRIG 1000ML POUR BTL (IV SOLUTION) ×2 IMPLANT
PAD ARMBOARD 7.5X6 YLW CONV (MISCELLANEOUS) ×2 IMPLANT
POUCH SPECIMEN RETRIEVAL 10MM (ENDOMECHANICALS) ×2 IMPLANT
SCISSORS LAP 5X35 DISP (ENDOMECHANICALS) ×2 IMPLANT
SET CHOLANGIOGRAPH 5 50 .035 (SET/KITS/TRAYS/PACK) ×2 IMPLANT
SET IRRIG TUBING LAPAROSCOPIC (IRRIGATION / IRRIGATOR) ×2 IMPLANT
SLEEVE ENDOPATH XCEL 5M (ENDOMECHANICALS) ×2 IMPLANT
SPECIMEN JAR SMALL (MISCELLANEOUS) ×2 IMPLANT
SPONGE GAUZE 2X2 STER 10/PKG (GAUZE/BANDAGES/DRESSINGS) ×1
STRIP CLOSURE SKIN 1/2X4 (GAUZE/BANDAGES/DRESSINGS) ×2 IMPLANT
SUT MNCRL AB 4-0 PS2 18 (SUTURE) ×2 IMPLANT
TOWEL OR 17X24 6PK STRL BLUE (TOWEL DISPOSABLE) ×2 IMPLANT
TOWEL OR 17X26 10 PK STRL BLUE (TOWEL DISPOSABLE) IMPLANT
TRAY LAPAROSCOPIC (CUSTOM PROCEDURE TRAY) ×2 IMPLANT
TROCAR XCEL BLUNT TIP 100MML (ENDOMECHANICALS) ×2 IMPLANT
TROCAR XCEL NON-BLD 11X100MML (ENDOMECHANICALS) ×2 IMPLANT
TROCAR XCEL NON-BLD 5MMX100MML (ENDOMECHANICALS) ×2 IMPLANT
TUBING INSUFFLATION (TUBING) ×2 IMPLANT

## 2014-06-19 NOTE — Anesthesia Postprocedure Evaluation (Signed)
  Anesthesia Post-op Note  Patient: Patricia Ferrell  Procedure(s) Performed: Procedure(s): LAPAROSCOPIC CHOLECYSTECTOMY WITH INTRAOPERATIVE CHOLANGIOGRAM (N/A)  Patient Location: PACU  Anesthesia Type:General  Level of Consciousness: awake  Airway and Oxygen Therapy: Patient Spontanous Breathing  Post-op Pain: mild  Post-op Assessment: Post-op Vital signs reviewed  Post-op Vital Signs: Reviewed  Last Vitals:  Filed Vitals:   06/19/14 1345  BP: 130/73  Pulse: 72  Temp:   Resp: 13    Complications: No apparent anesthesia complications

## 2014-06-19 NOTE — Progress Notes (Signed)
One pair of yellow colored, clear stoned earrings given to Robin, pt's sister in a labelled specimen cup.

## 2014-06-19 NOTE — Op Note (Signed)
Laparoscopic Cholecystectomy with IOC Procedure Note  Indications: This patient presents with symptomatic gallbladder disease and will undergo laparoscopic cholecystectomy.  Pre-operative Diagnosis: Calculus of gallbladder with acute cholecystitis, without mention of obstruction  Post-operative Diagnosis: Same  Surgeon: Sahvannah Rieser K.   Assistants: Dr. Emelia LoronMatthew Wakefield  Anesthesia: General endotracheal anesthesia  ASA Class: 2  Procedure Details  The patient was seen again in the Holding Room. The risks, benefits, complications, treatment options, and expected outcomes were discussed with the patient. The possibilities of reaction to medication, pulmonary aspiration, perforation of viscus, bleeding, recurrent infection, finding a normal gallbladder, the need for additional procedures, failure to diagnose a condition, the possible need to convert to an open procedure, and creating a complication requiring transfusion or operation were discussed with the patient. The likelihood of improving the patient's symptoms with return to their baseline status is good.  The patient and/or family concurred with the proposed plan, giving informed consent. The site of surgery properly noted. The patient was taken to Operating Room, identified as Patricia Ferrell and the procedure verified as Laparoscopic Cholecystectomy with Intraoperative Cholangiogram. A Time Out was held and the above information confirmed.  Prior to the induction of general anesthesia, antibiotic prophylaxis was administered. General endotracheal anesthesia was then administered and tolerated well. After the induction, the abdomen was prepped with Chloraprep and draped in the sterile fashion. The patient was positioned in the supine position.  Local anesthetic agent was injected into the skin near the umbilicus and an incision made. We dissected down to the abdominal fascia with blunt dissection.  The fascia was incised vertically and  we entered the peritoneal cavity bluntly.  A pursestring suture of 0-Vicryl was placed around the fascial opening.  The Hasson cannula was inserted and secured with the stay suture.  Pneumoperitoneum was then created with CO2 and tolerated well without any adverse changes in the patient's vital signs. An 11-mm port was placed in the subxiphoid position.  Two 5-mm ports were placed in the right upper quadrant. All skin incisions were infiltrated with a local anesthetic agent before making the incision and placing the trocars.   We positioned the patient in reverse Trendelenburg, tilted slightly to the patient's left.  The gallbladder was identified and was very thickened and edematous.  The gallbladder was decompressed with the suction aspirator.  The fundus was grasped and retracted cephalad. Adhesions were lysed bluntly and with the electrocautery where indicated, taking care not to injure any adjacent organs or viscus. The infundibulum was grasped and retracted laterally, exposing the peritoneum overlying the triangle of Calot. This was then divided and exposed in a blunt fashion. The common bile duct is tented up towards the cystic duct.  However, we were able to identify the cystic duct coming of the common bile duct.  A critical view of the cystic duct and cystic artery was obtained.  The cystic duct was clearly identified and bluntly dissected circumferentially. The cystic duct was ligated with a clip distally.   An incision was made in the cystic duct and the [facility] introduced. The catheter was secured using a clip. A cholangiogram was then obtained which showed good visualization of the distal and proximal biliary tree with no sign of filling defects or obstruction.  Contrast flowed easily into the duodenum. The catheter was then removed.   The cystic duct was then ligated with clips and divided. The cystic artery was identified, dissected free, ligated with clips and divided as  well.  The gallbladder was dissected from the liver bed in retrograde fashion with the electrocautery. The gallbladder was removed and placed in an Endocatch sac. The liver bed was irrigated and inspected. Hemostasis was achieved with the electrocautery and Surgicel SNOW. Copious irrigation was utilized and was repeatedly aspirated until clear.  The gallbladder and Endocatch sac were then removed through the umbilical port site.  The pursestring suture was used to close the umbilical fascia.    We again inspected the right upper quadrant for hemostasis.  Pneumoperitoneum was released as we removed the trocars.  4-0 Monocryl was used to close the skin.   Benzoin, steri-strips, and clean dressings were applied. The patient was then extubated and brought to the recovery room in stable condition. Instrument, sponge, and needle counts were correct at closure and at the conclusion of the case.   Findings: Cholecystitis with Cholelithiasis  Estimated Blood Loss: 100 ml         Drains: none         Specimens: Gallbladder           Complications: None; patient tolerated the procedure well.         Disposition: PACU - hemodynamically stable.           Condition: stable  Wilmon Arms. Corliss Skains, MD, [facility]  General/ Trauma Surgery  06/19/2014 12:58 PM

## 2014-06-19 NOTE — Transfer of Care (Signed)
Immediate Anesthesia Transfer of Care Note  Patient: Patricia Ferrell  Procedure(s) Performed: Procedure(s): LAPAROSCOPIC CHOLECYSTECTOMY WITH INTRAOPERATIVE CHOLANGIOGRAM (N/A)  Patient Location: PACU  Anesthesia Type:General  Level of Consciousness: awake, alert  and oriented  Airway & Oxygen Therapy: Patient Spontanous Breathing and Patient connected to nasal cannula oxygen  Post-op Assessment: Report given to RN and Post -op Vital signs reviewed and stable  Post vital signs: Reviewed and stable  Last Vitals:  Filed Vitals:   06/19/14 0609  BP: 111/75  Pulse: 84  Temp: 37.1 C  Resp: 18    Complications: No apparent anesthesia complications

## 2014-06-19 NOTE — Anesthesia Procedure Notes (Signed)
Procedure Name: Intubation Performed by: Marena ChancyBECKNER, Lynniah Janoski S Patient Re-evaluated:Patient Re-evaluated prior to inductionOxygen Delivery Method: Circle system utilized Preoxygenation: Pre-oxygenation with 100% oxygen Intubation Type: IV induction Ventilation: Mask ventilation without difficulty Laryngoscope Size: Miller and 2 Grade View: Grade I Tube type: Oral Tube size: 7.5 mm Number of attempts: 1 Placement Confirmation: breath sounds checked- equal and bilateral,  ETT inserted through vocal cords under direct vision and positive ETCO2 Tube secured with: Tape

## 2014-06-19 NOTE — Anesthesia Preprocedure Evaluation (Addendum)
Anesthesia Evaluation  Patient identified by MRN, date of birth, ID band Patient awake    Reviewed: Allergy & Precautions, NPO status , Patient's Chart, lab work & pertinent test results  Airway Mallampati: I  TM Distance: >3 FB Neck ROM: Full    Dental  (+) Teeth Intact   Pulmonary COPDformer smoker,  breath sounds clear to auscultation        Cardiovascular Rhythm:Regular     Neuro/Psych    GI/Hepatic GERD-  Medicated,  Endo/Other    Renal/GU      Musculoskeletal   Abdominal   Peds  Hematology   Anesthesia Other Findings   Reproductive/Obstetrics                           Anesthesia Physical Anesthesia Plan  ASA: II  Anesthesia Plan: General   Post-op Pain Management:    Induction: Intravenous  Airway Management Planned: Oral ETT  Additional Equipment:   Intra-op Plan:   Post-operative Plan: Extubation in OR  Informed Consent: I have reviewed the patients History and Physical, chart, labs and discussed the procedure including the risks, benefits and alternatives for the proposed anesthesia with the patient or authorized representative who has indicated his/her understanding and acceptance.   Dental advisory given  Plan Discussed with: Anesthesiologist, Surgeon and CRNA  Anesthesia Plan Comments:         Anesthesia Quick Evaluation

## 2014-06-20 ENCOUNTER — Encounter (HOSPITAL_COMMUNITY): Payer: Self-pay | Admitting: General Practice

## 2014-06-20 DIAGNOSIS — K8000 Calculus of gallbladder with acute cholecystitis without obstruction: Secondary | ICD-10-CM | POA: Diagnosis present

## 2014-06-20 LAB — CBC
HCT: 35.8 % — ABNORMAL LOW (ref 36.0–46.0)
Hemoglobin: 11.9 g/dL — ABNORMAL LOW (ref 12.0–15.0)
MCH: 28 pg (ref 26.0–34.0)
MCHC: 33.2 g/dL (ref 30.0–36.0)
MCV: 84.2 fL (ref 78.0–100.0)
Platelets: 225 10*3/uL (ref 150–400)
RBC: 4.25 MIL/uL (ref 3.87–5.11)
RDW: 13.6 % (ref 11.5–15.5)
WBC: 11.7 10*3/uL — ABNORMAL HIGH (ref 4.0–10.5)

## 2014-06-20 LAB — COMPREHENSIVE METABOLIC PANEL
ALT: 46 U/L — ABNORMAL HIGH (ref 0–35)
AST: 49 U/L — ABNORMAL HIGH (ref 0–37)
Albumin: 2.9 g/dL — ABNORMAL LOW (ref 3.5–5.2)
Alkaline Phosphatase: 65 U/L (ref 39–117)
Anion gap: 8 (ref 5–15)
BUN: 7 mg/dL (ref 6–23)
CO2: 26 mmol/L (ref 19–32)
Calcium: 8.8 mg/dL (ref 8.4–10.5)
Chloride: 104 mmol/L (ref 96–112)
Creatinine, Ser: 0.7 mg/dL (ref 0.50–1.10)
GFR calc Af Amer: >90 mL/min (ref >90)
GFR calc non Af Amer: >90 mL/min (ref >90)
Glucose, Bld: 103 mg/dL — ABNORMAL HIGH (ref 70–99)
Potassium: 3.8 mmol/L (ref 3.5–5.1)
Sodium: 138 mmol/L (ref 135–145)
Total Bilirubin: 0.6 mg/dL (ref 0.3–1.2)
Total Protein: 5.9 g/dL — ABNORMAL LOW (ref 6.0–8.3)

## 2014-06-20 LAB — URINALYSIS, ROUTINE W REFLEX MICROSCOPIC
Bilirubin Urine: NEGATIVE
Glucose, UA: NEGATIVE mg/dL
Hgb urine dipstick: NEGATIVE
Ketones, ur: 15 mg/dL — AB
Leukocytes, UA: NEGATIVE
Nitrite: NEGATIVE
Protein, ur: NEGATIVE mg/dL
Specific Gravity, Urine: 1.011 (ref 1.005–1.030)
Urobilinogen, UA: 0.2 mg/dL (ref 0.0–1.0)
pH: 6 (ref 5.0–8.0)

## 2014-06-20 MED ORDER — HYDROCODONE-ACETAMINOPHEN 5-325 MG PO TABS: 1.0000 | ORAL_TABLET | ORAL | Status: DC | PRN

## 2014-06-20 MED ORDER — ENOXAPARIN SODIUM 40 MG/0.4ML ~~LOC~~ SOLN
40.0000 mg | Freq: Every day | SUBCUTANEOUS | Status: DC
  Administered 2014-06-20 – 2014-06-21 (×2): 40 mg via SUBCUTANEOUS
  Filled 2014-06-20 (×2): qty 0.4

## 2014-06-20 MED ORDER — FENTANYL CITRATE (PF) 100 MCG/2ML IJ SOLN
25.0000 ug | INTRAMUSCULAR | Status: DC | PRN
  Administered 2014-06-20 (×3): 50 ug via INTRAVENOUS
  Filled 2014-06-20 (×3): qty 2

## 2014-06-20 MED ORDER — TRAMADOL HCL 50 MG PO TABS: 50.0000 mg | ORAL_TABLET | Freq: Four times a day (QID) | ORAL | Status: DC | PRN

## 2014-06-20 MED ORDER — TRAMADOL HCL 50 MG PO TABS
50.0000 mg | ORAL_TABLET | Freq: Four times a day (QID) | ORAL | Status: DC | PRN
  Administered 2014-06-20 – 2014-06-21 (×2): 100 mg via ORAL
  Filled 2014-06-20 (×2): qty 2

## 2014-06-20 NOTE — Progress Notes (Signed)
1 Day Post-Op  Subjective: Patient is quite tender on right side/ right flank Nauseated  Objective: Vital signs in last 24 hours: Temp:  [97.7 F (36.5 C)-98.3 F (36.8 C)] 98 F (36.7 C) (04/29 0600) Pulse Rate:  [66-108] 74 (04/29 0600) Resp:  [13-20] 20 (04/29 0600) BP: (101-130)/(66-79) 101/66 mmHg (04/29 0600) SpO2:  [93 %-100 %] 96 % (04/29 0600) Last BM Date: 06/17/14  Intake/Output from previous day: 04/28 0701 - 04/29 0700 In: 1270 [P.O.:120; I.V.:1150] Out: -  Intake/Output this shift:    General appearance: alert, cooperative and moderate distress GI: Tender RUQ/ R flank; no palpable masses; no peritonitis Dressings c/d/i; some staining at umbilical dressing but now dry.  Lab Results:   Recent Labs  06/19/14 0520 06/20/14 0445  WBC 11.0* 11.7*  HGB 12.6 11.9*  HCT 38.4 35.8*  PLT 219 225   BMET  Recent Labs  06/19/14 0520 06/20/14 0415  NA 139 138  K 3.8 3.8  CL 105 104  CO2 25 26  GLUCOSE 99 103*  BUN <5* 7  CREATININE 0.83 0.70  CALCIUM 8.9 8.8   PT/INR No results for input(s): LABPROT, INR in the last 72 hours. ABG No results for input(s): PHART, HCO3 in the last 72 hours.  Invalid input(s): PCO2, PO2  Studies/Results: Dg Cholangiogram Operative  06/19/2014   CLINICAL DATA:  Intraoperative cholangiogram during laparoscopic cholecystectomy.  EXAM: INTRAOPERATIVE CHOLANGIOGRAM  FLUOROSCOPY TIME:  6 seconds  COMPARISON:  CT abdomen pelvis - 06/17/2014; abdominal ultrasound - 06/17/2014  FINDINGS: Intraoperative cholangiographic images of the right upper abdominal quadrant during laparoscopic cholecystectomy are provided for review.  Surgical clips overlie the expected location of the gallbladder fossa.  Contrast injection demonstrates selective cannulation of the central aspect of the cystic duct.  There is passage of contrast through the central aspect of the cystic duct with filling of a non dilated common bile duct. There is passage of  contrast though the CBD and into the descending portion of the duodenum.  There is minimal reflux of injected contrast into the common hepatic duct and central aspect of the non dilated intrahepatic biliary system.  There are no discrete filling defects within the opacified portions of the biliary system to suggest the presence of choledocholithiasis.  IMPRESSION: No evidence of choledocholithiasis.   Electronically Signed   By: Simonne ComeJohn  Watts M.D.   On: 06/19/2014 13:36    Anti-infectives: Anti-infectives    Start     Dose/Rate Route Frequency Ordered Stop   06/18/14 1500  cefTRIAXone (ROCEPHIN) 2 g in dextrose 5 % 50 mL IVPB - Premix     2 g 100 mL/hr over 30 Minutes Intravenous Every 24 hours 06/18/14 1357        Assessment/Plan: s/p Procedure(s): LAPAROSCOPIC CHOLECYSTECTOMY WITH INTRAOPERATIVE CHOLANGIOGRAM (N/A) Plan for discharge tomorrow Will add Toradol for pain control.  patient unable to take PO narcotics because "it makes her sick on her stomach".    LOS: 2 days    Reni Hausner K. 06/20/2014

## 2014-06-20 NOTE — Discharge Summary (Signed)
[facility] Discharge Summary   Patient ID: Patricia GraysKathy D Ferrell MRN: [id number] DOB/AGE: May 08, 1955 59 y.o.  Admit date: 06/17/2014 Discharge date: 06/21/2014  Admitting Diagnosis: Symptomatic cholelithiasis   Discharge Diagnosis Patient Active Problem List   Diagnosis Date Noted  . Calculus of gallbladder with acute cholecystitis without obstruction 06/20/2014  . Nausea and vomiting 06/18/2014  . Abdominal pain 06/17/2014  . Nausea & vomiting 06/17/2014  . Diarrhea 06/17/2014  . ALLERGIC RHINITIS 09/08/2008  . ANXIETY 09/02/2008  . HYPERLIPIDEMIA 02/18/2008  . 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 PERIMENOPAUSAL SYNDROME 02/06/2008  . CARPAL TUNNEL SYNDROME, BILATERAL, HX OF 02/06/2008  . ASTHMA 08/21/2007  . SEBORRHEIC KERATOSIS 08/21/2007  . DISC DISEASE, LUMBAR 07/25/2007  . EXTRINSIC ASTHMA, UNSPECIFIED 05/04/2007    Consultants None  Imaging: Dg Cholangiogram Operative  06/19/2014   CLINICAL DATA:  Intraoperative cholangiogram during laparoscopic cholecystectomy.  EXAM: INTRAOPERATIVE CHOLANGIOGRAM  FLUOROSCOPY TIME:  6 seconds  COMPARISON:  CT abdomen pelvis - 06/17/2014; abdominal ultrasound - 06/17/2014  FINDINGS: Intraoperative cholangiographic images of the right upper abdominal quadrant during laparoscopic cholecystectomy are provided for review.  Surgical clips overlie the expected location of the gallbladder fossa.  Contrast injection demonstrates selective cannulation of the central aspect of the cystic duct.  There is passage of contrast through the central aspect of the cystic duct with filling of a non dilated common bile duct. There is passage of contrast though the CBD and into the descending portion of the duodenum.  There is minimal reflux of injected contrast into the common hepatic duct and central aspect of the non dilated intrahepatic biliary system.  There are no discrete filling defects within the opacified portions of the biliary  system to suggest the presence of choledocholithiasis.  IMPRESSION: No evidence of choledocholithiasis.   Electronically Signed   By: Simonne ComeJohn  Watts M.D.   On: 06/19/2014 13:36    Procedures Dr. Corliss Skainssuei (06/19/14) - [facility]  [facility] Course:  59 yo white female with no significant past medical history who was admitted yesterday with N/V/D/abdominal pain. The patient developed these symptoms yesterday morning at 0500am after eating chocolate and chocolate cake the night before. She developed epigastric pain that radiated to her back and her chest. She then developed nausea/vomiting/diarrhea. She denies any fevers. She has had 2-3 prior episodes of this same pain, but a little less severe within the last 2 weeks. Each time she is awakened around 0500am after eating chocolate and chocolate cake the night before. Those episodes past within a couple of hours, but this one did not. She came to the ED and had multiple labs and test that revealed a WBC of 12K, CT scan that revealed a gallstone, ? Some mesenteric edema, maybe enteritis, and an US that revealed gallstones and sludge. She was admitted for symptom control. Today she states her pain is worsened even with liquids. Her pain is more focally in the RUQ today. We have been asked to see her for further evaluation.  Workup showed symptomatic cholelithiasis with concerns for acute cholecystitis.  Patient was admitted and underwent procedure listed above.  Tolerated procedure well and was transferred to the floor.  She did have significant post operative pain and nausea and was not able to go home on POD #1.  Diet was eventually advanced as tolerated.  On POD #2, the patient was voiding well, tolerating diet, ambulating well, pain well controlled, vital signs stable, incisions c/d/i and felt stable for discharge home.  Patient will  follow up in our office in 3 weeks and knows to call with questions or concerns.   Physical  Exam:       Medication List    STOP taking these medications        cyclobenzaprine 5 MG tablet  Commonly known as:  FLEXERIL     predniSONE 20 MG tablet  Commonly known as:  DELTASONE      TAKE these medications        aspirin 81 MG EC tablet  Take 81 mg by mouth daily. Swallow whole.     diazepam 5 MG tablet  Commonly known as:  VALIUM  Take 5 mg by mouth daily as needed for anxiety.     omeprazole 40 MG capsule  Commonly known as:  PRILOSEC  Take 1 capsule (40 mg total) by mouth daily.     traMADol 50 MG tablet  Commonly known as:  ULTRAM  Take 1-2 tablets (50-100 mg total) by mouth every 6 (six) hours as needed for moderate pain or severe pain.         Follow-up Information    Follow up with [facility] [place] On 07/08/2014.   Why:  For post-operation check.Your appointment is at 2:15pm, please arrive at least 30 min before your appointment to complete your check in paperwork.  If you are unable to arrive 30 min prior to your appointment time we may have to cancel or reschedule you.   Contact information:   [address] [place] [address] [phone]      Signed: [facility] [phone]  06/21/2014

## 2014-06-20 NOTE — Discharge Instructions (Signed)

## 2014-06-20 NOTE — Progress Notes (Deleted)
[facility] Progress Note  1 Day Post-Op  Subjective: Pt was doing fine when I rounded this am, but now having worse pain and nausea since we d/c'ed the fentanyl.  Some nausea.  Hasn't been OOB much yet.    Objective: Vital signs in last 24 hours: Temp:  [97.7 F (36.5 C)-98.3 F (36.8 C)] 98 F (36.7 C) (04/29 0600) Pulse Rate:  [66-108] 74 (04/29 0600) Resp:  [13-20] 20 (04/29 0600) BP: (101-130)/(66-79) 101/66 mmHg (04/29 0600) SpO2:  [93 %-100 %] 96 % (04/29 0600) Last BM Date: 06/17/14  Intake/Output from previous day: 04/28 0701 - 04/29 0700 In: 1270 [P.O.:120; I.V.:1150] Out: -  Intake/Output this shift:    PE: Gen:  Alert, NAD, pleasant Abd: Soft, appropriate tenderness, +BS, no HSM, incisions C/D/I other than umbilical incision with sanguinous drainage   Lab Results:   Recent Labs  06/19/14 0520 06/20/14 0445  WBC 11.0* 11.7*  HGB 12.6 11.9*  HCT 38.4 35.8*  PLT 219 225   BMET  Recent Labs  06/19/14 0520 06/20/14 0415  NA 139 138  K 3.8 3.8  CL 105 104  CO2 25 26  GLUCOSE 99 103*  BUN <5* 7  CREATININE 0.83 0.70  CALCIUM 8.9 8.8   PT/INR No results for input(s): LABPROT, INR in the last 72 hours. CMP     Component Value Date/Time   NA 138 06/20/2014 0415   K 3.8 06/20/2014 0415   CL 104 06/20/2014 0415   CO2 26 06/20/2014 0415   GLUCOSE 103* 06/20/2014 0415   BUN 7 06/20/2014 0415   CREATININE 0.70 06/20/2014 0415   CALCIUM 8.8 06/20/2014 0415   PROT 5.9* 06/20/2014 0415   ALBUMIN 2.9* 06/20/2014 0415   AST 49* 06/20/2014 0415   ALT 46* 06/20/2014 0415   ALKPHOS 65 06/20/2014 0415   BILITOT 0.6 06/20/2014 0415   GFRNONAA >90 06/20/2014 0415   GFRAA >90 06/20/2014 0415   Lipase     Component Value Date/Time   LIPASE 31 06/17/2014 0750       Studies/Results: Dg Cholangiogram Operative  06/19/2014   CLINICAL DATA:  Intraoperative cholangiogram during laparoscopic cholecystectomy.  EXAM: INTRAOPERATIVE  CHOLANGIOGRAM  FLUOROSCOPY TIME:  6 seconds  COMPARISON:  CT abdomen pelvis - 06/17/2014; abdominal ultrasound - 06/17/2014  FINDINGS: Intraoperative cholangiographic images of the right upper abdominal quadrant during laparoscopic cholecystectomy are provided for review.  Surgical clips overlie the expected location of the gallbladder fossa.  Contrast injection demonstrates selective cannulation of the central aspect of the cystic duct.  There is passage of contrast through the central aspect of the cystic duct with filling of a non dilated common bile duct. There is passage of contrast though the CBD and into the descending portion of the duodenum.  There is minimal reflux of injected contrast into the common hepatic duct and central aspect of the non dilated intrahepatic biliary system.  There are no discrete filling defects within the opacified portions of the biliary system to suggest the presence of choledocholithiasis.  IMPRESSION: No evidence of choledocholithiasis.   Electronically Signed   By: Simonne ComeJohn  Watts M.D.   On: 06/19/2014 13:36    Anti-infectives: Anti-infectives    Start     Dose/Rate Route Frequency Ordered Stop   06/18/14 1500  cefTRIAXone (ROCEPHIN) 2 g in dextrose 5 % 50 mL IVPB - Premix     2 g 100 mL/hr over 30 Minutes Intravenous Every 24 hours 06/18/14 1357  Assessment/Plan Cholecystitis with cholelithiasis POD #1 s/p lap chole (Dr. Corliss Skains) -Pt was doing well this am when I originally rounded, but now she's having worse pain and nausea -IVF, pain control, antiemetics -Ambulate and IS -SCD's and start lovenox -May need to stay overnight    LOS: 2 days    Aris Georgia 06/20/2014, 10:23 AM Pager: [phone]

## 2014-06-21 NOTE — Progress Notes (Signed)
DC instructions and prescription given to patient and her daughter. All questions answered. [facility] and assisted into daughter's personal vehicle. Daughter will transport home.

## 2014-07-04 ENCOUNTER — Encounter: Payer: Self-pay | Admitting: *Deleted

## 2014-07-16 ENCOUNTER — Other Ambulatory Visit: Payer: Self-pay | Admitting: Family Medicine

## 2014-10-02 ENCOUNTER — Encounter: Payer: Self-pay | Admitting: Family Medicine

## 2014-10-02 ENCOUNTER — Ambulatory Visit (INDEPENDENT_AMBULATORY_CARE_PROVIDER_SITE_OTHER): Payer: Self-pay | Admitting: Family Medicine

## 2014-10-02 VITALS — BP 102/78 | HR 95 | Temp 97.9°F | Ht 65.0 in | Wt 172.6 lb

## 2014-10-02 DIAGNOSIS — M5441 Lumbago with sciatica, right side: Secondary | ICD-10-CM

## 2014-10-02 MED ORDER — IBUPROFEN 600 MG PO TABS: 600.0000 mg | ORAL_TABLET | Freq: Three times a day (TID) | ORAL | Status: DC | PRN

## 2014-10-02 NOTE — Progress Notes (Signed)
HPI:  Low back pain: -hx of low back pain with intermittent flares in pain a few times per year for > 10 years -reports has DDD and had xrays remotely -this flare started a few weeks ago and feels the same -pain is moderate in the R low back, sometimes radiates to R buttock and leg -deines: fevers, malaise, weight loss, bowel issues, urinary issues, weakness or numbness, bowel or bladder incontinence -she started a course of prednisone a few days ago - had on hand as this is usually what her PCP does for this (40 mg for 3 days) -she has tramadol and flexeril for this too -now feeling a little better; reports Dr. Tawanna Cooler gives her naprosyn for this and she wants a refill on this as it works better then the tramadol -reviewed recent PCP notes regarding this  ROS: See pertinent positives and negatives per HPI.  Past Medical History  Diagnosis Date  . Asthma   . Back pain     lumbar  . Perimenopausal   . Carpal tunnel syndrome   . Hyperlipidemia   . Complication of anesthesia   . PONV (postoperative nausea and vomiting)   . Anxiety     Past Surgical History  Procedure Laterality Date  . Oophorectomy  02/21/94    left  . Abdominal hysterectomy      DUB  . Cholecystectomy  06/19/2014  . Cholecystectomy N/A 06/19/2014    Procedure: LAPAROSCOPIC CHOLECYSTECTOMY WITH INTRAOPERATIVE CHOLANGIOGRAM;  Surgeon: Manus Rudd, MD;  Location: [facility] OR;  Service: General;  Laterality: N/A;    Family History  Problem Relation Age of Onset  . Coronary artery disease Mother   . Heart attack Mother   . Hypertension Mother   . Colon polyps Father     Social History   Social History  . Marital Status: Married    Spouse Name: N/A  . Number of Children: N/A  . Years of Education: N/A   Social History Main Topics  . Smoking status: Former Games developer  . Smokeless tobacco: Never Used  . Alcohol Use: 0.0 oz/week    0 Standard drinks or equivalent per week     Comment: occasional  . Drug Use: No   . Sexual Activity: No   Other Topics Concern  . None   Social History Narrative     Current outpatient prescriptions:  .  cyclobenzaprine (FLEXERIL) 5 MG tablet, Take 5 mg by mouth as needed for muscle spasms., Disp: , Rfl:  .  diazepam (VALIUM) 5 MG tablet, Take 5 mg by mouth daily as needed for anxiety. , Disp: , Rfl:  .  omeprazole (PRILOSEC) 40 MG capsule, TAKE 1 CAPSULE BY MOUTH DAILY, Disp: 90 capsule, Rfl: 0 .  traMADol (ULTRAM) 50 MG tablet, Take 1-2 tablets (50-100 mg total) by mouth every 6 (six) hours as needed for moderate pain or severe pain., Disp: 40 tablet, Rfl: 0 .  ibuprofen (ADVIL,MOTRIN) 600 MG tablet, Take 1 tablet (600 mg total) by mouth every 8 (eight) hours as needed., Disp: 15 tablet, Rfl: 0  EXAM:  Filed Vitals:   10/02/14 1448  BP: 102/78  Pulse: 95  Temp: 97.9 F (36.6 C)    Body mass index is 28.72 kg/(m^2).  GENERAL: vitals reviewed and listed above, alert, oriented, appears well hydrated and in no acute distress  HEENT: atraumatic, conjunttiva clear, no obvious abnormalities on inspection of external nose and ears  NECK: no obvious masses on inspection  LUNGS: clear to auscultation bilaterally,  no wheezes, rales or rhonchi, good air movement  CV: HRRR, no peripheral edema  Normal Gait Normal inspection of back, no obvious scoliosis or leg length descrepancy No bony TTP Soft tissue TTP at: R paraspinal muscle -/+ tests: neg trendelenburg,-facet loading, -SLRT, -CLRT, -FABER, -FADIR Normal muscle strength, sensation to light touch and DTRs in LEs bilaterally  MS: moves all extremities without noticeable abnormality  PSYCH: pleasant and cooperative, no obvious depression or anxiety  ASSESSMENT AND PLAN:  Discussed the following assessment and plan:  Right-sided low back pain with right-sided sciatica - Plan: ibuprofen (ADVIL,MOTRIN) 600 MG tablet  -per PCP note and pt, chronic and ongoing, same as in the past -tx per instructions  and orders -advised if not improving, does not resolve or worsening needs further eval with imaging and possibly specialist referral -follow up with pcp in 1 month -Patient advised to return or notify a doctor immediately if symptoms worsen or persist or new concerns arise.  Patient Instructions  BEFORE YOU LEAVE: -schedule follow up with Dr. Tawanna Cooler in 1 month  -low back exercises  Heat for 15 minutes twice daily  Do the exercises at least 4 days per week   2 more days of one tablet of prednisone then stop  Can use the flexeril at night and the ibuprofen or tramadol as needed for pain  Seek care immediately if weakness, numbness, fevers, loss of bowel or bladder control or other concerns     Patricia Ferrell, [name]

## 2014-10-02 NOTE — Patient Instructions (Signed)
BEFORE YOU LEAVE: -schedule follow up with Dr. Tawanna Cooler in 1 month  -low back exercises  Heat for 15 minutes twice daily  Do the exercises at least 4 days per week   2 more days of one tablet of prednisone then stop  Can use the flexeril at night and the ibuprofen or tramadol as needed for pain  Seek care immediately if weakness, numbness, fevers, loss of bowel or bladder control or other concerns

## 2014-10-02 NOTE — Progress Notes (Signed)
Pre visit review using our clinic review tool, if applicable. No additional management support is needed unless otherwise documented below in the visit note. 

## 2014-11-04 ENCOUNTER — Ambulatory Visit: Payer: Self-pay | Admitting: Family Medicine

## 2015-06-30 ENCOUNTER — Ambulatory Visit (INDEPENDENT_AMBULATORY_CARE_PROVIDER_SITE_OTHER): Payer: Self-pay | Admitting: Family Medicine

## 2015-06-30 ENCOUNTER — Encounter: Payer: Self-pay | Admitting: Family Medicine

## 2015-06-30 VITALS — BP 118/84 | HR 72 | Temp 98.2°F | Resp 12 | Ht 65.0 in | Wt 178.0 lb

## 2015-06-30 DIAGNOSIS — M5441 Lumbago with sciatica, right side: Secondary | ICD-10-CM

## 2015-06-30 DIAGNOSIS — G894 Chronic pain syndrome: Secondary | ICD-10-CM

## 2015-06-30 MED ORDER — DULOXETINE HCL 30 MG PO CPEP: 30.0000 mg | ORAL_CAPSULE | Freq: Every day | ORAL | Status: DC

## 2015-06-30 MED ORDER — DICLOFENAC SODIUM ER 100 MG PO TB24: 100.0000 mg | ORAL_TABLET | Freq: Every day | ORAL | Status: AC

## 2015-06-30 NOTE — Progress Notes (Signed)
Subjective:    Patient ID: Patricia Ferrell, female    DOB: Mar 01, 1955, 60 y.o.   MRN: [id number]  HPI  [name] is a 60 y.o.female here today c/o 5 days of worsening intermittent lower back pain radiated to RLE.  She states that she has had lower back pain for "many,many years" but for the past year or so getting worse and radiated to RLE.She has been on Tramadol and Flexeril in the past as well as Prednisone.  Pain is worse at night when lying down and first thing in the morning, + stiffness. Relieved by position change and while active during the day.   She denies any recent injury or unusual level of activity.  Pain is radiated to posterior aspect of RLE to calf, dull/sharp like, 6/10 in intensity, with no associated LE numbness, tingling, urinary incontinence (except for urgency/stable for months) or retention, stool incontinence, or saddle anesthesia.  + "Squeezing" like feeling on right LE, no edema or erythema.  No rash or edema on lower back, no fever or chills.   She has tried OTC Ibuprofen as needed.  She has not had recent imaging, reporting X ray done many year ago and reported degenerative changes. + arthralgias of IP hands and right knee "popping" but not knee pain.  She did PT a while ago and felt like it made back pain worse. She has not seen ortho, she has no health insurance.     Review of Systems  Constitutional: Negative for fever, activity change, appetite change, fatigue and unexpected weight change.  HENT: Negative for mouth sores and trouble swallowing.   Respiratory: Negative for cough, shortness of breath and wheezing.   Cardiovascular: Negative for leg swelling.  Gastrointestinal: Negative for nausea, vomiting, abdominal pain and blood in stool.       Negative for changes in bowel habits or fecal incontinence.  Genitourinary: Positive for urgency. Negative for dysuria, hematuria, decreased urine volume, vaginal bleeding and  vaginal discharge.       Occasional urine urgency with incontinence for about 6 months, stable  Musculoskeletal: Positive for back pain and arthralgias. Negative for joint swelling, gait problem and neck pain.  Skin: Negative for color change and rash.  Neurological: Negative for weakness, numbness and headaches.  Hematological: Negative for adenopathy.  Psychiatric/Behavioral: Negative for confusion and sleep disturbance. The patient is nervous/anxious (Hx of anxiety).      Current Outpatient Prescriptions on File Prior to Visit  Medication Sig Dispense Refill  . cyclobenzaprine (FLEXERIL) 5 MG tablet Take 5 mg by mouth as needed for muscle spasms.    . diazepam (VALIUM) 5 MG tablet Take 5 mg by mouth daily as needed for anxiety.     Marland Kitchen. omeprazole (PRILOSEC) 40 MG capsule TAKE 1 CAPSULE BY MOUTH DAILY 90 capsule 0   No current facility-administered medications on file prior to visit.     Past Medical History  Diagnosis Date  . Asthma   . Back pain     lumbar  . Perimenopausal   . Carpal tunnel syndrome   . Hyperlipidemia   . Complication of anesthesia   . PONV (postoperative nausea and vomiting)   . Anxiety     Social History   Social History  . Marital Status: Married    Spouse Name: N/A  . Number of Children: N/A  . Years of Education: N/A   Social History Main Topics  . Smoking status: Former Games developermoker  . Smokeless tobacco: Never  Used  . Alcohol Use: 0.0 oz/week    0 Standard drinks or equivalent per week     Comment: occasional  . Drug Use: No  . Sexual Activity: No   Other Topics Concern  . None   Social History Narrative    Filed Vitals:   06/30/15 1452  BP: 118/84  Pulse: 72  Temp: 98.2 F (36.8 C)  Resp: 12   Body mass index is 29.62 kg/(m^2).      Objective:   Physical Exam  Constitutional: She is oriented to person, place, and time. She appears well-developed and well-nourished. She does not appear ill. No distress.  HENT:  Head:  Atraumatic.  Mouth/Throat: Oropharynx is clear and moist and mucous membranes are normal. No oral lesions.  Eyes: Conjunctivae are normal.  Cardiovascular:  Pulses:      Dorsalis pedis pulses are 2+ on the right side, and 2+ on the left side.  Pulmonary/Chest: Effort normal and breath sounds normal. She has no wheezes. She has no rhonchi. She has no rales.  Abdominal: Soft. Normal appearance. She exhibits no mass. There is no hepatosplenomegaly. There is no tenderness.  Musculoskeletal: She exhibits no edema or tenderness.       Lumbar back: She exhibits no tenderness, no edema, no pain and no spasm.  Lymphadenopathy:    She has no cervical adenopathy.       Right: No supraclavicular adenopathy present.       Left: No supraclavicular adenopathy present.  Neurological: She is alert and oriented to person, place, and time. She has normal strength. No sensory deficit. Coordination and gait normal.  Reflex Scores:      Bicep reflexes are 2+ on the right side and 2+ on the left side.      Patellar reflexes are 2+ on the right side and 2+ on the left side.      Achilles reflexes are 2+ on the right side and 2+ on the left side. SLR negative left, pos right. L4,L5,S1 grossly intact.  Skin: Skin is warm. No lesion and no rash noted.  Psychiatric: She has a normal mood and affect. Her speech is normal.  Nursing note and vitals reviewed.      Assessment & Plan:   Diagnoses and all orders for this visit:  Low back pain with right-sided sciatica, unspecified back pain laterality  Given the chronicity of the problem + worsening + radicular pain I think imaging is warrant, so lumbar MRI recommended, she does not have insurance but would like to proceed with study. I do not think Prednisone is appropriate at this time, chronic. Cymbalta may help with chronic back pain and radicular pain, some side effects discussed. Clearly instructed about warning signs. F/U in 4 weeks.  -     DULoxetine  (CYMBALTA) 30 MG capsule; Take 1 capsule (30 mg total) by mouth daily. -     Diclofenac Sodium CR 100 MG 24 hr tablet; Take 1 tablet (100 mg total) by mouth daily with breakfast. -     MR Lumbar Spine Wo Contrast; Future  Chronic pain disorder  Cymbalta might help. Regular,low impact exercise. Diclofenac recommended for short time, 5-7 days, side effects of NSAID's discussed. Denies Hx of HTN or renal disease. 05/2014 Cr 0.7.   -     DULoxetine (CYMBALTA) 30 MG capsule; Take 1 capsule (30 mg total) by mouth daily. -     Diclofenac Sodium CR 100 MG 24 hr tablet; Take 1 tablet (100 mg  total) by mouth daily with breakfast.       -Patient advised to return or notify a doctor immediately if symptoms worsen or persist or new concerns arise.      Betty G. Swaziland, MD  [facility]. [place] office.

## 2015-06-30 NOTE — Progress Notes (Signed)
Pre visit review using our clinic review tool, if applicable. No additional management support is needed unless otherwise documented below in the visit note. 

## 2015-06-30 NOTE — Patient Instructions (Signed)
A few things to remember from today's visit:   1. Low back pain with right-sided sciatica, unspecified back pain laterality  MRI to be arranged.  - DULoxetine (CYMBALTA) 30 MG capsule; Take 1 capsule (30 mg total) by mouth daily.  Dispense: 30 capsule; Refill: 1  2. Chronic pain disorder  - DULoxetine (CYMBALTA) 30 MG capsule; Take 1 capsule (30 mg total) by mouth daily.  Dispense: 30 capsule; Refill: 1     HOME CARE INSTRUCTIONS Watch your back pain for any changes. The following actions may help to lessen any discomfort you are feeling: 1. Remain active. It is stressful on your back to sit or stand in one place for long periods of time. Do not sit, drive, or stand in one place for more than 30 minutes at a time. Take short walks on even surfaces as soon as you are able.Try to increase the length of time you walk each day. 2. Exercise regularly as directed by your health care provider. Exercise helps your back heal faster. It also helps avoid future injury by keeping your muscles strong and flexible.  3. Do not stay in bed.Resting more than 1-2 days can delay your recovery. 4. Pay attention to your body when you bend and lift. The most comfortable positions are those that put less stress on your recovering back. Always use proper lifting techniques, including: 1. Bending your knees. 2. Keeping the load close to your body. 3. Avoiding twisting.  5. Find a comfortable position to sleep. Use a firm mattress and lie on your side with your knees slightly bent. If you lie on your back, put a pillow under your knees.  6. Over the counter rubbing medications like Icy Hot or local heat might help.  Acetaminophen and/or Aleve/Ibuprofen can be taken if needed and if not contraindications.   7. Apply ice to the injured area: 1. Put ice in a plastic bag. 2. Place a towel between your skin and the bag. 3. Leave the ice on for 20 minutes, 2-3 times a day for the first 2-3 days. After that, ice  and heat may be alternated to reduce pain and spasms. 8. Maintain a healthy weight. Excess weight puts extra stress on your back and makes it difficult to maintain good posture.   SEEK MEDICAL CARE IF: worsening pain, associated fever, rash/edema on area, pain going to legs or buttocks, numbness/tingling, night pain, or abnormal weight loss.    SEEK IMMEDIATE MEDICAL CARE IF:  1. You develop new bowel or bladder control problems. 2. You have unusual weakness or numbness in your arms or legs. 3. You develop nausea or vomiting. 4. You develop abdominal pain. 5. You feel faint.     Back Exercises The following exercises strengthen the muscles that help to support the back. They also help to keep the lower back flexible. Doing these exercises can help to prevent back pain or lessen existing pain. If you have back pain or discomfort, try doing these exercises 2-3 times each day or as told by your health care provider. When the pain goes away, do them once each day, but increase the number of times that you repeat the steps for each exercise (do more repetitions). If you do not have back pain or discomfort, do these exercises once each day or as told by your health care provider.   EXERCISES Single Knee to Chest Repeat these steps 3-5 times for each leg: 1. Lie on your back on a firm bed or  the floor with your legs extended. 2. Bring one knee to your chest. Your other leg should stay extended and in contact with the floor. 3. Hold your knee in place by grabbing your knee or thigh. 4. Pull on your knee until you feel a gentle stretch in your lower back. 5. Hold the stretch for 10-30 seconds. 6. Slowly release and straighten your leg.  Pelvic Tilt Repeat these steps 5-10 times: 9. Lie on your back on a firm bed or the floor with your legs extended. 10. Bend your knees so they are pointing toward the ceiling and your feet are flat on the floor. 11. Tighten your lower abdominal muscles to  press your lower back against the floor. This motion will tilt your pelvis so your tailbone points up toward the ceiling instead of pointing to your feet or the floor. 12. With gentle tension and even breathing, hold this position for 5-10 seconds.  Cat-Cow Repeat these steps until your lower back becomes more flexible: 1. Get into a hands-and-knees position on a firm surface. Keep your hands under your shoulders, and keep your knees under your hips. You may place padding under your knees for comfort. 2. Let your head hang down, and point your tailbone toward the floor so your lower back becomes rounded like the back of a cat. 3. Hold this position for 5 seconds. 4. Slowly lift your head and point your tailbone up toward the ceiling so your back forms a sagging arch like the back of a cow. 5. Hold this position for 5 seconds.   Press-Ups Repeat these steps 5-10 times: 6. Lie on your abdomen (face-down) on the floor. 7. Place your palms near your head, about shoulder-width apart. 8. While you keep your back as relaxed as possible and keep your hips on the floor, slowly straighten your arms to raise the top half of your body and lift your shoulders. Do not use your back muscles to raise your upper torso. You may adjust the placement of your hands to make yourself more comfortable. 9. Hold this position for 5 seconds while you keep your back relaxed. 10. Slowly return to lying flat on the floor.   Bridges Repeat these steps 10 times: 1. Lie on your back on a firm surface. 2. Bend your knees so they are pointing toward the ceiling and your feet are flat on the floor. 3. Tighten your buttocks muscles and lift your buttocks off of the floor until your waist is at almost the same height as your knees. You should feel the muscles working in your buttocks and the back of your thighs. If you do not feel these muscles, slide your feet 1-2 inches farther away from your buttocks. 4. Hold this position  for 3-5 seconds. 5. Slowly lower your hips to the starting position, and allow your buttocks muscles to relax completely. If this exercise is too easy, try doing it with your arms crossed over your chest.

## 2015-07-08 ENCOUNTER — Ambulatory Visit
Admission: RE | Admit: 2015-07-08 | Discharge: 2015-07-08 | Disposition: A | Discharge status: Home / Self Care | Payer: No Typology Code available for payment source | Source: Ambulatory Visit | Attending: Family Medicine | Admitting: Family Medicine

## 2015-07-08 DIAGNOSIS — G894 Chronic pain syndrome: Secondary | ICD-10-CM

## 2015-07-08 DIAGNOSIS — M5441 Lumbago with sciatica, right side: Secondary | ICD-10-CM

## 2015-07-09 ENCOUNTER — Encounter: Payer: Self-pay | Admitting: *Deleted

## 2015-07-13 ENCOUNTER — Telehealth: Payer: Self-pay | Admitting: Family Medicine

## 2015-07-13 NOTE — Telephone Encounter (Signed)
Pt would like to know the results from her MRI from last week.

## 2015-07-17 ENCOUNTER — Telehealth: Payer: Self-pay | Admitting: *Deleted

## 2015-07-17 NOTE — Telephone Encounter (Signed)
Please see my note attached to MRI, she was supposed to be contacted already but seems like result was sent through My chart. Can you please call her with results. She is supposed to follow with me in the next couple weeks. Thanks.

## 2015-07-17 NOTE — Telephone Encounter (Signed)
FYI, patient came in today and requested to have all result communication be called over the phone.

## 2015-07-17 NOTE — Telephone Encounter (Signed)
Left message to return call 

## 2015-07-17 NOTE — Telephone Encounter (Signed)
Pt is still waiting on mri result

## 2015-07-23 ENCOUNTER — Telehealth: Payer: Self-pay | Admitting: Family Medicine

## 2015-07-23 NOTE — Telephone Encounter (Signed)
Pt would like a referral to a Retail buyerneuro surgeon.  Pt states she is not any better. Pt would like to see   [facility] Hewitt ShortsNudelman Robert W MD Address: [address], [place], [place] [address]  Phone: [phone]

## 2015-07-24 ENCOUNTER — Other Ambulatory Visit: Payer: Self-pay | Admitting: Family Medicine

## 2015-07-24 DIAGNOSIS — M5410 Radiculopathy, site unspecified: Secondary | ICD-10-CM

## 2015-07-29 ENCOUNTER — Other Ambulatory Visit: Payer: Self-pay | Admitting: Neurosurgery

## 2015-07-29 DIAGNOSIS — M5136 Other intervertebral disc degeneration, lumbar region: Secondary | ICD-10-CM

## 2015-07-31 ENCOUNTER — Ambulatory Visit
Admission: RE | Admit: 2015-07-31 | Discharge: 2015-07-31 | Disposition: A | Discharge status: Home / Self Care | Payer: No Typology Code available for payment source | Source: Ambulatory Visit | Attending: Neurosurgery | Admitting: Neurosurgery

## 2015-07-31 DIAGNOSIS — M5136 Other intervertebral disc degeneration, lumbar region: Secondary | ICD-10-CM

## 2015-07-31 MED ORDER — METHYLPREDNISOLONE ACETATE 40 MG/ML INJ SUSP (RADIOLOG
120.0000 mg | Freq: Once | INTRAMUSCULAR | Status: AC
  Administered 2015-07-31: 120 mg via EPIDURAL

## 2015-07-31 MED ORDER — IOPAMIDOL (ISOVUE-M 200) INJECTION 41%
1.0000 mL | Freq: Once | INTRAMUSCULAR | Status: AC
  Administered 2015-07-31: 1 mL via EPIDURAL

## 2015-07-31 NOTE — Discharge Instructions (Signed)

## 2015-08-14 ENCOUNTER — Other Ambulatory Visit: Payer: Self-pay | Admitting: Neurosurgery

## 2015-08-14 ENCOUNTER — Ambulatory Visit
Admission: RE | Admit: 2015-08-14 | Discharge: 2015-08-14 | Disposition: A | Discharge status: Home / Self Care | Payer: No Typology Code available for payment source | Source: Ambulatory Visit | Attending: Neurosurgery | Admitting: Neurosurgery

## 2015-08-14 DIAGNOSIS — M5440 Lumbago with sciatica, unspecified side: Secondary | ICD-10-CM

## 2015-08-14 MED ORDER — IOPAMIDOL (ISOVUE-M 200) INJECTION 41%
1.0000 mL | Freq: Once | INTRAMUSCULAR | Status: AC
  Administered 2015-08-14: 1 mL via EPIDURAL

## 2015-08-14 MED ORDER — METHYLPREDNISOLONE ACETATE 40 MG/ML INJ SUSP (RADIOLOG
120.0000 mg | Freq: Once | INTRAMUSCULAR | Status: AC
  Administered 2015-08-14: 120 mg via EPIDURAL

## 2015-08-28 ENCOUNTER — Other Ambulatory Visit: Payer: Self-pay | Admitting: Neurosurgery

## 2015-09-03 ENCOUNTER — Other Ambulatory Visit: Payer: Self-pay

## 2015-09-03 DIAGNOSIS — G894 Chronic pain syndrome: Secondary | ICD-10-CM

## 2015-09-03 DIAGNOSIS — M5441 Lumbago with sciatica, right side: Secondary | ICD-10-CM

## 2015-09-03 NOTE — Telephone Encounter (Signed)
Rx declined because Pt needs to come in for a follow up appointment.

## 2015-09-04 ENCOUNTER — Telehealth: Payer: Self-pay | Admitting: Family Medicine

## 2015-09-04 MED ORDER — DULOXETINE HCL 60 MG PO CPEP: 60.0000 mg | ORAL_CAPSULE | Freq: Every day | ORAL | Status: DC

## 2015-09-04 NOTE — Telephone Encounter (Signed)
Looks like Dr. SwazilandJordan saw pt last, can we ask if she can refill this?

## 2015-09-04 NOTE — Telephone Encounter (Signed)
Pt needs a refill on cymbalta [facility]. Pt was prescribe this med by dr Swazilandjordan for back pain

## 2015-09-04 NOTE — Telephone Encounter (Signed)
I called and spoke to patient, she said she is doing well on the Cymbalta. I did tell her the dose would increase to 60mg . Pt verbalized understanding. Pt is scheduled for a follow up on 7/24 at 4pm. Pt will be having surgery on the 26th.

## 2015-09-04 NOTE — Telephone Encounter (Signed)
Refill request for Duloxetine 30 mg and send to [facility].

## 2015-09-04 NOTE — Telephone Encounter (Signed)
If she is tolerating Cymbalta well, she can increase dose from 30 mg to 60 mg. You can send Cymbalta 60 mg daily # 30/0. F/U appt needed.  Thanks, BJ

## 2015-09-07 NOTE — Telephone Encounter (Signed)
She was supposed to follow 4 weeks after medication was started. I see she has an appt 09/11/15, so refill for Cymbalta 60 mg can be sent assuming that she tolerated 30 mg well; # 30/0. She was also referred to ortho because radicular lower back pain.  Thanks, BJ

## 2015-09-08 NOTE — Telephone Encounter (Signed)
Rx was already sent to pharmacy on 7/14.

## 2015-09-11 ENCOUNTER — Other Ambulatory Visit (HOSPITAL_COMMUNITY): Payer: Self-pay | Admitting: *Deleted

## 2015-09-11 ENCOUNTER — Encounter (HOSPITAL_COMMUNITY)
Admission: RE | Admit: 2015-09-11 | Discharge: 2015-09-11 | Disposition: A | Discharge status: Home / Self Care | Payer: Self-pay | Source: Ambulatory Visit | Attending: Neurosurgery | Admitting: Neurosurgery

## 2015-09-11 ENCOUNTER — Encounter (HOSPITAL_COMMUNITY): Payer: Self-pay

## 2015-09-11 DIAGNOSIS — M5117 Intervertebral disc disorders with radiculopathy, lumbosacral region: Secondary | ICD-10-CM | POA: Insufficient documentation

## 2015-09-11 DIAGNOSIS — F419 Anxiety disorder, unspecified: Secondary | ICD-10-CM | POA: Insufficient documentation

## 2015-09-11 DIAGNOSIS — Z8249 Family history of ischemic heart disease and other diseases of the circulatory system: Secondary | ICD-10-CM | POA: Insufficient documentation

## 2015-09-11 DIAGNOSIS — Z79899 Other long term (current) drug therapy: Secondary | ICD-10-CM | POA: Insufficient documentation

## 2015-09-11 DIAGNOSIS — E785 Hyperlipidemia, unspecified: Secondary | ICD-10-CM | POA: Insufficient documentation

## 2015-09-11 DIAGNOSIS — Z01812 Encounter for preprocedural laboratory examination: Secondary | ICD-10-CM | POA: Insufficient documentation

## 2015-09-11 DIAGNOSIS — G629 Polyneuropathy, unspecified: Secondary | ICD-10-CM | POA: Insufficient documentation

## 2015-09-11 DIAGNOSIS — Z87891 Personal history of nicotine dependence: Secondary | ICD-10-CM | POA: Insufficient documentation

## 2015-09-11 HISTORY — DX: Personal history of other diseases of the digestive system: Z87.19

## 2015-09-11 HISTORY — DX: Gastro-esophageal reflux disease without esophagitis: K21.9

## 2015-09-11 HISTORY — DX: Anemia, unspecified: D64.9

## 2015-09-11 HISTORY — DX: Family history of other specified conditions: Z84.89

## 2015-09-11 HISTORY — DX: Unspecified osteoarthritis, unspecified site: M19.90

## 2015-09-11 LAB — BASIC METABOLIC PANEL
Anion gap: 6 (ref 5–15)
BUN: 10 mg/dL (ref 6–20)
CO2: 28 mmol/L (ref 22–32)
Calcium: 9.5 mg/dL (ref 8.9–10.3)
Chloride: 104 mmol/L (ref 101–111)
Creatinine, Ser: 1.02 mg/dL — ABNORMAL HIGH (ref 0.44–1.00)
GFR calc Af Amer: >60 mL/min (ref >60)
GFR calc non Af Amer: 60 mL/min — ABNORMAL LOW (ref >60)
Glucose, Bld: 118 mg/dL — ABNORMAL HIGH (ref 65–99)
Potassium: 4.7 mmol/L (ref 3.5–5.1)
Sodium: 138 mmol/L (ref 135–145)

## 2015-09-11 LAB — CBC
HCT: 39.8 % (ref 36.0–46.0)
Hemoglobin: 12.7 g/dL (ref 12.0–15.0)
MCH: 27.9 pg (ref 26.0–34.0)
MCHC: 31.9 g/dL (ref 30.0–36.0)
MCV: 87.3 fL (ref 78.0–100.0)
Platelets: 262 10*3/uL (ref 150–400)
RBC: 4.56 MIL/uL (ref 3.87–5.11)
RDW: 14.4 % (ref 11.5–15.5)
WBC: 9.4 10*3/uL (ref 4.0–10.5)

## 2015-09-11 LAB — SURGICAL PCR SCREEN
MRSA, PCR: NEGATIVE
Staphylococcus aureus: NEGATIVE

## 2015-09-11 NOTE — Pre-Procedure Instructions (Signed)
Marilynne DriversKathy D Cairrikier  09/11/2015     Your procedure is scheduled on Wednesday, September 16, 2015 at 12:00 Noon.   [facility] "[facility]" Admitting Office at 10:00 AM.   Call this number if you have problems the morning of surgery: [phone]   Any questions prior to day of surgery, please call [phone] between 8 & 4 PM.   Remember:  Do not eat food or drink liquids after midnight Tuesday, 09/15/15.  Take these medicines the morning of surgery with A SIP OF WATER: Duloxetine (Cymbalta), Omeprazole (Prilosec), Tramadol - if needed   Do not wear jewelry, make-up or nail polish.  Do not wear lotions, powders, or perfumes.  You may wear deodorant.  Do not shave 48 hours prior to surgery.    Do not bring valuables to the hospital.  [facility] is not responsible for any belongings or valuables.  Contacts, dentures or bridgework may not be worn into surgery.  Leave your suitcase in the car.  After surgery it may be brought to your room.  For patients admitted to the hospital, discharge time will be determined by your treatment team.  Patients discharged the day of surgery will not be allowed to drive home.   Special instructions:  [facility] - Preparing for Surgery  Before surgery, you can play an important role.  Because skin is not sterile, your skin needs to be as free of germs as possible.  You can reduce the number of germs on you skin by washing with CHG (chlorahexidine gluconate) soap before surgery.  CHG is an antiseptic cleaner which kills germs and bonds with the skin to continue killing germs even after washing.  Please DO NOT use if you have an allergy to CHG or antibacterial soaps.  If your skin becomes reddened/irritated stop using the CHG and inform your nurse when you arrive at Short Stay.  Do not shave (including legs and underarms) for at least 48 hours prior to the first CHG shower.  You may shave your face.  Please follow these instructions  carefully:   1.  Shower with CHG Soap the night before surgery and the                                morning of Surgery.  2.  If you choose to wash your hair, wash your hair first as usual with your       normal shampoo.  3.  After you shampoo, rinse your hair and body thoroughly to remove the                      Shampoo.  4.  Use CHG as you would any other liquid soap.  You can apply chg directly       to the skin and wash gently with scrungie or a clean washcloth.  5.  Apply the CHG Soap to your body ONLY FROM THE NECK DOWN.        Do not use on open wounds or open sores.  Avoid contact with your eyes, ears, mouth and genitals (private parts).  Wash genitals (private parts) with your normal soap.  6.  Wash thoroughly, paying special attention to the area where your surgery        will be performed.  7.  Thoroughly rinse your body with warm water from the neck down.  8.  DO NOT shower/wash with your normal soap after using and rinsing off       the CHG Soap.  9.  Pat yourself dry with a clean towel.            10.  Wear clean pajamas.            11.  Place clean sheets on your bed the night of your first shower and do not        sleep with pets.  Day of Surgery  Do not apply any lotions the morning of surgery.  Please wear clean clothes to the hospital.   Please read over the following fact sheets that you were given. Pain Booklet, Coughing and Deep Breathing, MRSA Information and Surgical Site Infection Prevention

## 2015-09-11 NOTE — Progress Notes (Signed)
Pt denies cardiac history, chest pain or sob. 

## 2015-09-14 ENCOUNTER — Ambulatory Visit: Payer: Self-pay | Admitting: Family Medicine

## 2015-09-15 MED ORDER — CEFAZOLIN SODIUM-DEXTROSE 2-4 GM/100ML-% IV SOLN
2.0000 g | INTRAVENOUS | Status: AC
  Administered 2015-09-16: 2 g via INTRAVENOUS
  Filled 2015-09-15: qty 100

## 2015-09-16 ENCOUNTER — Encounter (HOSPITAL_COMMUNITY): Payer: Self-pay | Admitting: *Deleted

## 2015-09-16 ENCOUNTER — Ambulatory Visit (HOSPITAL_COMMUNITY): Payer: Self-pay

## 2015-09-16 ENCOUNTER — Encounter (HOSPITAL_COMMUNITY)
Admission: RE | Disposition: A | Discharge status: Home / Self Care | Payer: Self-pay | Source: Ambulatory Visit | Attending: Neurosurgery

## 2015-09-16 ENCOUNTER — Observation Stay (HOSPITAL_COMMUNITY)
Admission: RE | Admit: 2015-09-16 | Discharge: 2015-09-17 | Disposition: A | Discharge status: Home / Self Care | Payer: Self-pay | Source: Ambulatory Visit | Attending: Neurosurgery | Admitting: Neurosurgery

## 2015-09-16 ENCOUNTER — Ambulatory Visit (HOSPITAL_COMMUNITY): Payer: Self-pay | Admitting: Certified Registered Nurse Anesthetist

## 2015-09-16 DIAGNOSIS — E785 Hyperlipidemia, unspecified: Secondary | ICD-10-CM | POA: Insufficient documentation

## 2015-09-16 DIAGNOSIS — G629 Polyneuropathy, unspecified: Secondary | ICD-10-CM | POA: Insufficient documentation

## 2015-09-16 DIAGNOSIS — M5126 Other intervertebral disc displacement, lumbar region: Secondary | ICD-10-CM | POA: Diagnosis present

## 2015-09-16 DIAGNOSIS — Z885 Allergy status to narcotic agent status: Secondary | ICD-10-CM | POA: Insufficient documentation

## 2015-09-16 DIAGNOSIS — M5137 Other intervertebral disc degeneration, lumbosacral region: Secondary | ICD-10-CM | POA: Insufficient documentation

## 2015-09-16 DIAGNOSIS — J449 Chronic obstructive pulmonary disease, unspecified: Secondary | ICD-10-CM | POA: Insufficient documentation

## 2015-09-16 DIAGNOSIS — Z87891 Personal history of nicotine dependence: Secondary | ICD-10-CM | POA: Insufficient documentation

## 2015-09-16 DIAGNOSIS — M5116 Intervertebral disc disorders with radiculopathy, lumbar region: Principal | ICD-10-CM | POA: Insufficient documentation

## 2015-09-16 DIAGNOSIS — M1990 Unspecified osteoarthritis, unspecified site: Secondary | ICD-10-CM | POA: Insufficient documentation

## 2015-09-16 DIAGNOSIS — K219 Gastro-esophageal reflux disease without esophagitis: Secondary | ICD-10-CM | POA: Insufficient documentation

## 2015-09-16 DIAGNOSIS — J45990 Exercise induced bronchospasm: Secondary | ICD-10-CM | POA: Insufficient documentation

## 2015-09-16 DIAGNOSIS — Z419 Encounter for procedure for purposes other than remedying health state, unspecified: Secondary | ICD-10-CM

## 2015-09-16 DIAGNOSIS — M4726 Other spondylosis with radiculopathy, lumbar region: Secondary | ICD-10-CM | POA: Insufficient documentation

## 2015-09-16 DIAGNOSIS — F419 Anxiety disorder, unspecified: Secondary | ICD-10-CM | POA: Insufficient documentation

## 2015-09-16 HISTORY — PX: LUMBAR LAMINECTOMY/DECOMPRESSION MICRODISCECTOMY: SHX5026

## 2015-09-16 SURGERY — LUMBAR LAMINECTOMY/DECOMPRESSION MICRODISCECTOMY 1 LEVEL
Anesthesia: General | Laterality: Right

## 2015-09-16 MED ORDER — PHENYLEPHRINE HCL 10 MG/ML IJ SOLN
INTRAMUSCULAR | Status: DC | PRN
  Administered 2015-09-16: 40 ug via INTRAVENOUS
  Administered 2015-09-16: 80 ug via INTRAVENOUS

## 2015-09-16 MED ORDER — MIDAZOLAM HCL 5 MG/5ML IJ SOLN
INTRAMUSCULAR | Status: DC | PRN
  Administered 2015-09-16: 2 mg via INTRAVENOUS

## 2015-09-16 MED ORDER — ONDANSETRON HCL 4 MG/2ML IJ SOLN
INTRAMUSCULAR | Status: DC | PRN
  Administered 2015-09-16: 4 mg via INTRAVENOUS

## 2015-09-16 MED ORDER — MEPERIDINE HCL 25 MG/ML IJ SOLN
6.2500 mg | INTRAMUSCULAR | Status: DC | PRN
Start: 2015-09-16 — End: 2015-09-16

## 2015-09-16 MED ORDER — HYDROXYZINE HCL 50 MG/ML IM SOLN: 50.0000 mg | INTRAMUSCULAR | Status: DC | PRN

## 2015-09-16 MED ORDER — ROCURONIUM BROMIDE 50 MG/5ML IV SOLN
INTRAVENOUS | Status: AC
  Filled 2015-09-16: qty 1

## 2015-09-16 MED ORDER — LIDOCAINE 2% (20 MG/ML) 5 ML SYRINGE
INTRAMUSCULAR | Status: AC
  Filled 2015-09-16: qty 15

## 2015-09-16 MED ORDER — ONDANSETRON HCL 4 MG/2ML IJ SOLN
INTRAMUSCULAR | Status: AC
  Filled 2015-09-16: qty 2

## 2015-09-16 MED ORDER — HYDROCODONE-ACETAMINOPHEN 5-325 MG PO TABS: 1.0000 | ORAL_TABLET | ORAL | Status: DC | PRN

## 2015-09-16 MED ORDER — ACETAMINOPHEN 650 MG RE SUPP: 650.0000 mg | RECTAL | Status: DC | PRN

## 2015-09-16 MED ORDER — KETOROLAC TROMETHAMINE 30 MG/ML IJ SOLN
30.0000 mg | Freq: Four times a day (QID) | INTRAMUSCULAR | Status: DC
  Administered 2015-09-16 – 2015-09-17 (×3): 30 mg via INTRAVENOUS
  Filled 2015-09-16 (×3): qty 1

## 2015-09-16 MED ORDER — PROMETHAZINE HCL 25 MG/ML IJ SOLN
6.2500 mg | INTRAMUSCULAR | Status: DC | PRN
Start: 2015-09-16 — End: 2015-09-16
  Administered 2015-09-16: 6.25 mg via INTRAVENOUS

## 2015-09-16 MED ORDER — PROPOFOL 10 MG/ML IV BOLUS
INTRAVENOUS | Status: DC | PRN
  Administered 2015-09-16: 200 mg via INTRAVENOUS

## 2015-09-16 MED ORDER — DULOXETINE HCL 60 MG PO CPEP
60.0000 mg | ORAL_CAPSULE | Freq: Every day | ORAL | Status: DC
  Filled 2015-09-16: qty 1

## 2015-09-16 MED ORDER — HEMOSTATIC AGENTS (NO CHARGE) OPTIME
TOPICAL | Status: DC | PRN
  Administered 2015-09-16: 1 via TOPICAL

## 2015-09-16 MED ORDER — SODIUM CHLORIDE 0.9 % IR SOLN
Status: DC | PRN
  Administered 2015-09-16: 12:00:00

## 2015-09-16 MED ORDER — ONDANSETRON HCL 4 MG PO TABS: 4.0000 mg | ORAL_TABLET | Freq: Four times a day (QID) | ORAL | Status: DC | PRN

## 2015-09-16 MED ORDER — DEXTROSE-NACL 5-0.45 % IV SOLN: INTRAVENOUS | Status: DC

## 2015-09-16 MED ORDER — CHLORHEXIDINE GLUCONATE CLOTH 2 % EX PADS
6.0000 | MEDICATED_PAD | Freq: Once | CUTANEOUS | Status: DC
Start: 2015-09-16 — End: 2015-09-16

## 2015-09-16 MED ORDER — LACTATED RINGERS IV SOLN
INTRAVENOUS | Status: DC
  Administered 2015-09-16 (×2): via INTRAVENOUS

## 2015-09-16 MED ORDER — MIDAZOLAM HCL 2 MG/2ML IJ SOLN
INTRAMUSCULAR | Status: AC
  Filled 2015-09-16: qty 2

## 2015-09-16 MED ORDER — DEXAMETHASONE SODIUM PHOSPHATE 10 MG/ML IJ SOLN
INTRAMUSCULAR | Status: AC
  Filled 2015-09-16: qty 2

## 2015-09-16 MED ORDER — PROPOFOL 10 MG/ML IV BOLUS
INTRAVENOUS | Status: AC
  Filled 2015-09-16: qty 20

## 2015-09-16 MED ORDER — PHENYLEPHRINE 40 MCG/ML (10ML) SYRINGE FOR IV PUSH (FOR BLOOD PRESSURE SUPPORT)
PREFILLED_SYRINGE | INTRAVENOUS | Status: AC
  Filled 2015-09-16: qty 20

## 2015-09-16 MED ORDER — EPHEDRINE 5 MG/ML INJ
INTRAVENOUS | Status: AC
  Filled 2015-09-16: qty 10

## 2015-09-16 MED ORDER — MAGNESIUM HYDROXIDE 400 MG/5ML PO SUSP: 30.0000 mL | Freq: Every day | ORAL | Status: DC | PRN

## 2015-09-16 MED ORDER — ROCURONIUM BROMIDE 100 MG/10ML IV SOLN
INTRAVENOUS | Status: DC | PRN
  Administered 2015-09-16: 40 mg via INTRAVENOUS

## 2015-09-16 MED ORDER — FENTANYL CITRATE (PF) 250 MCG/5ML IJ SOLN
INTRAMUSCULAR | Status: AC
  Filled 2015-09-16: qty 5

## 2015-09-16 MED ORDER — MORPHINE SULFATE (PF) 4 MG/ML IV SOLN: 4.0000 mg | INTRAVENOUS | Status: DC | PRN

## 2015-09-16 MED ORDER — SUGAMMADEX SODIUM 200 MG/2ML IV SOLN
INTRAVENOUS | Status: AC
  Filled 2015-09-16: qty 2

## 2015-09-16 MED ORDER — KETOROLAC TROMETHAMINE 30 MG/ML IJ SOLN: 30.0000 mg | Freq: Once | INTRAMUSCULAR | Status: DC

## 2015-09-16 MED ORDER — FENTANYL CITRATE (PF) 100 MCG/2ML IJ SOLN
INTRAMUSCULAR | Status: AC
  Filled 2015-09-16: qty 2

## 2015-09-16 MED ORDER — ACETAMINOPHEN 325 MG PO TABS: 650.0000 mg | ORAL_TABLET | ORAL | Status: DC | PRN

## 2015-09-16 MED ORDER — MENTHOL 3 MG MT LOZG: 1.0000 | LOZENGE | OROMUCOSAL | Status: DC | PRN

## 2015-09-16 MED ORDER — ONDANSETRON HCL 4 MG/2ML IJ SOLN: 4.0000 mg | Freq: Four times a day (QID) | INTRAMUSCULAR | Status: DC | PRN

## 2015-09-16 MED ORDER — PHENOL 1.4 % MT LIQD: 1.0000 | OROMUCOSAL | Status: DC | PRN

## 2015-09-16 MED ORDER — LIDOCAINE HCL (CARDIAC) 20 MG/ML IV SOLN
INTRAVENOUS | Status: DC | PRN
  Administered 2015-09-16: 100 mg via INTRAVENOUS
  Administered 2015-09-16: 40 mg via INTRAVENOUS

## 2015-09-16 MED ORDER — LIDOCAINE 2% (20 MG/ML) 5 ML SYRINGE
INTRAMUSCULAR | Status: AC
  Filled 2015-09-16: qty 5

## 2015-09-16 MED ORDER — BUPIVACAINE HCL (PF) 0.5 % IJ SOLN
INTRAMUSCULAR | Status: DC | PRN
  Administered 2015-09-16: 20 mL

## 2015-09-16 MED ORDER — SODIUM CHLORIDE 0.9 % IV SOLN: 250.0000 mL | INTRAVENOUS | Status: DC

## 2015-09-16 MED ORDER — BISACODYL 10 MG RE SUPP: 10.0000 mg | Freq: Every day | RECTAL | Status: DC | PRN

## 2015-09-16 MED ORDER — EPHEDRINE SULFATE 50 MG/ML IJ SOLN
INTRAMUSCULAR | Status: DC | PRN
  Administered 2015-09-16 (×3): 5 mg via INTRAVENOUS

## 2015-09-16 MED ORDER — SODIUM CHLORIDE 0.9% FLUSH
3.0000 mL | Freq: Two times a day (BID) | INTRAVENOUS | Status: DC
  Administered 2015-09-16: 3 mL via INTRAVENOUS

## 2015-09-16 MED ORDER — OXYCODONE-ACETAMINOPHEN 5-325 MG PO TABS: 1.0000 | ORAL_TABLET | ORAL | Status: DC | PRN

## 2015-09-16 MED ORDER — ZOLPIDEM TARTRATE 5 MG PO TABS: 5.0000 mg | ORAL_TABLET | Freq: Every evening | ORAL | Status: DC | PRN

## 2015-09-16 MED ORDER — KETOROLAC TROMETHAMINE 30 MG/ML IJ SOLN
INTRAMUSCULAR | Status: AC
  Filled 2015-09-16: qty 1

## 2015-09-16 MED ORDER — ACETAMINOPHEN 10 MG/ML IV SOLN
INTRAVENOUS | Status: AC
  Filled 2015-09-16: qty 100

## 2015-09-16 MED ORDER — KETOROLAC TROMETHAMINE 30 MG/ML IJ SOLN
30.0000 mg | Freq: Once | INTRAMUSCULAR | Status: AC
  Administered 2015-09-16: 30 mg via INTRAVENOUS

## 2015-09-16 MED ORDER — ALUM & MAG HYDROXIDE-SIMETH 200-200-20 MG/5ML PO SUSP: 30.0000 mL | Freq: Four times a day (QID) | ORAL | Status: DC | PRN

## 2015-09-16 MED ORDER — CYCLOBENZAPRINE HCL 10 MG PO TABS: 10.0000 mg | ORAL_TABLET | Freq: Three times a day (TID) | ORAL | Status: DC | PRN

## 2015-09-16 MED ORDER — PROMETHAZINE HCL 25 MG/ML IJ SOLN
INTRAMUSCULAR | Status: AC
  Filled 2015-09-16: qty 1

## 2015-09-16 MED ORDER — FENTANYL CITRATE (PF) 100 MCG/2ML IJ SOLN
25.0000 ug | INTRAMUSCULAR | Status: DC | PRN
  Administered 2015-09-16: 25 ug via INTRAVENOUS

## 2015-09-16 MED ORDER — ACETAMINOPHEN 10 MG/ML IV SOLN
INTRAVENOUS | Status: DC | PRN
  Administered 2015-09-16: 1000 mg via INTRAVENOUS

## 2015-09-16 MED ORDER — SUGAMMADEX SODIUM 200 MG/2ML IV SOLN
INTRAVENOUS | Status: DC | PRN
  Administered 2015-09-16: 200 mg via INTRAVENOUS

## 2015-09-16 MED ORDER — THROMBIN 5000 UNITS EX SOLR
CUTANEOUS | Status: DC | PRN
  Administered 2015-09-16: 5000 [IU] via TOPICAL

## 2015-09-16 MED ORDER — SCOPOLAMINE 1 MG/3DAYS TD PT72
1.0000 | MEDICATED_PATCH | Freq: Once | TRANSDERMAL | Status: AC
  Administered 2015-09-16: 1 via TRANSDERMAL
  Filled 2015-09-16: qty 1

## 2015-09-16 MED ORDER — FENTANYL CITRATE (PF) 100 MCG/2ML IJ SOLN
INTRAMUSCULAR | Status: DC | PRN
  Administered 2015-09-16: 100 ug via INTRAVENOUS
  Administered 2015-09-16: 50 ug via INTRAVENOUS
  Administered 2015-09-16: 100 ug via INTRAVENOUS

## 2015-09-16 MED ORDER — SODIUM CHLORIDE 0.9% FLUSH: 3.0000 mL | INTRAVENOUS | Status: DC | PRN

## 2015-09-16 MED ORDER — HYDROXYZINE HCL 25 MG PO TABS: 50.0000 mg | ORAL_TABLET | ORAL | Status: DC | PRN

## 2015-09-16 MED ORDER — METHYLPREDNISOLONE ACETATE 80 MG/ML IJ SUSP
INTRAMUSCULAR | Status: DC | PRN
  Administered 2015-09-16: 80 mg

## 2015-09-16 MED ORDER — LIDOCAINE-EPINEPHRINE 1 %-1:100000 IJ SOLN
INTRAMUSCULAR | Status: DC | PRN
  Administered 2015-09-16: 20 mL

## 2015-09-16 MED ORDER — DEXAMETHASONE SODIUM PHOSPHATE 10 MG/ML IJ SOLN
INTRAMUSCULAR | Status: DC | PRN
  Administered 2015-09-16: 10 mg via INTRAVENOUS

## 2015-09-16 SURGICAL SUPPLY — 52 items
BAG DECANTER FOR FLEXI CONT (MISCELLANEOUS) ×2 IMPLANT
BENZOIN TINCTURE PRP APPL 2/3 (GAUZE/BANDAGES/DRESSINGS) IMPLANT
BLADE CLIPPER SURG (BLADE) IMPLANT
BRUSH SCRUB EZ PLAIN DRY (MISCELLANEOUS) ×2 IMPLANT
BUR ACORN 6.0 ACORN (BURR) IMPLANT
BUR ACRON 5.0MM COATED (BURR) IMPLANT
BUR MATCHSTICK NEURO 3.0 LAGG (BURR) ×2 IMPLANT
CANISTER SUCT 3000ML PPV (MISCELLANEOUS) ×2 IMPLANT
DERMABOND ADVANCED (GAUZE/BANDAGES/DRESSINGS) ×1
DERMABOND ADVANCED .7 DNX12 (GAUZE/BANDAGES/DRESSINGS) ×1 IMPLANT
DRAPE LAPAROTOMY 100X72X124 (DRAPES) ×2 IMPLANT
DRAPE MICROSCOPE LEICA (MISCELLANEOUS) ×2 IMPLANT
DRAPE POUCH INSTRU U-SHP 10X18 (DRAPES) ×2 IMPLANT
DRSG EMULSION OIL 3X3 NADH (GAUZE/BANDAGES/DRESSINGS) IMPLANT
ELECT REM PT RETURN 9FT ADLT (ELECTROSURGICAL) ×2
ELECTRODE REM PT RTRN 9FT ADLT (ELECTROSURGICAL) ×1 IMPLANT
GAUZE SPONGE 4X4 12PLY STRL (GAUZE/BANDAGES/DRESSINGS) IMPLANT
GAUZE SPONGE 4X4 16PLY XRAY LF (GAUZE/BANDAGES/DRESSINGS) IMPLANT
GLOVE BIOGEL PI IND STRL 8 (GLOVE) ×1 IMPLANT
GLOVE BIOGEL PI INDICATOR 8 (GLOVE) ×1
GLOVE ECLIPSE 7.5 STRL STRAW (GLOVE) ×2 IMPLANT
GLOVE EXAM NITRILE LRG STRL (GLOVE) IMPLANT
GLOVE EXAM NITRILE MD LF STRL (GLOVE) IMPLANT
GLOVE EXAM NITRILE XL STR (GLOVE) IMPLANT
GLOVE EXAM NITRILE XS STR PU (GLOVE) IMPLANT
GOWN STRL REUS W/ TWL LRG LVL3 (GOWN DISPOSABLE) ×1 IMPLANT
GOWN STRL REUS W/ TWL XL LVL3 (GOWN DISPOSABLE) IMPLANT
GOWN STRL REUS W/TWL 2XL LVL3 (GOWN DISPOSABLE) IMPLANT
GOWN STRL REUS W/TWL LRG LVL3 (GOWN DISPOSABLE) ×1
GOWN STRL REUS W/TWL XL LVL3 (GOWN DISPOSABLE)
KIT BASIN OR (CUSTOM PROCEDURE TRAY) ×2 IMPLANT
KIT ROOM TURNOVER OR (KITS) ×2 IMPLANT
NEEDLE HYPO 18GX1.5 BLUNT FILL (NEEDLE) IMPLANT
NEEDLE SPNL 18GX3.5 QUINCKE PK (NEEDLE) ×2 IMPLANT
NEEDLE SPNL 22GX3.5 QUINCKE BK (NEEDLE) ×2 IMPLANT
NS IRRIG 1000ML POUR BTL (IV SOLUTION) ×2 IMPLANT
PACK LAMINECTOMY NEURO (CUSTOM PROCEDURE TRAY) ×2 IMPLANT
PAD ARMBOARD 7.5X6 YLW CONV (MISCELLANEOUS) ×6 IMPLANT
PATTIES SURGICAL .5 X1 (DISPOSABLE) ×2 IMPLANT
RUBBERBAND STERILE (MISCELLANEOUS) ×4 IMPLANT
SPONGE LAP 4X18 X RAY DECT (DISPOSABLE) IMPLANT
SPONGE SURGIFOAM ABS GEL SZ50 (HEMOSTASIS) ×2 IMPLANT
STRIP CLOSURE SKIN 1/2X4 (GAUZE/BANDAGES/DRESSINGS) IMPLANT
SUT PROLENE 6 0 BV (SUTURE) IMPLANT
SUT VIC AB 1 CT1 18XBRD ANBCTR (SUTURE) ×1 IMPLANT
SUT VIC AB 1 CT1 8-18 (SUTURE) ×1
SUT VIC AB 2-0 CP2 18 (SUTURE) ×2 IMPLANT
SUT VIC AB 3-0 SH 8-18 (SUTURE) IMPLANT
SYR 5ML LL (SYRINGE) IMPLANT
TOWEL OR 17X24 6PK STRL BLUE (TOWEL DISPOSABLE) ×2 IMPLANT
TOWEL OR 17X26 10 PK STRL BLUE (TOWEL DISPOSABLE) ×2 IMPLANT
WATER STERILE IRR 1000ML POUR (IV SOLUTION) ×2 IMPLANT

## 2015-09-16 NOTE — OR Nursing (Signed)
Fentanyl 100 mcg/86ml given to operative site by Dr Newell Coral prior to closing

## 2015-09-16 NOTE — Progress Notes (Signed)
Vitals:   09/16/15 1505 09/16/15 1521 09/16/15 1531 09/16/15 1540  BP: 105/61 104/66  (!) 143/80  Pulse: 80 87 81 66  Resp: 12 13 12 18   Temp:   97 F (36.1 C) 97.8 F (36.6 C)  TempSrc:      SpO2: 98% 93% 93% 94%  Weight:        Patient up into the bathroom. Has ambulated in the halls. Wound clean and dry. Patient has voided. Excellent relief of radicular pain.  Plan: Doing well following surgery. Encouraged to ambulate 12 more times this evening, and again tomorrow. We'll continue to progress to postoperative recovery.  Hewitt Shorts, MD 09/16/2015, 6:37 PM

## 2015-09-16 NOTE — Anesthesia Postprocedure Evaluation (Signed)
Anesthesia Post Note  Patient: Patricia Ferrell  Procedure(s) Performed: Procedure(s) (LRB): Right - Lumbar five-sacral one  lumbar laminotomy and microdiscectomy (Right)  Patient location during evaluation: PACU Anesthesia Type: General Level of consciousness: awake Pain management: pain level controlled Vital Signs Assessment: post-procedure vital signs reviewed and stable Respiratory status: spontaneous breathing Cardiovascular status: stable Postop Assessment: no signs of nausea or vomiting Anesthetic complications: no     Last Vitals:  Vitals:   09/16/15 1400 09/16/15 1406  BP:  120/78  Pulse:  99  Resp: 18 19  Temp:      Last Pain:  Vitals:   09/16/15 1041  TempSrc: Oral  PainSc: 4    Pain Goal: Patients Stated Pain Goal: 3 (09/16/15 1041)               Jovanni Rash JR,JOHN Susann Givens

## 2015-09-16 NOTE — Anesthesia Procedure Notes (Signed)
Procedure Name: Intubation Date/Time: 09/16/2015 12:15 PM Performed by: Virgel Gess LEFFEW Pre-anesthesia Checklist: Patient identified, Emergency Drugs available, Suction available and Patient being monitored Patient Re-evaluated:Patient Re-evaluated prior to inductionOxygen Delivery Method: Circle System Utilized Preoxygenation: Pre-oxygenation with 100% oxygen Intubation Type: IV induction Ventilation: Mask ventilation without difficulty Laryngoscope Size: Mac and 3 Grade View: Grade I Tube type: Oral Tube size: 7.0 mm Number of attempts: 1 Airway Equipment and Method: Stylet Placement Confirmation: ETT inserted through vocal cords under direct vision,  positive ETCO2 and breath sounds checked- equal and bilateral Secured at: 22 cm Tube secured with: Tape Dental Injury: Teeth and Oropharynx as per pre-operative assessment

## 2015-09-16 NOTE — H&P (Signed)
Subjective: Patient is a 60 y.o. right-handed white female who is admitted for treatment of right lumbar radiculopathy secondary to right L5-S1 lumbar disc herniation.  Patient had low back pain for many years, but began to develop numbness and pain and burning to the right lower extremity about 5-6 months ago. MRI revealed a right L5-S1 lumbar disc patient. Patient underwent a series of 2 epidural injections. The first helped some the second aggravated the pain. She is requesting go ahead with definitive surgical intervention, is admitted for right L5-S1 lumbar laminotomy and discectomy.   Patient Active Problem List   Diagnosis Date Noted  . Radicular pain of right lower extremity 07/24/2015  . Calculus of gallbladder with acute cholecystitis without obstruction 06/20/2014  . Nausea and vomiting 06/18/2014  . Abdominal pain 06/17/2014  . Nausea & vomiting 06/17/2014  . Diarrhea 06/17/2014  . ALLERGIC RHINITIS 09/08/2008  . ANXIETY 09/02/2008  . HYPERLIPIDEMIA 02/18/2008  . 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 PERIMENOPAUSAL SYNDROME 02/06/2008  . CARPAL TUNNEL SYNDROME, BILATERAL, HX OF 02/06/2008  . ASTHMA 08/21/2007  . SEBORRHEIC KERATOSIS 08/21/2007  . DISC DISEASE, LUMBAR 07/25/2007  . EXTRINSIC ASTHMA, UNSPECIFIED 05/04/2007   Past Medical History:  Diagnosis Date  . Anemia    years ago  . Anxiety   . Arthritis   . Asthma    bronchitis  . Back pain    lumbar  . Carpal tunnel syndrome   . Complication of anesthesia   . Family history of adverse reaction to anesthesia    mother's blood pressure always drops   . GERD (gastroesophageal reflux disease)   . History of hiatal hernia   . Hyperlipidemia   . Perimenopausal   . PONV (postoperative nausea and vomiting)     Past Surgical History:  Procedure Laterality Date  . ABDOMINAL HYSTERECTOMY     DUB  . CHOLECYSTECTOMY  06/19/2014  . CHOLECYSTECTOMY N/A 06/19/2014   Procedure: LAPAROSCOPIC CHOLECYSTECTOMY WITH  INTRAOPERATIVE CHOLANGIOGRAM;  Surgeon: Manus Rudd, MD;  Location: [facility] OR;  Service: General;  Laterality: N/A;  . COLONOSCOPY    . OOPHORECTOMY  02/21/94   left    Prescriptions Prior to Admission  Medication Sig Dispense Refill Last Dose  . DULoxetine (CYMBALTA) 60 MG capsule Take 1 capsule (60 mg total) by mouth daily. 30 capsule 0 09/16/2015 at 0730  . traMADol (ULTRAM) 50 MG tablet Take 50 mg by mouth every 6 (six) hours as needed for moderate pain.   09/15/2015 at 2200  . omeprazole (PRILOSEC) 40 MG capsule TAKE 1 CAPSULE BY MOUTH DAILY 90 capsule 0 Taking   Allergies  Allergen Reactions  . Codeine Nausea And Vomiting    Social History  Substance Use Topics  . Smoking status: Former Smoker    Quit date: 02/21/1988  . Smokeless tobacco: Never Used  . Alcohol use 0.0 oz/week     Comment: occasional    Family History  Problem Relation Age of Onset  . Coronary artery disease Mother   . Heart attack Mother   . Hypertension Mother   . Colon polyps Father      Review of Systems A comprehensive review of systems was negative.  Objective: Vital signs in last 24 hours: Temp:  [98.1 F (36.7 C)] 98.1 F (36.7 C) (07/26 1041) Pulse Rate:  [70] 70 (07/26 1041) Resp:  [18] 18 (07/26 1041) BP: (133)/(87) 133/87 (07/26 1041) SpO2:  [99 %] 99 % (07/26 1041) Weight:  [83.4 kg (183 lb 14.4 oz)] 83.4  kg (183 lb 14.4 oz) (07/26 1016)  EXAM: Patient well-developed well-nourished white female in no acute distress.  Lungs are clear to auscultation , the patient has symmetrical respiratory excursion. Heart has a regular rate and rhythm normal S1 and S2 no murmur.   Abdomen is soft nontender nondistended bowel sounds are present. Extremity examination shows no clubbing cyanosis or edema. Motor examination shows 5 over 5 strength in the lower extremities including the iliopsoas quadriceps dorsiflexor extensor hallicus  longus and plantar flexor bilaterally. Sensation is intact to pinprick in  the distal lower extremities. Reflexes are symmetrical bilaterally. No pathologic reflexes are present. Gait and stance favor the right lower extremity.  Data Review:CBC    Component Value Date/Time   WBC 9.4 09/11/2015 1416   RBC 4.56 09/11/2015 1416   HGB 12.7 09/11/2015 1416   HCT 39.8 09/11/2015 1416   PLT 262 09/11/2015 1416   MCV 87.3 09/11/2015 1416   MCH 27.9 09/11/2015 1416   MCHC 31.9 09/11/2015 1416   RDW 14.4 09/11/2015 1416   LYMPHSABS 2.2 06/17/2014 0750   MONOABS 0.6 06/17/2014 0750   EOSABS 0.1 06/17/2014 0750   BASOSABS 0.1 06/17/2014 0750                          BMET    Component Value Date/Time   NA 138 09/11/2015 1416   K 4.7 09/11/2015 1416   CL 104 09/11/2015 1416   CO2 28 09/11/2015 1416   GLUCOSE 118 (H) 09/11/2015 1416   BUN 10 09/11/2015 1416   CREATININE 1.02 (H) 09/11/2015 1416   CALCIUM 9.5 09/11/2015 1416   GFRNONAA 60 (L) 09/11/2015 1416   GFRAA >60 09/11/2015 1416     Assessment/Plan: Patient with right lumbar radiculopathy secondary to right L5-S1 lumbar disc herniation, who is admitted for right L5-S1 lumbar laminotomy and microdiscectomy.  I've discussed with the patient the nature of his condition, the nature the surgical procedure, the typical length of surgery, hospital stay, and overall recuperation. We discussed limitations postoperatively. I discussed risks of surgery including risks of infection, bleeding, possibly need for transfusion, the risk of nerve root dysfunction with pain, weakness, numbness, or paresthesias, or risk of dural tear and CSF leakage and possible need for further surgery, the risk of recurrent disc herniation and the possible need for further surgery, and the risk of anesthetic complications including myocardial infarction, stroke, pneumonia, and death. Understanding all this the patient does wish to proceed with surgery and is admitted for such.    Hewitt Shorts, MD 09/16/2015 11:34 AM

## 2015-09-16 NOTE — Anesthesia Preprocedure Evaluation (Signed)
Anesthesia Evaluation  Patient identified by MRN, date of birth, ID band Patient awake    Reviewed: Allergy & Precautions, H&P , NPO status , Patient's Chart, lab work & pertinent test results  Airway Mallampati: I  TM Distance: >3 FB Neck ROM: full    Dental no notable dental hx. (+) Teeth Intact   Pulmonary COPD, former smoker,    Pulmonary exam normal        Cardiovascular negative cardio ROS Normal cardiovascular exam     Neuro/Psych negative psych ROS   GI/Hepatic Neg liver ROS, GERD  ,  Endo/Other  negative endocrine ROS  Renal/GU negative Renal ROS     Musculoskeletal   Abdominal Normal abdominal exam  (+)   Peds  Hematology   Anesthesia Other Findings   Reproductive/Obstetrics negative OB ROS                             Anesthesia Physical  Anesthesia Plan  ASA: II  Anesthesia Plan: General   Post-op Pain Management:    Induction: Intravenous  Airway Management Planned: Oral ETT  Additional Equipment:   Intra-op Plan:   Post-operative Plan: Extubation in OR  Informed Consent: I have reviewed the patients History and Physical, chart, labs and discussed the procedure including the risks, benefits and alternatives for the proposed anesthesia with the patient or authorized representative who has indicated his/her understanding and acceptance.   Dental Advisory Given  Plan Discussed with: CRNA and Surgeon  Anesthesia Plan Comments:         Anesthesia Quick Evaluation

## 2015-09-16 NOTE — Transfer of Care (Signed)
Immediate Anesthesia Transfer of Care Note  Patient: Patricia Ferrell  Procedure(s) Performed: Procedure(s): Right - L5-S1 lumbar laminotomy and microdiscectomy (Right)  Patient Location: PACU  Anesthesia Type:General  Level of Consciousness: awake, alert , oriented and patient cooperative  Airway & Oxygen Therapy: Patient Spontanous Breathing and Patient connected to nasal cannula oxygen  Post-op Assessment: Report given to RN, Post -op Vital signs reviewed and stable and Patient moving all extremities X 4  Post vital signs: Reviewed and stable  Last Vitals:  Vitals:   09/16/15 1041 09/16/15 1349  BP: 133/87   Pulse: 70   Resp: 18   Temp: 36.7 C (P) 36.4 C    Last Pain:  Vitals:   09/16/15 1041  TempSrc: Oral  PainSc: 4       Patients Stated Pain Goal: 3 (09/16/15 1041)  Complications: No apparent anesthesia complications

## 2015-09-16 NOTE — Op Note (Signed)
09/16/2015  1:34 PM  PATIENT:  Patricia Ferrell  60 y.o. female  PRE-OPERATIVE DIAGNOSIS:  Right L5-S1 lumbar disc condition, lumbar degenerative disease, lumbar spondylosis, right lumbar radiculopathy  POST-OPERATIVE DIAGNOSIS:  Right L5-S1 lumbar disc condition, lumbar degenerative disease, lumbar spondylosis, right lumbar radiculopathy  PROCEDURE:  Procedure(s):  Right - L5-S1 lumbar laminotomy and microdiscectomy, with microdissection, microsurgical technique, and the operating microscope  SURGEON:  Surgeon(s): Shirlean Kelly, MD  ANESTHESIA:   general  EBL:  Total I/O In: 1000 [I.V.:1000] Out: 25 [Blood:25]  BLOOD ADMINISTERED:none  COUNT: Correct per nursing staff  DICTATION: Patient was brought to the operating room and placed under general endotracheal anesthesia. Patient was turned to prone position the lumbar region was prepped with Betadine soap and solution and draped in a sterile fashion. The midline was infiltrated with local anesthetic with epinephrine. A localizing x-ray was taken and the L5-S1 level was identified. Midline incision was made over the L5-S1 level and was carried down through the subcutaneous tissue to the lumbar fascia. The lumbar fascia was incised on the right side and the paraspinal muscles were dissected from the spinous processes and lamina in a subperiosteal fashion. Another x-ray was taken and the L5-S1 intralaminar space was identified. The operating microscope was draped and brought into the field provided additional magnification, illumination, and visualization. Laminotomy was performed using the high-speed drill and Kerrison punches. The ligamentum flavum was carefully resected. The underlying thecal sac and nerve root were identified. The disc herniation was identified and the thecal sac and nerve root gently retracted medially. The discretion was found to be directly underlying the exiting right S1 nerve root. The remaining aorta fibers were  incised and a subligamentous disc fragment herniation was removed. Several fragments were removed, and good decompression of the thecal sac and nerve was achieved. It was felt that there was no advantage to entering the disc space and proceeding with an intradiscal discectomy. Once the discectomy was completed and good decompression of the thecal sac and nerve had been achieved hemostasis was established with the use of bipolar cautery and Gelfoam with thrombin. The Gelfoam was removed and hemostasis confirmed. We then instilled 2 cc of fentanyl and 80 mg of Depo-Medrol into the epidural space. Deep fascia was closed with interrupted undyed 1 Vicryl sutures. Scarpa's fascia was closed with interrupted undyed 1 Vicryl sutures in the subcutaneous and subcuticular layer were closed with interrupted inverted 2-0 undyed Vicryl sutures. The skin edges were approximated with Dermabond. Following surgery the patient was turned back to a supine position to be reversed from the anesthetic extubated and transferred to the recovery room for further care.  PLAN OF CARE: Admit for overnight observation  PATIENT DISPOSITION:  PACU - hemodynamically stable.   Delay start of Pharmacological VTE agent (>24hrs) due to surgical blood loss or risk of bleeding:  yes

## 2015-09-17 ENCOUNTER — Encounter (HOSPITAL_COMMUNITY): Payer: Self-pay | Admitting: Neurosurgery

## 2015-09-17 MED ORDER — TRAMADOL HCL 50 MG PO TABS: 50.0000 mg | ORAL_TABLET | Freq: Four times a day (QID) | ORAL | 0 refills | Status: DC | PRN

## 2015-09-17 NOTE — Discharge Summary (Signed)
Physician Discharge Summary  Patient ID: MYRTIS ARELLANO MRN: [id number] DOB/AGE: 1955-09-18 60 y.o.  Admit date: 09/16/2015 Discharge date: 09/17/2015  Admission Diagnoses:  Right L5-S1 lumbar disc condition, lumbar degenerative disease, lumbar spondylosis, right lumbar radiculopathy  Discharge Diagnoses:  Right L5-S1 lumbar disc condition, lumbar degenerative disease, lumbar spondylosis, right lumbar radiculopathy Active Problems:   HNP (herniated nucleus pulposus), lumbar   Discharged Condition: good  Hospital Course: Patient was admitted, underwent a right L5-S1 lumbar laminotomy and microdiscectomy. Postoperatively she has had excellent relief of her right lumbar radicular pain. She is up and ambulating actively. Her incision is healing nicely. She is being discharged home with instructions regarding wound care and activities. She is scheduled to follow-up with me in 3 weeks.  Discharge Exam: Blood pressure (!) 96/59, pulse 64, temperature 98.1 F (36.7 C), temperature source Oral, resp. rate 16, weight 83.4 kg (183 lb 14.4 oz), SpO2 95 %.  Disposition: 01-Home or Self Care     Medication List    STOP taking these medications   omeprazole 40 MG capsule Commonly known as:  PRILOSEC     TAKE these medications   DULoxetine 60 MG capsule Commonly known as:  CYMBALTA Take 1 capsule (60 mg total) by mouth daily.   traMADol 50 MG tablet Commonly known as:  ULTRAM Take 50 mg by mouth every 6 (six) hours as needed for moderate pain. What changed:  Another medication with the same name was added. Make sure you understand how and when to take each.   traMADol 50 MG tablet Commonly known as:  ULTRAM Take 1 tablet (50 mg total) by mouth every 6 (six) hours as needed (pain). What changed:  You were already taking a medication with the same name, and this prescription was added. Make sure you understand how and when to take each.        SignedHewitt Shorts 09/17/2015, 6:48 AM

## 2015-09-17 NOTE — Progress Notes (Signed)
Pt doing well. Pt given D/C instructions with Rx, verbal understanding was provided. Pt's incision is clean and dry with no sign of infection. Pt's IV was removed prior to D/C. Pt D/C'd home via wheelchair @ 0745 per MD order. Pt is stable @ D/C and has no other needs at this time. Rema Fendt, RN

## 2015-09-17 NOTE — Discharge Instructions (Signed)
Wound Care °Leave incision open to air. °You may shower. °Do not scrub directly on incision.  °Do not put any creams, lotions, or ointments on incision. °Activity °Walk each and every day, increasing distance each day. °No lifting greater than 5 lbs.  Avoid bending, arching, and twisting. °No driving for 2 weeks; may ride as a passenger locally. °If provided with back brace, wear when out of bed.  It is not necessary to wear in bed. °Diet °Resume your normal diet.  °Return to Work °Will be discussed at you follow up appointment. °Call Your Doctor If Any of These Occur °Redness, drainage, or swelling at the wound.  °Temperature greater than 101 degrees. °Severe pain not relieved by pain medication. °Incision starts to come apart. °Follow Up Appt °Call today for appointment in 3 weeks ([phone]) or for problems.  If you have any hardware placed in your spine, you will need an x-ray before your appointment. ° °Laminectomy °During a laminectomy, small pieces of bone in the spine called lamina are removed. The ligaments underneath the lamina and parts of the joints that have grown too large are also removed. This takes pressure off the nerves.  °LET YOUR HEALTH CARE PROVIDER KNOW ABOUT: °· Any allergies you have. °· All medicines you are taking, including vitamins, herbs, eye drops, creams, and over-the-counter medicines. °· Previous problems you or members of your family have had with the use of anesthetics. °· Any blood disorders you have. °· Previous surgeries you have had. °· Medical conditions you have. °RISKS AND COMPLICATIONS  °Generally, laminectomy is a safe procedure. However, as with any procedure, complications can occur. Possible complications include: °· Infection near the incision. °· Nerve damage. Signs of this can be pain, weakness, or numbness. °· Leaking of spinal fluid. °· Blood clot in a leg. The clot can move to the lungs. This can be very serious. °· Bowel or bladder incontinence (rare). °BEFORE  THE PROCEDURE  °· You will need to stop taking certain medicines as directed by your health care provider. °· If you smoke, stop at least 2 weeks before the procedure. Smoking can slow down the healing process and increase the risk of complications. °· Do not eat or drink anything for at least 8 hours before the procedure. Take any medicines that your health care provider tells you to keep taking with a sip of water. °· Do not drink alcohol the day before your surgery. °· Tell your health care provider if you develop a cold or any infection before your surgery. °· Arrange for someone to drive you home after the procedure or after your hospital stay. Also arrange for someone to help you with activities during recovery. °PROCEDURE °· Small monitors will be placed on your body. They are used to check your heart, blood pressure, and oxygen level. °· An IV tube will be inserted into one of your veins. Medicine will flow directly into your body through the IV tube. °· You might be given a sedative. This will help you relax. °· You will be given a medicine to make you sleep (general anesthetic), and a breathing tube will be placed into your lungs. During general anesthesia, you are unaware of the procedure and do not feel any pain. °· Your back will be cleaned with a special solution to kill germs on your skin. °· Once you are asleep, the surgeon will make a 2-inch to 5-inch cut (incision) in your back. The length of the incision will depend on how   many spinal bones (vertebrae) are being operated on. °· Muscles in the back will be moved away from the vertebrae and pulled to the side. °· Pieces of lamina will be removed. °· The ligament that lies under the lamina and connects your vertebrae will be removed. °· Enough ligaments and thickened joints will be removed to take pressure off your nerves. °· Your nerves will be identified, and their passage will be tracked and assessed for excessive tightness. °· Your back muscles  will be moved back into their normal position. °· The area under your skin will be closed with small, absorbable stitches. These stitches do not need to be removed. °· Your skin will be closed with small absorbable stitches or staples. °· A dressing will be put over your incision. °· The procedure may take 1-3 hours. °AFTER THE PROCEDURE  °· You will stay in a recovery area until the anesthesia has worn off. Your blood pressure and pulse will be checked every so often. Then you will be taken to a hospital room. °· You may continue to get fluids through the IV tube for a while. °· Some pain is normal. You may be given pain medicine while still in the recovery area. °· It is important to be up and moving as soon as possible after a surgery. Physical therapists will help you start walking. °· To prevent blood clots in your legs: °¨ You may be given special stockings to wear. °¨ You may need to take medicine to prevent clots. °· You may be asked to do special breathing exercises to re-expand your lungs. This is to prevent a lung infection. °· Most people stay in the hospital for 1-3 days after a laminectomy. °  °This information is not intended to replace advice given to you by your health care provider. Make sure you discuss any questions you have with your health care provider. °  °Document Released: 01/26/2009 Document Revised: 11/28/2012 Document Reviewed: 09/19/2012 °Elsevier Interactive Patient Education ©2016 [employer] ° °

## 2015-10-06 ENCOUNTER — Encounter: Payer: Self-pay | Admitting: Family Medicine

## 2015-10-06 ENCOUNTER — Ambulatory Visit (INDEPENDENT_AMBULATORY_CARE_PROVIDER_SITE_OTHER): Payer: Self-pay | Admitting: Family Medicine

## 2015-10-06 VITALS — BP 110/80 | HR 108 | Resp 12 | Ht 65.5 in | Wt 181.4 lb

## 2015-10-06 DIAGNOSIS — F419 Anxiety disorder, unspecified: Secondary | ICD-10-CM

## 2015-10-06 DIAGNOSIS — R519 Headache, unspecified: Secondary | ICD-10-CM

## 2015-10-06 DIAGNOSIS — R51 Headache: Secondary | ICD-10-CM

## 2015-10-06 DIAGNOSIS — R251 Tremor, unspecified: Secondary | ICD-10-CM

## 2015-10-06 DIAGNOSIS — R740 Nonspecific elevation of levels of transaminase and lactic acid dehydrogenase [LDH]: Secondary | ICD-10-CM

## 2015-10-06 DIAGNOSIS — M5410 Radiculopathy, site unspecified: Secondary | ICD-10-CM

## 2015-10-06 DIAGNOSIS — R7401 Elevation of levels of liver transaminase levels: Secondary | ICD-10-CM

## 2015-10-06 MED ORDER — DULOXETINE HCL 60 MG PO CPEP: 60.0000 mg | ORAL_CAPSULE | Freq: Every day | ORAL | 1 refills | Status: DC

## 2015-10-06 NOTE — Patient Instructions (Addendum)
A few things to remember from today's visit:   Elevated transaminase level - Plan: Hepatic Function Panel  Nonintractable episodic headache, unspecified headache type  Anxiety disorder, unspecified  Radicular pain of right lower extremity  Tremor of both hands - Plan: Basic metabolic panel, TSH  I recommended stopping tramadol, because the risk of interaction with Cymbalta. Keep appointment with neurosurgeon. Check a pulse of home. Tremor more could be transient but could also be the beginning of essential tremor. Monitor headache, if it becomes severe we may need brain imaging.    We have ordered labs or studies at this visit.  It can take up to 1-2 weeks for results and processing. IF results require follow up or explanation, we will call you with instructions. Clinically stable results will be released to your [facility]. If you have not heard from us or cannot find your results in [facility] in 2 weeks please contact our office at [phone].  If you are not yet signed up for [facility], please consider signing up  Please be sure medication list is accurate. If a new problem present, please set up appointment sooner than planned today.

## 2015-10-06 NOTE — Progress Notes (Signed)
Pre visit review using our clinic review tool, if applicable. No additional management support is needed unless otherwise documented below in the visit note. 

## 2015-10-06 NOTE — Progress Notes (Signed)
HPI:   [name] Ferrell is a 60 y.o. female, who is here today to follow on back pain.  I saw [name] Ferrell on 06/30/2015 for back pain, at that time Cymbalta 30 mg was started, she didn't follow as instructed. Cymbalta was increased to 60 mg by DR Erick ColaceNudelman,neurosurgeon, has not helped with back pain but has helped with mood and anxiety "tremendously" .   She underwent lumbar surgery 09/16/2015 according to patient right lower extremity pain resolved but she still has a "funny" sensation on extremity, no edema or erythema. Still having lower back pain, no radiated, mild to moderate, improved after surgery.  -She is also reporting tremor, "jerking inside", which started right after surgery and according to patient it was attributed to anesthetic, but not better, stable overall. He seems to be exacerbated by manual activities and alleviated with rest. Mother with history of tremor.  Denies alcohol abuse. Reviewing labs, transaminases elevated in 05/2014 (ALT 46, AST 49).  + Bitemporal headache for the past few days, mild to moderate, no associated nausea or vomiting. No history of headaches.  She still takes tramadol once daily as needed.   -Noted elevated HR and elevated BP, the latter normal after re-checked. She denies palpitations, chest pain, or dyspnea.    Review of Systems  Constitutional: Positive for fatigue. Negative for activity change, appetite change, fever and unexpected weight change.  HENT: Negative for dental problem, mouth sores, nosebleeds and trouble swallowing.   Eyes: Negative for photophobia, redness and visual disturbance.  Respiratory: Negative for cough, shortness of breath and wheezing.   Cardiovascular: Negative for chest pain, palpitations and leg swelling.  Gastrointestinal: Negative for abdominal pain, nausea and vomiting.       Negative for changes in bowel habits.  Endocrine: Negative for cold intolerance and heat intolerance.    Genitourinary: Negative for decreased urine volume, difficulty urinating and hematuria.  Musculoskeletal: Positive for back pain. Negative for gait problem.  Skin: Negative for pallor and rash.  Neurological: Positive for tremors and headaches. Negative for seizures, syncope, speech difficulty and weakness.  Psychiatric/Behavioral: Negative for confusion. The patient is nervous/anxious.       Current Outpatient Prescriptions on File Prior to Visit  Medication Sig Dispense Refill  . traMADol (ULTRAM) 50 MG tablet Take 50 mg by mouth every 6 (six) hours as needed for moderate pain.     No current facility-administered medications on file prior to visit.      Past Medical History:  Diagnosis Date  . Anemia    years ago  . Anxiety   . Arthritis   . Asthma    bronchitis  . Back pain    lumbar  . Carpal tunnel syndrome   . Complication of anesthesia   . Family history of adverse reaction to anesthesia    mother's blood pressure always drops   . GERD (gastroesophageal reflux disease)   . History of hiatal hernia   . Hyperlipidemia   . Perimenopausal   . PONV (postoperative nausea and vomiting)    Allergies  Allergen Reactions  . Codeine Nausea And Vomiting    Social History   Social History  . Marital status: Married    Spouse name: N/A  . Number of children: N/A  . Years of education: N/A   Social History Main Topics  . Smoking status: Former Smoker    Quit date: 02/21/1988  . Smokeless tobacco: Never Used  . Alcohol use 0.0 oz/week  Comment: occasional  . Drug use: No  . Sexual activity: No   Other Topics Concern  . None   Social History Narrative  . None    Vitals:   10/06/15 1519  BP: 110/80  Pulse: (!) 108  Resp: 12   Body mass index is 29.72 kg/m.  O2 sat 98% at RA.    Physical Exam  Constitutional: She is oriented to person, place, and time. She appears well-developed. No distress.  HENT:  Head: Atraumatic.  Eyes: Conjunctivae  and EOM are normal. Pupils are equal, round, and reactive to light.  Neck: No thyromegaly present.  Cardiovascular: Regular rhythm.  Tachycardia present.   No murmur heard. Pulses:      Dorsalis pedis pulses are 2+ on the right side, and 2+ on the left side.  Respiratory: Effort normal and breath sounds normal. No respiratory distress.  Musculoskeletal: She exhibits no edema or tenderness.  Lymphadenopathy:    She has no cervical adenopathy.  Neurological: She is alert and oriented to person, place, and time. She has normal strength. She displays tremor (Minimal tremor with intention noted on hands.). Coordination and gait normal.  Pronator drift negative. Antalgic gait, normal based.  Skin: Skin is warm. No erythema.  Psychiatric: Her speech is normal. Her mood appears anxious.  Well groomed, good eye contact.      ASSESSMENT AND PLAN:   Lab Results  Component Value Date   TSH 1.74 10/06/2015   Lab Results  Component Value Date   CREATININE 0.93 10/06/2015   BUN 13 10/06/2015   NA 141 10/06/2015   K 3.9 10/06/2015   CL 104 10/06/2015   CO2 29 10/06/2015   Lab Results  Component Value Date   ALT 24 10/06/2015   AST 17 10/06/2015   ALKPHOS 96 10/06/2015   BILITOT 0.4 10/06/2015     [name] was seen today for follow-up.  Diagnoses and all orders for this visit:  Radicular pain of right lower extremity  Improved after lumbar laminectomy. Explained that full recovering of radicular pain may take 6-12 months. Keep appointment with neurosurgeon on 10/12/2015.  Elevated transaminase level  Further recommendations will be given according to lab results.  -     Hepatic Function Panel  Nonintractable episodic headache, unspecified headache type  ? Tension like headache. Will monitor for now, instructed about warning signs. Keep appt with neurosurgeon.  Anxiety disorder, unspecified  Improved. No changes in Cymbalta. F/U in 4 months.   -     DULoxetine  (CYMBALTA) 60 MG capsule; Take 1 capsule (60 mg total) by mouth daily.  Tremor of both hands  ? Essential tremor. Mild. Some labs ordered today, further recommendations will be given according to lab results. May consider BB next OV. F/U in 4 months.  -     Basic metabolic panel -     TSH         -[name] Ferrell was advised to return sooner than planned today if new concerns arise.       Joanette Silveria G. [place]Jordan, MD  [facility]. [place] office.

## 2015-10-07 ENCOUNTER — Encounter: Payer: Self-pay | Admitting: Family Medicine

## 2015-10-07 LAB — HEPATIC FUNCTION PANEL
ALT: 24 U/L (ref 0–35)
AST: 17 U/L (ref 0–37)
Albumin: 4.2 g/dL (ref 3.5–5.2)
Alkaline Phosphatase: 96 U/L (ref 39–117)
Bilirubin, Direct: 0 mg/dL (ref 0.0–0.3)
Total Bilirubin: 0.4 mg/dL (ref 0.2–1.2)
Total Protein: 6.8 g/dL (ref 6.0–8.3)

## 2015-10-07 LAB — BASIC METABOLIC PANEL
BUN: 13 mg/dL (ref 6–23)
CO2: 29 mEq/L (ref 19–32)
Calcium: 9.6 mg/dL (ref 8.4–10.5)
Chloride: 104 mEq/L (ref 96–112)
Creatinine, Ser: 0.93 mg/dL (ref 0.40–1.20)
GFR: 65.88 mL/min (ref >60.00)
Glucose, Bld: 77 mg/dL (ref 70–99)
Potassium: 3.9 mEq/L (ref 3.5–5.1)
Sodium: 141 mEq/L (ref 135–145)

## 2015-10-07 LAB — TSH: TSH: 1.74 u[IU]/mL (ref 0.35–4.50)

## 2015-10-14 ENCOUNTER — Other Ambulatory Visit: Payer: Self-pay | Admitting: Family Medicine

## 2015-10-19 ENCOUNTER — Telehealth: Payer: Self-pay | Admitting: Family Medicine

## 2015-10-19 MED ORDER — TIZANIDINE HCL 4 MG PO TABS
ORAL_TABLET | ORAL | 0 refills | Status: DC
Start: 2015-10-19 — End: 2016-02-18

## 2015-10-19 NOTE — Telephone Encounter (Signed)
I'm currently working on a prior authorization for her cymbalta. Is it okay to send in some flexoril until that is resolved?

## 2015-10-19 NOTE — Telephone Encounter (Signed)
Rx sent in with cautions.

## 2015-10-19 NOTE — Telephone Encounter (Signed)
Patient aware and given cautions.

## 2015-10-19 NOTE — Telephone Encounter (Signed)
She is on Tramadol as well, which as Cymbalta and Flexeril increases serotonin levels and increases risk of side effects. She can try another muscle relaxant: Methocarbamol, Baclofen, or Zanaflex. Thanks, BJ

## 2015-10-19 NOTE — Telephone Encounter (Signed)
She can try Zanaflex 4 mg tid as needed. # 45/0. It can cause drowsiness, so no driving or engaging on activities that can increase risk of injuries or fall.  Thanks, BJ

## 2015-10-19 NOTE — Telephone Encounter (Signed)
Which one do you prefer and how much do you want sent in?

## 2015-10-19 NOTE — Telephone Encounter (Signed)
Patient requesting refill on Flexoril.  Patient saw Dr. SwazilandJordan for acute back pain on 10/06/2015.  Cymbalta discussed.  Please advise re: Flexoril refill.

## 2015-11-16 ENCOUNTER — Ambulatory Visit: Payer: Self-pay | Admitting: Family Medicine

## 2016-02-05 ENCOUNTER — Ambulatory Visit: Payer: Self-pay | Admitting: Family Medicine

## 2016-02-18 ENCOUNTER — Encounter: Payer: Self-pay | Admitting: Adult Health

## 2016-02-18 ENCOUNTER — Telehealth: Payer: Self-pay | Admitting: Family Medicine

## 2016-02-18 ENCOUNTER — Ambulatory Visit (INDEPENDENT_AMBULATORY_CARE_PROVIDER_SITE_OTHER): Payer: Self-pay | Admitting: Adult Health

## 2016-02-18 VITALS — BP 124/82 | Temp 98.0°F | Ht 65.5 in | Wt 187.7 lb

## 2016-02-18 DIAGNOSIS — J069 Acute upper respiratory infection, unspecified: Secondary | ICD-10-CM

## 2016-02-18 DIAGNOSIS — M545 Low back pain, unspecified: Secondary | ICD-10-CM

## 2016-02-18 MED ORDER — HYDROCODONE-HOMATROPINE 5-1.5 MG/5ML PO SYRP: 5.0000 mL | ORAL_SOLUTION | Freq: Three times a day (TID) | ORAL | 0 refills | Status: DC | PRN

## 2016-02-18 MED ORDER — BENZONATATE 200 MG PO CAPS: 200.0000 mg | ORAL_CAPSULE | Freq: Three times a day (TID) | ORAL | 0 refills | Status: DC | PRN

## 2016-02-18 NOTE — Progress Notes (Signed)
Subjective:    Patient ID: Patricia Ferrell, female    DOB: 07-Oct-1955, 60 y.o.   MRN: [id number]  HPI  60 year old female who presents to the office for an acute issue. She reports that last Thursday she started with a productive cough which has since turned into a dry cough. Currently her symptoms include anorexia, , generalized fatigue, episodes of diarrhea and right  low back pain. Her low back pain started 2 days ago, she denies any trauma or falls. Pain is worse with coughing, twisting and palpation.   She started using Imodium which has resolved the diarrhea.   She denies fevers, nausea or vomiting.   She has sick contacts with bronchitis and gastroenteritis.   She has been using dayquil and nyquil.   Review of Systems See HPI   Past Medical History:  Diagnosis Date  . Anemia    years ago  . Anxiety   . Arthritis   . Asthma    bronchitis  . Back pain    lumbar  . Carpal tunnel syndrome   . Complication of anesthesia   . Family history of adverse reaction to anesthesia    mother's blood pressure always drops   . GERD (gastroesophageal reflux disease)   . History of hiatal hernia   . Hyperlipidemia   . Perimenopausal   . PONV (postoperative nausea and vomiting)     Social History   Social History  . Marital status: Married    Spouse name: N/A  . Number of children: N/A  . Years of education: N/A   Occupational History  . Not on file.   Social History Main Topics  . Smoking status: Former Smoker    Quit date: 02/21/1988  . Smokeless tobacco: Never Used  . Alcohol use 0.0 oz/week     Comment: occasional  . Drug use: No  . Sexual activity: No   Other Topics Concern  . Not on file   Social History Narrative  . No narrative on file    Past Surgical History:  Procedure Laterality Date  . ABDOMINAL HYSTERECTOMY     DUB  . CHOLECYSTECTOMY  06/19/2014  . CHOLECYSTECTOMY N/A 06/19/2014   Procedure: LAPAROSCOPIC CHOLECYSTECTOMY WITH INTRAOPERATIVE  CHOLANGIOGRAM;  Surgeon: Manus RuddMatthew Tsuei, MD;  Location: [facility] OR;  Service: General;  Laterality: N/A;  . COLONOSCOPY    . LUMBAR LAMINECTOMY/DECOMPRESSION MICRODISCECTOMY Right 09/16/2015   Procedure: Right - Lumbar five-sacral one  lumbar laminotomy and microdiscectomy;  Surgeon: Shirlean Kellyobert Nudelman, MD;  Location: [facility];  Service: Neurosurgery;  Laterality: Right;  . OOPHORECTOMY  02/21/94   left    Family History  Problem Relation Age of Onset  . Coronary artery disease Mother   . Heart attack Mother   . Hypertension Mother   . Colon polyps Father     Allergies  Allergen Reactions  . Codeine Nausea And Vomiting    Current Outpatient Prescriptions on File Prior to Visit  Medication Sig Dispense Refill  . traMADol (ULTRAM) 50 MG tablet Take 50 mg by mouth every 6 (six) hours as needed for moderate pain.     No current facility-administered medications on file prior to visit.     BP 124/82   Temp 98 F (36.7 C) (Oral)   Ht 5' 5.5" (1.664 m)   Wt 187 lb 11.2 oz (85.1 kg)   BMI 30.76 kg/m       Objective:   Physical Exam  Constitutional: She is oriented to  person, place, and time. She appears well-developed and well-nourished. No distress.  HENT:  Right Ear: Hearing, tympanic membrane, external ear and ear canal normal.  Left Ear: Hearing, tympanic membrane, external ear and ear canal normal.  Nose: Nose normal. No mucosal edema or rhinorrhea. Right sinus exhibits no maxillary sinus tenderness and no frontal sinus tenderness. Left sinus exhibits no maxillary sinus tenderness and no frontal sinus tenderness.  Mouth/Throat: Uvula is midline. Posterior oropharyngeal edema and posterior oropharyngeal erythema present. No oropharyngeal exudate or tonsillar abscesses.  Neck: Normal range of motion. Neck supple.  Cardiovascular: Normal rate, regular rhythm, normal heart sounds and intact distal pulses.  Exam reveals no gallop and no friction rub.   No murmur  heard. Pulmonary/Chest: Effort normal and breath sounds normal. No respiratory distress. She has no wheezes. She has no rales.  Abdominal: Soft. Bowel sounds are normal. She exhibits no distension and no mass. There is no tenderness. There is no rebound and no guarding.  Musculoskeletal: She exhibits tenderness (right lower back). She exhibits no edema or deformity.  Lymphadenopathy:    She has no cervical adenopathy.  Neurological: She is alert and oriented to person, place, and time.  Skin: Skin is warm and dry. No rash noted. She is not diaphoretic. No erythema. No pallor.  Psychiatric: She has a normal mood and affect. Her behavior is normal. Judgment and thought content normal.  Nursing note and vitals reviewed.     Assessment & Plan:  1. Upper respiratory tract infection, unspecified type - Appears viral in origin  - HYDROcodone-homatropine (HYCODAN) 5-1.5 MG/5ML syrup; Take 5 mLs by mouth every 8 (eight) hours as needed for cough.  Dispense: 120 mL; Refill: 0 - benzonatate (TESSALON) 200 MG capsule; Take 1 capsule (200 mg total) by mouth 3 (three) times daily as needed for cough.  Dispense: 20 capsule; Refill: 0 - Stay hydrated and rest.  - Follow up if no improvement   2. Acute right-sided low back pain without sciatica - appears MSK in nature. Possible Costochondritis  - Advised motrin and heading pad as needed  - Follow up as needed  Shirline Freesory Taurus [place], NP

## 2016-02-18 NOTE — Telephone Encounter (Signed)
See below

## 2016-02-18 NOTE — Telephone Encounter (Signed)
Pt state that she is not able to take hydrocodone (allergic reaction) she just noticed it after getting down the road and would like to have something else.  Pharm:  [facility]

## 2016-02-18 NOTE — Telephone Encounter (Signed)
She can use OTC Delsym

## 2016-02-18 NOTE — Telephone Encounter (Signed)
Patient notified and verbalized understanding. 

## 2016-04-01 IMAGING — CR DG CHEST 1V PORT
1 series · 1 of 1 positions shown · non-contrast
Comparison: None.

CLINICAL DATA: Abdominal and chest pain

EXAM:
PORTABLE CHEST - 1 VIEW

[AP]
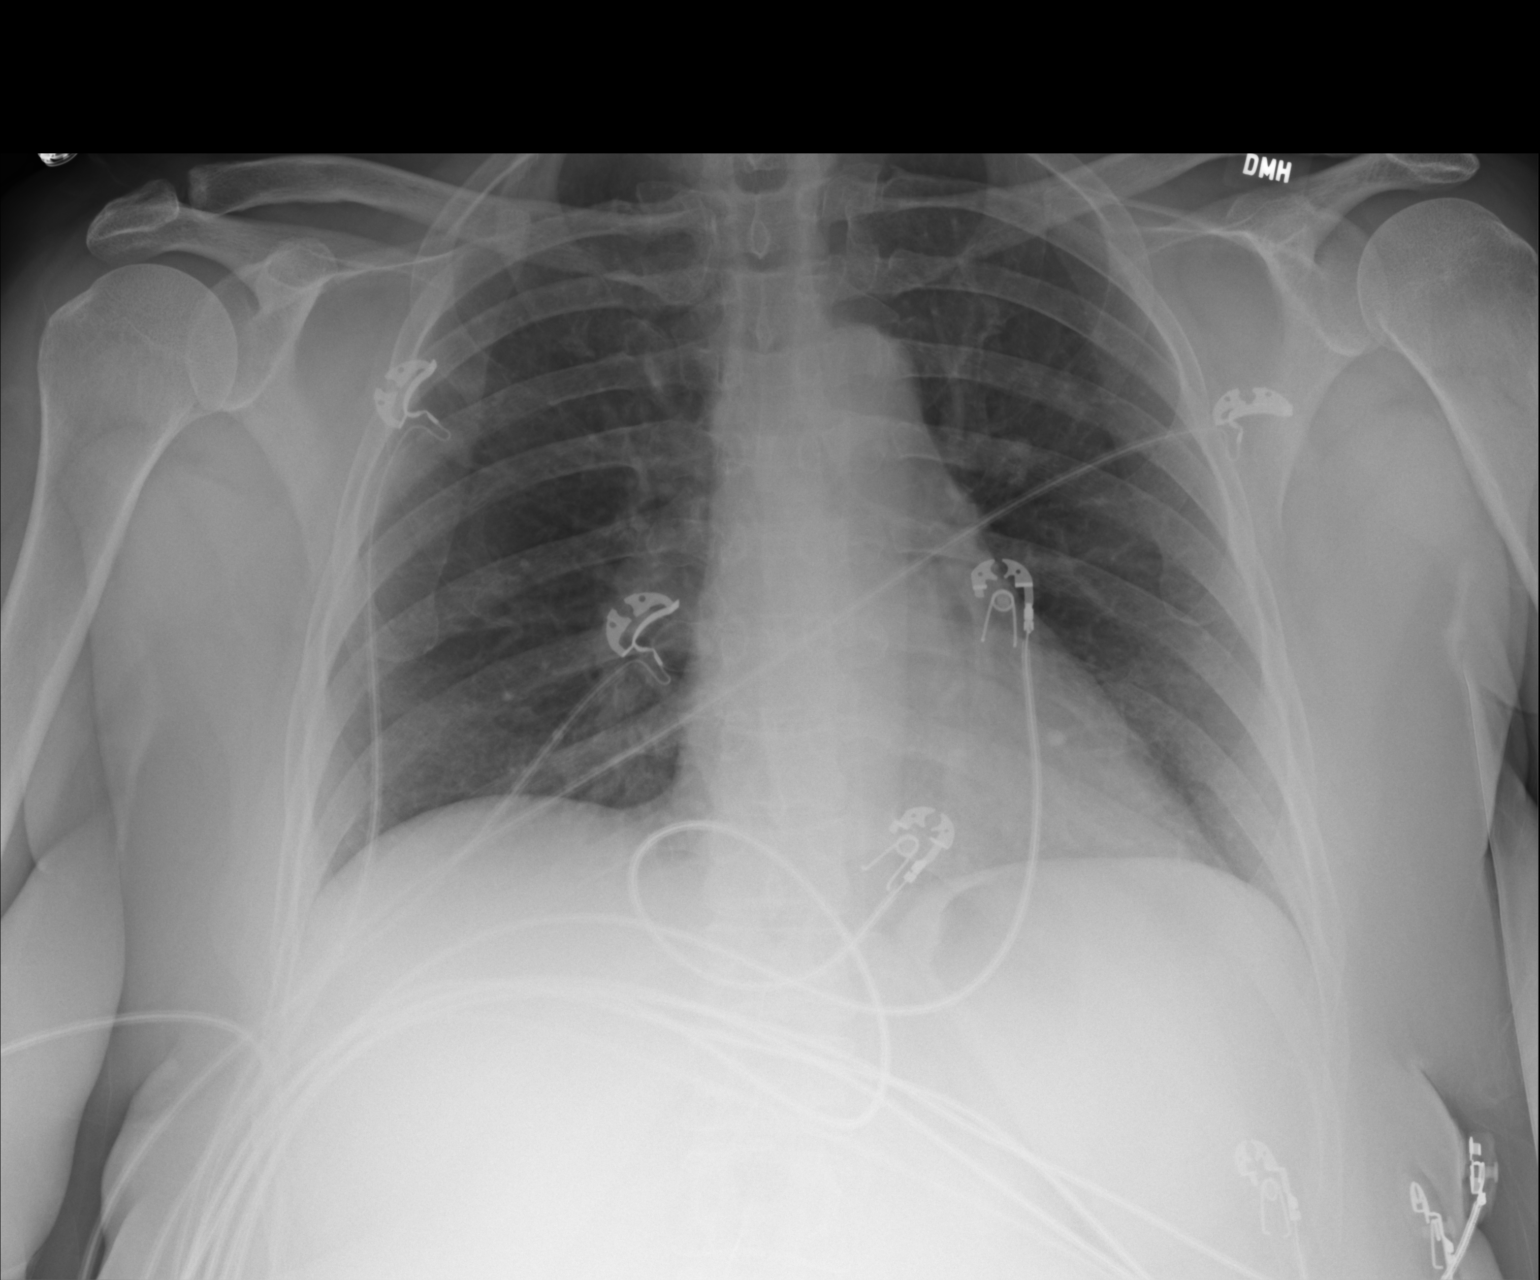

[1 of 1 positions shown; findings below may reference images not displayed]

FINDINGS: Normal heart size and mediastinal contours. No acute infiltrate or
edema. No effusion or pneumothorax. No acute osseous findings.
IMPRESSION: Negative portable chest.

## 2016-04-01 IMAGING — US US ABDOMEN LIMITED
1 series · 14 of 25 positions shown · non-contrast
Comparison: None.

CLINICAL DATA: Nausea, vomiting, epigastric pain since 5 a.m..

EXAM:
US ABDOMEN LIMITED - RIGHT UPPER QUADRANT

[Series 1: us abdomen limited · 0.25mm/px · 14 of 42 slices shown]
[im 1/42]
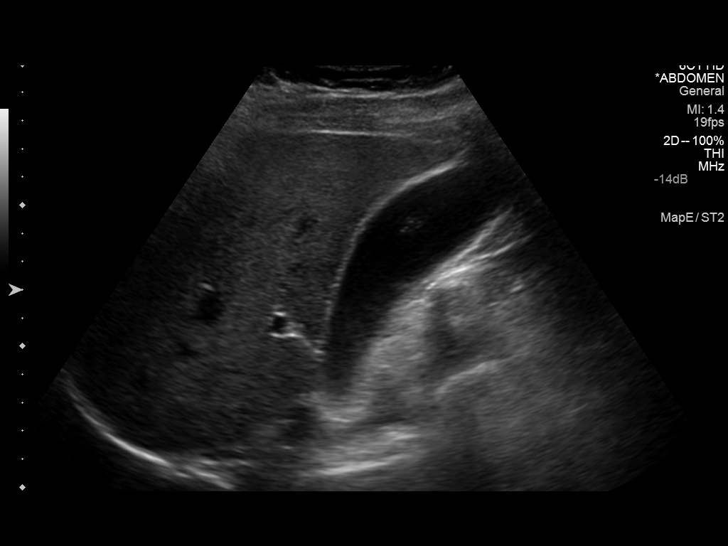
[im 4/42]
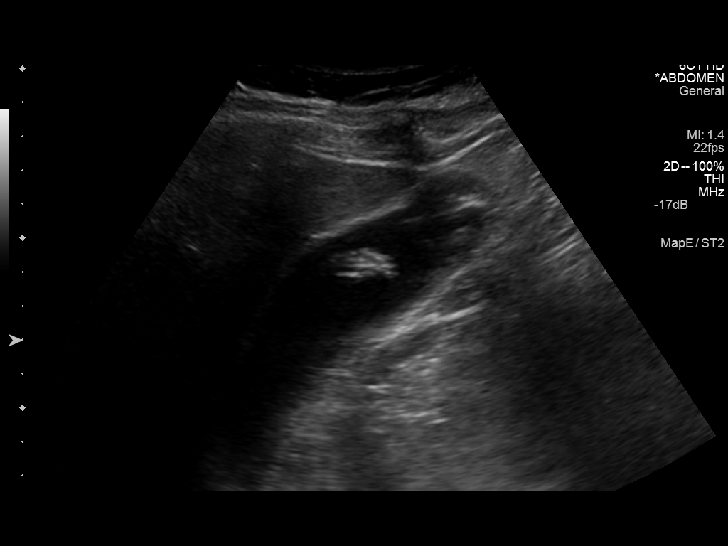
[im 7/42]
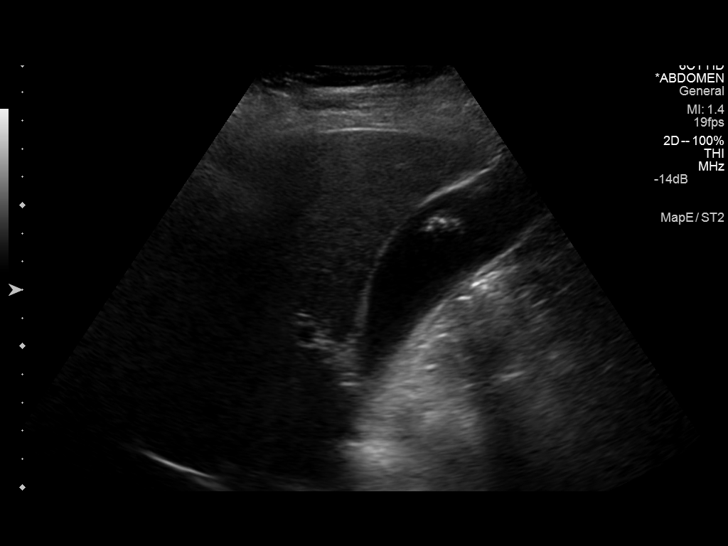
[im 11/42]
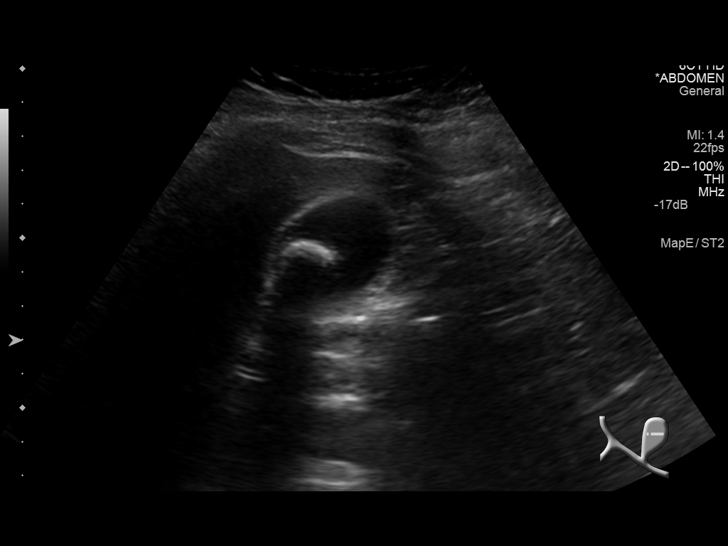
[im 14/42]
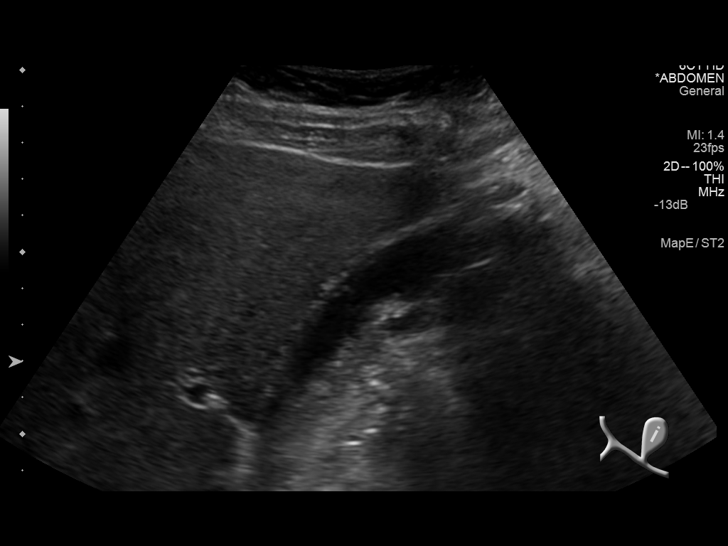
[im 16/42]
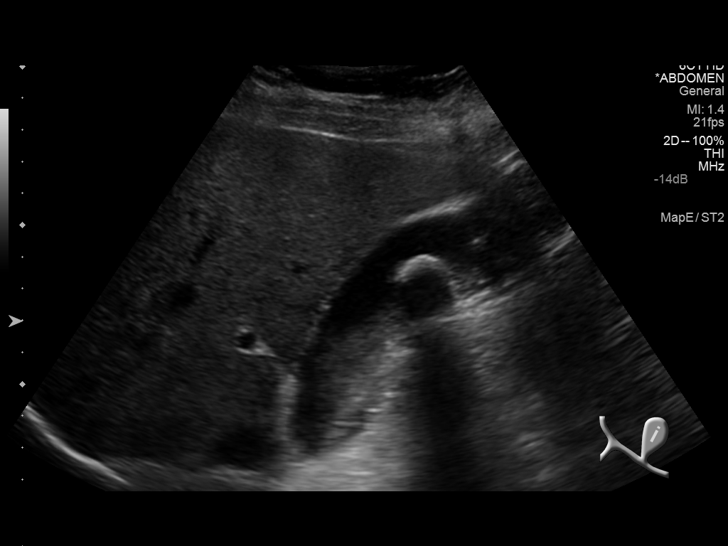
[im 19/42]
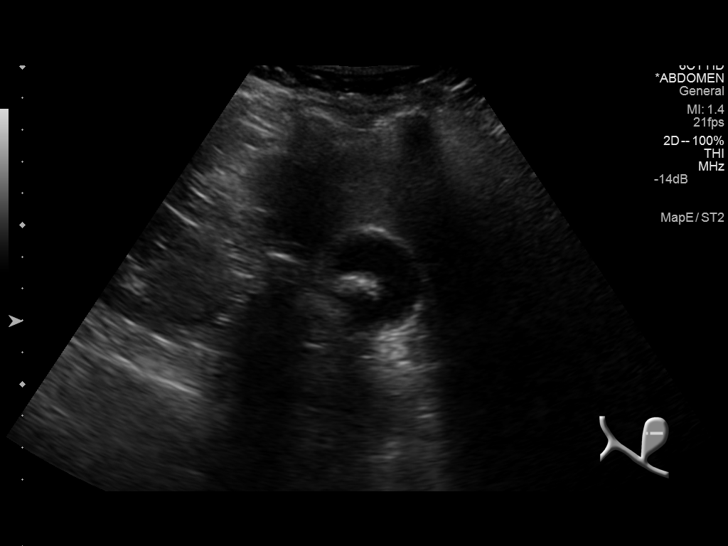
[im 23/42]
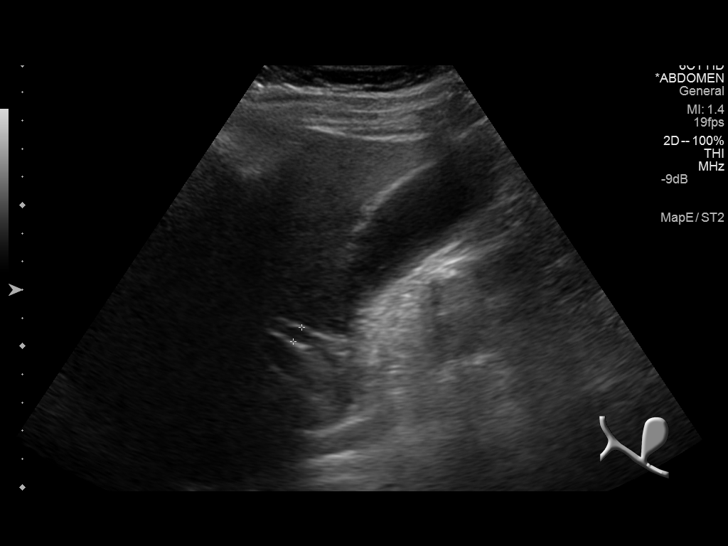
[im 26/42]
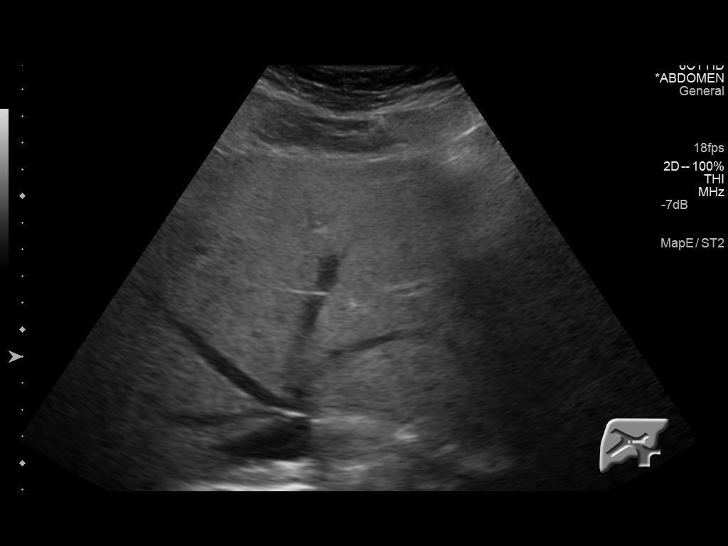
[im 28/42]
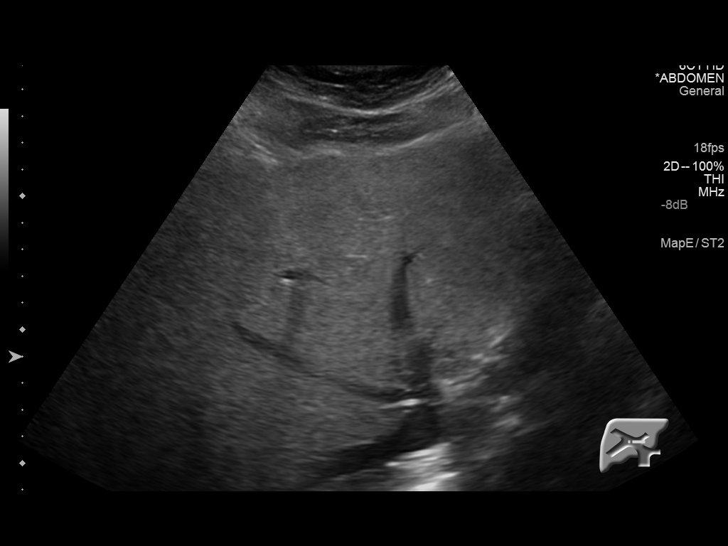
[im 31/42]
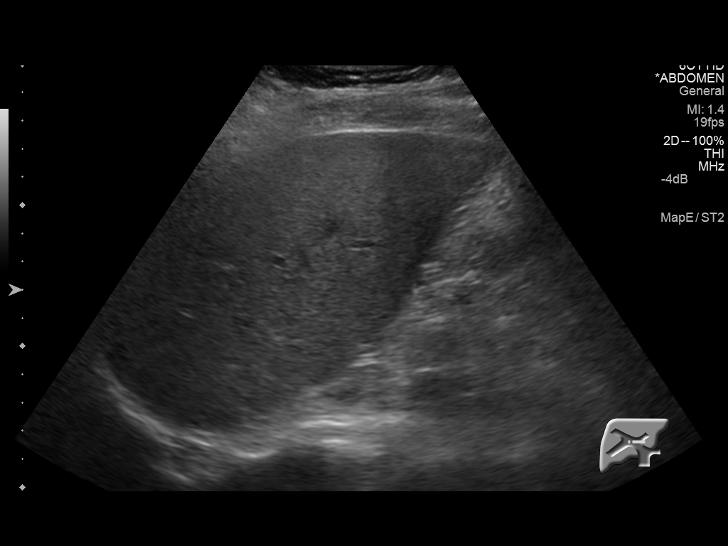
[im 35/42]
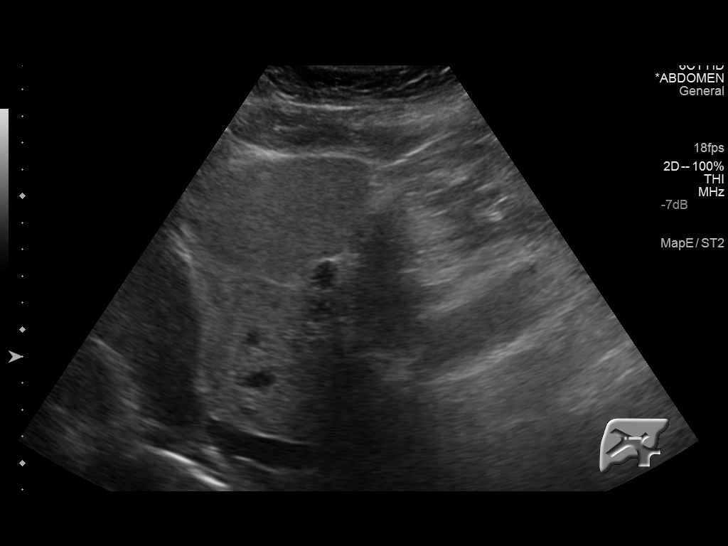
[im 38/42]
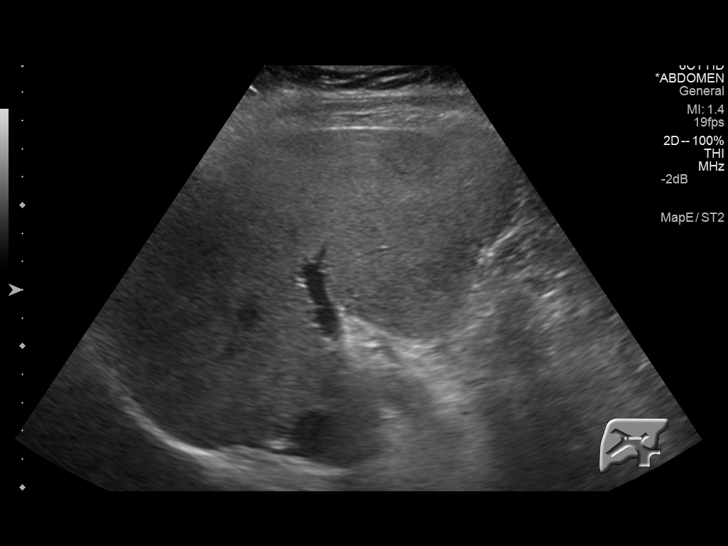
[im 42/42]
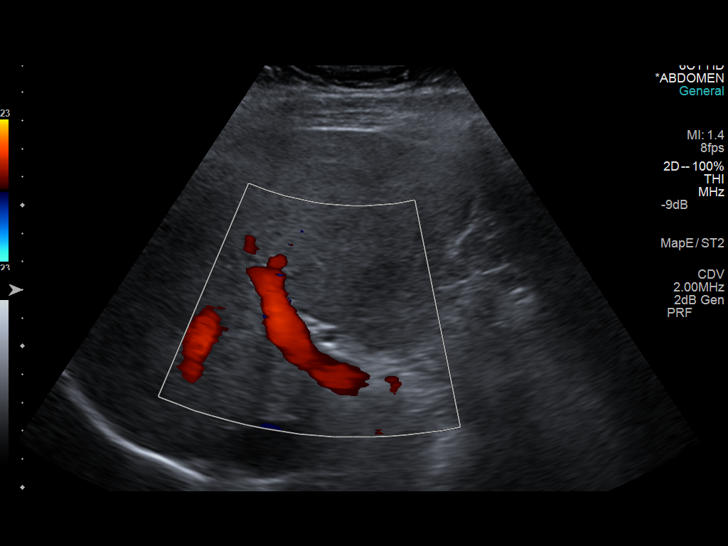

[14 of 25 positions shown; findings below may reference images not displayed]

FINDINGS: Gallbladder:

Multiple gallstones, the largest 1.9 cm. Sludge within the
gallbladder. No wall thickening. Negative sonographic Miguelito.

Common bile duct:

Diameter: Normal caliber, 6 mm

Liver:

No focal lesion identified. Within normal limits in parenchymal
echogenicity.
IMPRESSION: Gallstones and sludge within the gallbladder. No current sonographic
evidence of acute cholecystitis.

## 2016-07-26 ENCOUNTER — Encounter: Payer: Self-pay | Admitting: Family Medicine

## 2016-07-26 ENCOUNTER — Ambulatory Visit (INDEPENDENT_AMBULATORY_CARE_PROVIDER_SITE_OTHER): Payer: Self-pay | Admitting: Family Medicine

## 2016-07-26 VITALS — BP 128/80 | HR 90 | Resp 12 | Ht 65.5 in | Wt 187.4 lb

## 2016-07-26 DIAGNOSIS — H6981 Other specified disorders of Eustachian tube, right ear: Secondary | ICD-10-CM

## 2016-07-26 DIAGNOSIS — J309 Allergic rhinitis, unspecified: Secondary | ICD-10-CM

## 2016-07-26 DIAGNOSIS — H938X1 Other specified disorders of right ear: Secondary | ICD-10-CM

## 2016-07-26 MED ORDER — FLUTICASONE PROPIONATE 50 MCG/ACT NA SUSP: 1.0000 | Freq: Two times a day (BID) | NASAL | 0 refills | Status: DC

## 2016-07-26 NOTE — Patient Instructions (Signed)
  [name] I have seen you today for an acute visit.  A few things to remember from today's visit:   Dysfunction of right eustachian tube  Allergic rhinitis, unspecified seasonality, unspecified trigger - Plan: fluticasone (FLONASE) 50 MCG/ACT nasal spray    Popping ears a few times per day may help. Sudafed 12 hours in the morning for 7 days.  No q tips.    In general please monitor for signs of worsening symptoms and seek immediate medical attention if any concerning.  If symptoms are not resolved in 2 weeks you should schedule a follow up appointment with your doctor, before if needed.

## 2016-07-26 NOTE — Progress Notes (Signed)
HPI:   ACUTE VISIT:  Chief Complaint  Patient presents with  . right ear clogged    Patricia Ferrell is a 61 y.o. female, who is here today complaining of 4 days of feeling like she has something in right ear. States that 5 days ago she worked all day outdoors, mowing her grass. Next morning she "pushed" her hair back and felt like something got in her ear, she tried to get it out using tweezers but nothing came out. Next day she had sudden "clogged" sensation right ear. She asked her daughter to look and no insect or other abnormality seen. Yesterday she started with mild earache.   Ear Fullness   There is pain in the right ear. This is a new problem. The current episode started in the past 7 days. The problem occurs constantly. The problem has been unchanged. There has been no fever. The pain is mild. Associated symptoms include rhinorrhea. Pertinent negatives include no abdominal pain, coughing, diarrhea, ear discharge, headaches, hearing loss, neck pain, rash, sore throat or vomiting. She has tried ear drops (Peroxide, oil) for the symptoms. The treatment provided no relief.   She has Hx of allergic rhinitis, a few days ago she had some right sinus and nostril congestion, better now.   Review of Systems  Constitutional: Negative for chills, fatigue and fever.  HENT: Positive for congestion, ear pain, rhinorrhea and sinus pressure (improved). Negative for ear discharge, hearing loss, postnasal drip, sore throat and trouble swallowing.   Eyes: Negative for redness and visual disturbance.  Respiratory: Negative for cough and stridor.   Gastrointestinal: Negative for abdominal pain, diarrhea and vomiting.  Musculoskeletal: Negative for neck pain.  Skin: Negative for pallor and rash.  Allergic/Immunologic: Positive for environmental allergies.  Neurological: Negative for dizziness and headaches.  Psychiatric/Behavioral: Negative for confusion. The patient is nervous/anxious.        Current Outpatient Prescriptions on File Prior to Visit  Medication Sig Dispense Refill  . traMADol (ULTRAM) 50 MG tablet Take 50 mg by mouth every 6 (six) hours as needed for moderate pain.     No current facility-administered medications on file prior to visit.      Past Medical History:  Diagnosis Date  . Anemia    years ago  . Anxiety   . Arthritis   . Asthma    bronchitis  . Back pain    lumbar  . Carpal tunnel syndrome   . Complication of anesthesia   . Family history of adverse reaction to anesthesia    mother's blood pressure always drops   . GERD (gastroesophageal reflux disease)   . History of hiatal hernia   . Hyperlipidemia   . Perimenopausal   . PONV (postoperative nausea and vomiting)    Allergies  Allergen Reactions  . Codeine Nausea And Vomiting    Social History   Social History  . Marital status: Married    Spouse name: N/A  . Number of children: N/A  . Years of education: N/A   Social History Main Topics  . Smoking status: Former Smoker    Quit date: 02/21/1988  . Smokeless tobacco: Never Used  . Alcohol use 0.0 oz/week     Comment: occasional  . Drug use: No  . Sexual activity: No   Other Topics Concern  . None   Social History Narrative  . None    Vitals:   07/26/16 0938  BP: 128/80  Pulse: 90  Resp: 12  Body mass index is 30.71 kg/m.  Physical Exam  Nursing note and vitals reviewed. Constitutional: She is oriented to person, place, and time. She appears well-developed. She does not appear ill. No distress.  HENT:  Head: Atraumatic.  Right Ear: Tympanic membrane, external ear and ear canal normal. No swelling or tenderness.  Left Ear: Tympanic membrane, external ear and ear canal normal. No swelling or tenderness.  Nose: Rhinorrhea and septal deviation present. Right sinus exhibits no maxillary sinus tenderness and no frontal sinus tenderness. Left sinus exhibits no maxillary sinus tenderness and no frontal  sinus tenderness.  Mouth/Throat: Oropharynx is clear and moist and mucous membranes are normal.  No cerumen in ear canal bilateral.  Eyes: Conjunctivae are normal.  Respiratory: Effort normal and breath sounds normal. No respiratory distress.  Lymphadenopathy:       Head (right side): No submandibular, no preauricular and no posterior auricular adenopathy present.       Head (left side): No submandibular adenopathy present.    She has no cervical adenopathy.  Neurological: She is alert and oriented to person, place, and time. She has normal strength.  Skin: Skin is warm. No rash noted. No erythema.  Psychiatric: Her speech is normal. Her mood appears anxious.  Well groomed, good eye contact.    ASSESSMENT AND PLAN:   Patricia Ferrell was seen today for right ear clogged.  Diagnoses and all orders for this visit:  Ear fullness, right  Ear examination negative otherwise. Instructed to avoid manipulation with tweezers or q tips. Instructed about warning signs.  Dysfunction of right eustachian tube  This may be causing problem. Some treatment options discussed: decongestants and auto inflation maneuvers may help.Some side effects of OTC decongestants discussed.  Allergic rhinitis, unspecified seasonality, unspecified trigger  Intranasal steroid may also help. Nasal irrigations with saline. F/U as needed. -     fluticasone (FLONASE) 50 MCG/ACT nasal spray; Place 1 spray into both nostrils 2 (two) times daily.     >15 min face to face OV. > 50% was dedicated to discussion of Dx, prognosis, treatment options, and some side effects of medications. Reassured.    -[name] advised to seek immediate medical attention is symptoms suddenly get worse or to follow if symptoms persist or new concerns arise.       Betty G. [place], MD  [facility]. [place] office.

## 2016-09-15 ENCOUNTER — Ambulatory Visit (INDEPENDENT_AMBULATORY_CARE_PROVIDER_SITE_OTHER): Payer: Self-pay | Admitting: Family Medicine

## 2016-09-15 ENCOUNTER — Encounter: Payer: Self-pay | Admitting: Family Medicine

## 2016-09-15 VITALS — BP 100/82 | HR 102 | Temp 98.2°F | Ht 65.5 in | Wt 189.9 lb

## 2016-09-15 DIAGNOSIS — M545 Low back pain, unspecified: Secondary | ICD-10-CM

## 2016-09-15 DIAGNOSIS — G8929 Other chronic pain: Secondary | ICD-10-CM

## 2016-09-15 LAB — POCT URINALYSIS DIPSTICK
Bilirubin, UA: NEGATIVE
Blood, UA: NEGATIVE
Glucose, UA: NEGATIVE
Ketones, UA: NEGATIVE
Nitrite, UA: NEGATIVE
Protein, UA: NEGATIVE
Spec Grav, UA: 1.02 (ref 1.010–1.025)
Urobilinogen, UA: 0.2 E.U./dL
pH, UA: 5 (ref 5.0–8.0)

## 2016-09-15 MED ORDER — CYCLOBENZAPRINE HCL 5 MG PO TABS
5.0000 mg | ORAL_TABLET | Freq: Every evening | ORAL | 0 refills | Status: DC | PRN
Start: 2016-09-15 — End: 2019-05-29

## 2016-09-15 NOTE — Patient Instructions (Signed)
Call your back specialist today about your symptoms  Can use the muscle relaxer at night if helps  Heat, tylenol, aleve, topical menthol (tiger balm) as needed per instructions for pain until you see your specialist  I hope you are feeling better soon! Seek care immediately if worsening, new concerns or you are not improving with treatment.

## 2016-09-15 NOTE — Progress Notes (Signed)
HPI:  Acute visit for R low back pain: -for 2 months -hx back pain and lumbar disc surgery with her back specialist -seems to be worsening some - had some numbness in the R leg a few weeks ago briefly that resolved -no fevers, malaise, weakness, numbness (currently), wt loss, known precipitating event -naproxen helps  ROS: See pertinent positives and negatives per HPI.  Past Medical History:  Diagnosis Date  . Anemia    years ago  . Anxiety   . Arthritis   . Asthma    bronchitis  . Back pain    lumbar  . Carpal tunnel syndrome   . Complication of anesthesia   . Family history of adverse reaction to anesthesia    mother's blood pressure always drops   . GERD (gastroesophageal reflux disease)   . History of hiatal hernia   . Hyperlipidemia   . Perimenopausal   . PONV (postoperative nausea and vomiting)     Past Surgical History:  Procedure Laterality Date  . ABDOMINAL HYSTERECTOMY     DUB  . CHOLECYSTECTOMY  06/19/2014  . CHOLECYSTECTOMY N/A 06/19/2014   Procedure: LAPAROSCOPIC CHOLECYSTECTOMY WITH INTRAOPERATIVE CHOLANGIOGRAM;  Surgeon: Manus RuddMatthew Tsuei, MD;  Location: [facility];  Service: General;  Laterality: N/A;  . COLONOSCOPY    . LUMBAR LAMINECTOMY/DECOMPRESSION MICRODISCECTOMY Right 09/16/2015   Procedure: Right - Lumbar five-sacral one  lumbar laminotomy and microdiscectomy;  Surgeon: Shirlean Kellyobert Nudelman, MD;  Location: [facility];  Service: Neurosurgery;  Laterality: Right;  . OOPHORECTOMY  02/21/94   left    Family History  Problem Relation Age of Onset  . Coronary artery disease Mother   . Heart attack Mother   . Hypertension Mother   . Colon polyps Father     Social History   Social History  . Marital status: Married    Spouse name: N/A  . Number of children: N/A  . Years of education: N/A   Social History Main Topics  . Smoking status: Former Smoker    Quit date: 02/21/1988  . Smokeless tobacco: Never Used  . Alcohol use 0.0 oz/week     Comment:  occasional  . Drug use: No  . Sexual activity: No   Other Topics Concern  . None   Social History Narrative  . None     Current Outpatient Prescriptions:  .  naproxen (NAPROSYN) 500 MG tablet, Take 500 mg by mouth as needed., Disp: , Rfl:  .  cyclobenzaprine (FLEXERIL) 5 MG tablet, Take 1 tablet (5 mg total) by mouth at bedtime as needed for muscle spasms., Disp: 20 tablet, Rfl: 0  EXAM:  Vitals:   09/15/16 1306  BP: 100/82  Pulse: (!) 102  Temp: 98.2 F (36.8 C)    Body mass index is 31.12 kg/m.  GENERAL: vitals reviewed and listed above, alert, oriented, appears well hydrated and in no acute distress  HEENT: atraumatic, conjunttiva clear, no obvious abnormalities on inspection of external nose and ears  NECK: no obvious masses on inspection  LUNGS: clear to auscultation bilaterally, no wheezes, rales or rhonchi, good air movement  CV: HRRR, no peripheral edema  MS: moves all extremities without noticeable abnormality  PSYCH: pleasant and cooperative, no obvious depression or anxiety  ASSESSMENT AND PLAN:  Discussed the following assessment and plan:  Chronic right-sided low back pain without sciatica - Plan: POC Urinalysis Dipstick, Culture, Urine  -we discussed possible serious and likely etiologies, workup and treatment, treatment risks and return precautions -Urine dip without blood,  reproducible findings on exam and suspect this is musculoskeletal -after this discussion, [name] opted for follow-up with her specialist, muscle relaxer and over-the-counter symptomatic care in the interim -follow up advised with her specialist; here when necessary -of course, we advised [name]  to return or notify a doctor immediately if symptoms worsen or persist or new concerns arise.  .   Patient Instructions  Call your back specialist today about your symptoms  Can use the muscle relaxer at night if helps  Heat, tylenol, aleve, topical menthol (tiger balm) as needed  per instructions for pain until you see your specialist  I hope you are feeling better soon! Seek care immediately if worsening, new concerns or you are not improving with treatment.     Kriste BasqueKIM, Macenzie Burford R., DO

## 2016-09-16 LAB — URINE CULTURE

## 2016-11-11 ENCOUNTER — Encounter: Payer: Self-pay | Admitting: Family Medicine

## 2017-05-02 ENCOUNTER — Encounter: Payer: Self-pay | Admitting: Family Medicine

## 2017-05-02 ENCOUNTER — Ambulatory Visit: Payer: BLUE CROSS/BLUE SHIELD | Admitting: Family Medicine

## 2017-05-02 VITALS — BP 108/80 | HR 86 | Temp 97.9°F | Wt 190.0 lb

## 2017-05-02 DIAGNOSIS — L820 Inflamed seborrheic keratosis: Secondary | ICD-10-CM | POA: Diagnosis not present

## 2017-05-02 NOTE — Progress Notes (Signed)
Patricia Ferrell is a 62 year old female who comes in today because of an irritated mole  She has actually 2 irritated mole on her left anterior lateral chest wall. The moles have been increasing in size and are irritated or itching.  BP 108/80 (BP Location: Left Arm, Patient Position: Sitting, Cuff Size: Large)   Pulse 86   Temp 97.9 F (36.6 C) (Oral)   Wt 190 lb (86.2 kg)   BMI 31.14 kg/m  Well-developed well-nourished female no acute distress vital signs stable she's afebrile examination chest wall shows 2 abnormal appearing moles. One is small 5 mm x 5 mL and the other is 10 mm's by 12 mm. The raised and irritated and red  She was taken to treatment room after informed consent. The lesions were cleaned with alcohol and removed with a 15 blade. The bases were cauterized Band-Aid were applied. The lesions were not sent for pathologic analysis because of irritated seborrheic keratosis  #1 irritated seborrheic keratosis 2 anterior chest wall,,,,,,,, removed as above

## 2017-05-02 NOTE — Patient Instructions (Signed)
Removed the Band-Aids tomorrow  Return when necessary

## 2017-07-01 IMAGING — CR DG LUMBAR SPINE 2-3V
2 series · 2 of 2 positions shown · non-contrast
Comparison: MRI 07/08/2015

CLINICAL DATA: Lumbar surgery.

EXAM:
LUMBAR SPINE - 2-3 VIEW

[lat (1 of 2)]
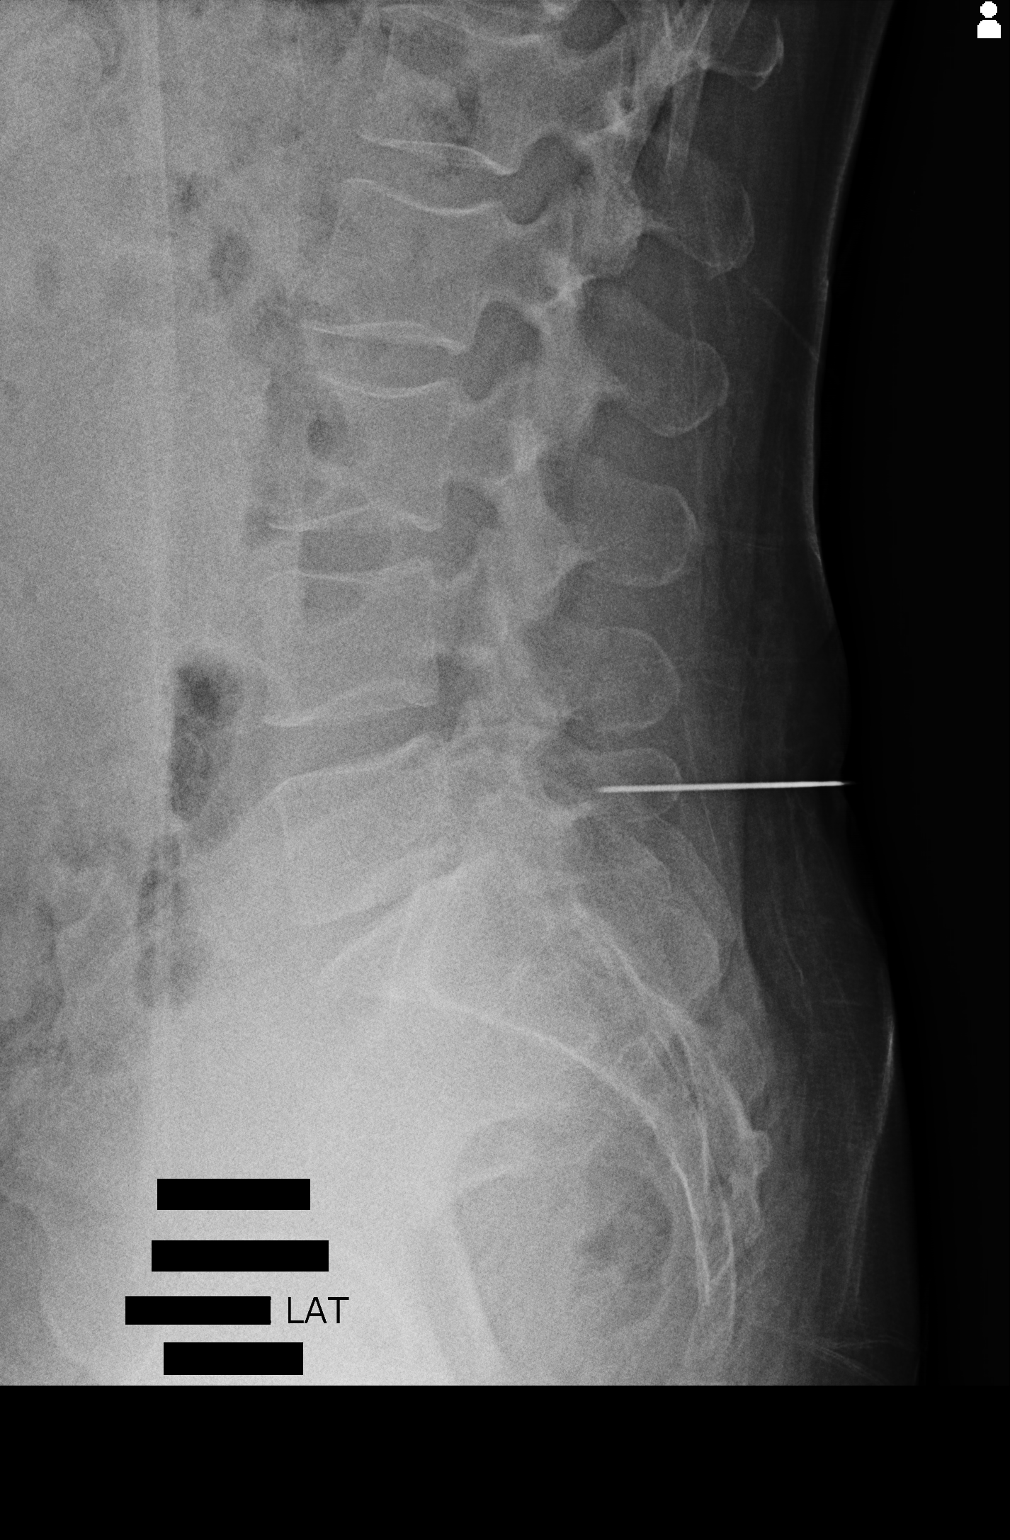

[lat (2 of 2)]
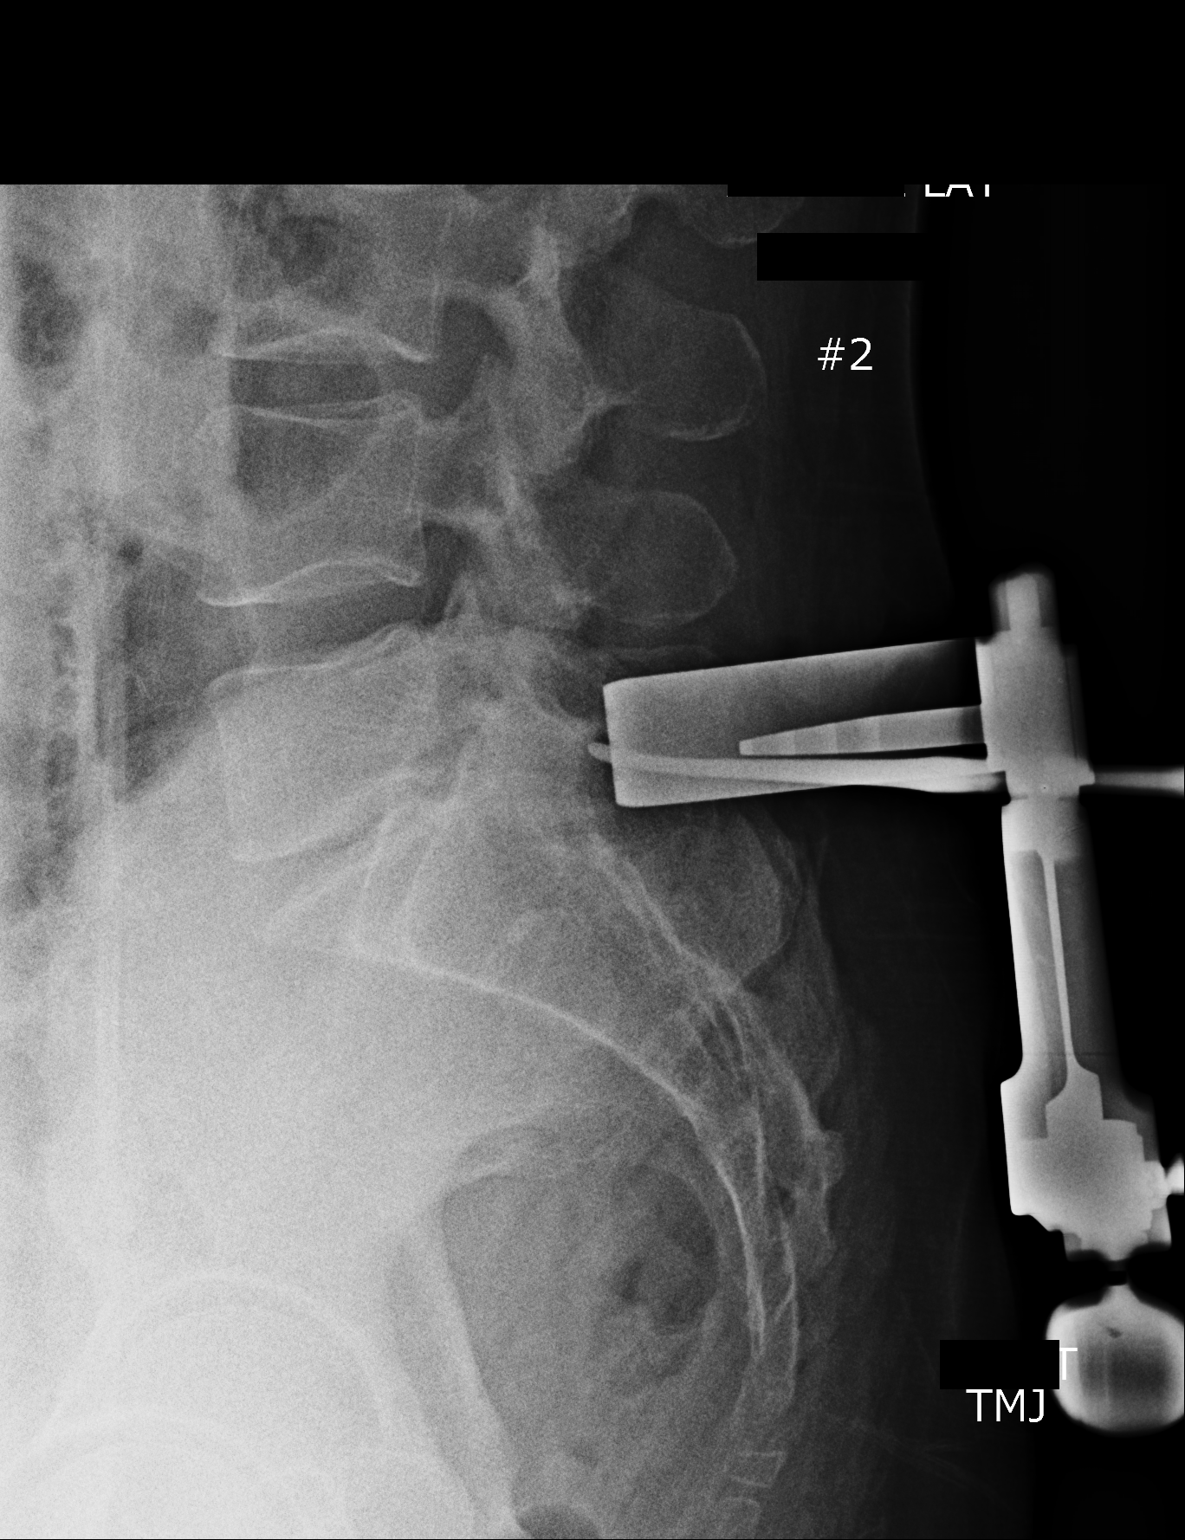

[2 of 2 positions shown; findings below may reference images not displayed]

FINDINGS: Lumbar spine numbered as per prior MRI. Metallic marker noted at the
level of the L5 spinous process. Diffuse degenerative change.
IMPRESSION: Metallic marker noted posteriorly at the level of the L5 spinous
process.

## 2017-11-21 ENCOUNTER — Other Ambulatory Visit: Payer: Self-pay

## 2017-11-21 ENCOUNTER — Ambulatory Visit: Payer: BLUE CROSS/BLUE SHIELD | Attending: Neurosurgery | Admitting: Physical Therapy

## 2017-11-21 ENCOUNTER — Encounter: Payer: Self-pay | Admitting: Physical Therapy

## 2017-11-21 DIAGNOSIS — M6281 Muscle weakness (generalized): Secondary | ICD-10-CM | POA: Diagnosis present

## 2017-11-21 DIAGNOSIS — R293 Abnormal posture: Secondary | ICD-10-CM | POA: Diagnosis present

## 2017-11-21 DIAGNOSIS — M62830 Muscle spasm of back: Secondary | ICD-10-CM | POA: Diagnosis present

## 2017-11-21 DIAGNOSIS — M545 Low back pain, unspecified: Secondary | ICD-10-CM

## 2017-11-21 DIAGNOSIS — G8929 Other chronic pain: Secondary | ICD-10-CM | POA: Insufficient documentation

## 2017-11-21 NOTE — Therapy (Signed)
[facility] [address] [place], [place], [address] Phone: [phone]   Fax:  [phone]  Physical Therapy Evaluation  Patient Details  Name: Patricia Ferrell MRN: [id number] Date of Birth: 09/09/1955 Referring Provider (PT): Shirlean Kelly, MD    Encounter Date: 11/21/2017  PT End of Session - 11/21/17 1242    Visit Number  1    Date for PT Re-Evaluation  01/22/18    Authorization Type   [employer]    Authorization Time Period  11/21/17 to 01/22/18    Authorization - Visit Number  1    Authorization - Number of Visits  10    PT Start Time  0851    PT Stop Time  0930    PT Time Calculation (min)  39 min    Activity Tolerance  Patient tolerated treatment well;No increased pain    Behavior During Therapy  WFL for tasks assessed/performed       Past Medical History:  Diagnosis Date  . Anemia    years ago  . Anxiety   . Arthritis   . Asthma    bronchitis  . Back pain    lumbar  . Carpal tunnel syndrome   . Complication of anesthesia   . Family history of adverse reaction to anesthesia    mother's blood pressure always drops   . GERD (gastroesophageal reflux disease)   . History of hiatal hernia   . Hyperlipidemia   . Perimenopausal   . PONV (postoperative nausea and vomiting)     Past Surgical History:  Procedure Laterality Date  . ABDOMINAL HYSTERECTOMY     DUB  . CHOLECYSTECTOMY  06/19/2014  . CHOLECYSTECTOMY N/A 06/19/2014   Procedure: LAPAROSCOPIC CHOLECYSTECTOMY WITH INTRAOPERATIVE CHOLANGIOGRAM;  Surgeon: Manus Rudd, MD;  Location: [facility];  Service: General;  Laterality: N/A;  . COLONOSCOPY    . LUMBAR LAMINECTOMY/DECOMPRESSION MICRODISCECTOMY Right 09/16/2015   Procedure: Right - Lumbar five-sacral one  lumbar laminotomy and microdiscectomy;  Surgeon: Shirlean Kelly, MD;  Location: [facility];  Service: Neurosurgery;  Laterality: Right;  . OOPHORECTOMY  02/21/94   left    There were no  vitals filed for this visit.   Subjective Assessment - 11/21/17 0854    Subjective  Pt reports having low back pain for over 20 years. She had a lumbar surgery about 2 years ago following sciatic type symptoms. She says that her leg was completely fixed after the surgery but her back still bothers her. At the time, she did not have insurance and was unable to try PT. She now has insurance and was referred to PT to address her low back pain. Dr. Newell Coral saw her 2 weeks ago and gave her an anti-inflammatory which made a big difference in her pain.     Pertinent History  axniety, chronic LBP, Lx laminectomy 2017    Patient Stated Goals  decrease pain with activity     Currently in Pain?  No/denies   just sore currently with medication   Pain Location  Back    Pain Orientation  Right;Left;Lower    Pain Descriptors / Indicators  Aching    Pain Type  Chronic pain    Pain Radiating Towards  none currently     Pain Onset  More than a month ago    Pain Frequency  Constant    Aggravating Factors   alot of activity     Pain Relieving Factors  medication, heat only temporary  Effect of Pain on Daily Activities  limited activity tolerance          [facility] PT Assessment - 11/21/17 0001      Assessment   Medical Diagnosis  LBP with sciatica     Referring Provider (PT)  Shirlean Kelly, MD     Onset Date/Surgical Date  --   2017   Next MD Visit  12/29/17    Prior Therapy  none       Precautions   Precautions  None      Restrictions   Weight Bearing Restrictions  No      Balance Screen   Has the patient fallen in the past 6 months  No    Has the patient had a decrease in activity level because of a fear of falling?   No    Is the patient reluctant to leave their home because of a fear of falling?   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    Leisure  cares for 2 adults with special needs: bathing, walking  with gait belt       Cognition   Overall Cognitive Status  Within Functional Limits for tasks assessed      ROM / Strength   AROM / PROM / Strength  AROM;Strength      AROM   AROM Assessment Site  Lumbar    Lumbar Flexion  decreased lumbar curvature, pain coming back up     Lumbar Extension  limited 75% pain free    Lumbar - Right Rotation  pain free     Lumbar - Left Rotation  pain Lt mid back      Strength   Strength Assessment Site  Hip;Knee;Ankle    Right/Left Hip  Right;Left    Right Hip Flexion  4/5    Right Hip Extension  3/5    Right Hip ABduction  3/5    Left Hip Flexion  4/5    Left Hip Extension  3/5    Left Hip ABduction  3/5    Right/Left Knee  --   Rt and Lt knee flexion 3+/5   Right/Left Ankle  --   ankle DF 5/5      Palpation   Palpation comment  tenderness along lumbar paraspinals, Lt and Rt gluteals                 Objective measurements completed on examination: See above findings.      [facility] Adult PT Treatment/Exercise - 11/21/17 0001      Exercises   Exercises  Lumbar      Lumbar Exercises: Stretches   Piriformis Stretch  1 rep;Left;Right;30 seconds    Piriformis Stretch Limitations  supine     Other Lumbar Stretch Exercise  glute stretch 1x30 sec each side      Lumbar Exercises: Supine   Other Supine Lumbar Exercises  low trunk rotation x10 reps each direction             PT Education - 11/21/17 1240    Education Details  eval findings/POC; HEP implemented and reviewed; importance of allowing movement    Person(s) Educated  Patient    Methods  Explanation;Verbal cues;Handout    Comprehension  Verbalized understanding;Returned demonstration       PT Short Term Goals - 11/21/17 0933      PT SHORT TERM GOAL #1  Title  Pt will demo consistency and independence with her initial HEP to improve flexibility and decrease pain.    Time  4    Period  Weeks    Status  New    Target Date  12/19/17      PT SHORT TERM GOAL  #2   Title  Pt will demo proper set up and use of lumbar roll for additional support while sitting throughout the day.     Time  4    Period  Weeks    Status  New      PT SHORT TERM GOAL #3   Title  Pt will report atleast 25% reduction in her pain from the start of PT which will promote increased participation in activity in the community.    Time  4    Period  Weeks    Status  New        PT Long Term Goals - 11/21/17 0934      PT LONG TERM GOAL #1   Title  Pt will demo improved BLE strength to atleast 4/5 MMT which will improve safety and efficiency with her daily activity.     Time  8    Period  Weeks    Status  New    Target Date  01/22/18      PT LONG TERM GOAL #2   Title  Pt will report improved activity participation evident by her ability to complete walking for 15 min atleast 3 days a week.     Time  8    Period  Weeks    Status  New      PT LONG TERM GOAL #3   Title  Pt will report atleast 50% improvement in her low back pain from the start of PT which will increase her activity participation throughout the day.     Time  8    Period  Weeks    Status  New      PT LONG TERM GOAL #4   Title  Pt will demo being able to lift a small box from the floor without increase in low back pain and with proper mechanics, 3/5 trials.     Time  8    Period  Weeks    Status  New             Plan - 11/21/17 1242    Clinical Impression Statement  Pt is a pleasant 62 y.o F referred to OPPT with complaints of mechanical low back pain of chronic nature. She had lumbar surgery approximately 2 years ago and has resolved RLE symptoms since then. Her low back continues to limit her daily participation at home and work. She presents with limitations in lumbar active ROM, limited glute strength, as well as poor posture awareness and trunk strength evident during her attempts to complete bed mobility during today's evaluation. She requires heavy encouragement to increase movement and  would benefit from education on the importance of movement and correct posture with daily activity. Pt would also benefit from skilled PT to address her limitations in trunk/LE strength, improve flexibility and facilitate her return full participation at home and work activity.     History and Personal Factors relevant to plan of care:  Lx surgery 2017    Clinical Presentation  Stable    Clinical Decision Making  Low    Rehab Potential  Good    PT Frequency  2x / week  PT Duration  8 weeks    PT Treatment/Interventions  ADLs/Self Care Home Management;Moist Heat;Electrical Stimulation;Cryotherapy;Therapeutic exercise;Patient/family education;Manual techniques;Passive range of motion;Therapeutic activities;Neuromuscular re-education;Balance training;Dry needling    PT Next Visit Plan  gentle lumbar mobility; gentle hip strengthening; sitting posture and lumbar roll     PT Home Exercise Plan  Access Code: [email or web address]     Consulted and Agree with Plan of Care  Patient       Patient will benefit from skilled therapeutic intervention in order to improve the following deficits and impairments:  Decreased activity tolerance, Decreased strength, Impaired flexibility, Postural dysfunction, Pain, Decreased safety awareness, Improper body mechanics, Obesity, Decreased range of motion, Decreased endurance, Decreased mobility, Increased muscle spasms  Visit Diagnosis: Chronic bilateral low back pain, unspecified whether sciatica present  Muscle weakness (generalized)  Abnormal posture  Muscle spasm of back     Problem List Patient Active Problem List   Diagnosis Date Noted  . Seborrheic keratosis, inflamed 05/02/2017  . HNP (herniated nucleus pulposus), lumbar 09/16/2015  . Radicular pain of right lower extremity 07/24/2015  . Calculus of gallbladder with acute cholecystitis without obstruction 06/20/2014  . Abdominal pain 06/17/2014  . Diarrhea 06/17/2014  . Allergic rhinitis 09/08/2008  .  Anxiety disorder, unspecified 09/02/2008  . HYPERLIPIDEMIA 02/18/2008  . 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 PERIMENOPAUSAL SYNDROME 02/06/2008  . CARPAL TUNNEL SYNDROME, BILATERAL, HX OF 02/06/2008  . ASTHMA 08/21/2007  . SEBORRHEIC KERATOSIS 08/21/2007  . DISC DISEASE, LUMBAR 07/25/2007  . EXTRINSIC ASTHMA, UNSPECIFIED 05/04/2007   12:57 PM,11/21/17 Donita Brooks PT, DPT [facility]  [phone]  [facility] [address] [place], [place], [address] Phone: [phone]   Fax:  [phone]  Name: Sunshyne Horvath MRN: [id number] Date of Birth: 03-23-1955

## 2017-11-21 NOTE — Patient Instructions (Signed)
Access Code: [email or web address]  URL: [email or web address]  Date: 11/21/2017  Prepared by: Donita Brooks   Exercises  Supine Figure 4 Piriformis Stretch - 10 reps - 3 sets - 2x daily - 7x weekly  Supine Gluteus Stretch - 10 reps - 3 sets - 2x daily - 7x weekly  Supine Lower Trunk Rotation - 10 reps - 2x daily - 7x weekly    [facility] [address], [address] [place], [place] [address] Phone # [phone] Fax [phone]

## 2017-11-24 ENCOUNTER — Ambulatory Visit: Payer: BLUE CROSS/BLUE SHIELD | Admitting: Physical Therapy

## 2017-11-24 ENCOUNTER — Encounter: Payer: Self-pay | Admitting: Physical Therapy

## 2017-11-24 DIAGNOSIS — M6281 Muscle weakness (generalized): Secondary | ICD-10-CM

## 2017-11-24 DIAGNOSIS — M545 Low back pain, unspecified: Secondary | ICD-10-CM

## 2017-11-24 DIAGNOSIS — R293 Abnormal posture: Secondary | ICD-10-CM

## 2017-11-24 DIAGNOSIS — G8929 Other chronic pain: Secondary | ICD-10-CM

## 2017-11-24 NOTE — Therapy (Signed)
[facility] [address] [place], [place], [address] Phone: [phone]   Fax:  [phone]  Physical Therapy Treatment  Patient Details  Name: Patricia Ferrell MRN: [id number] Date of Birth: 08-24-1955 Referring Provider (PT): Shirlean Kelly, MD    Encounter Date: 11/24/2017  PT End of Session - 11/24/17 1238    Visit Number  2    Date for PT Re-Evaluation  01/22/18    Authorization Time Period  11/21/17 to 01/22/18    Authorization - Visit Number  2    Authorization - Number of Visits  10    PT Start Time  1017    PT Stop Time  1101    PT Time Calculation (min)  44 min    Activity Tolerance  Patient tolerated treatment well;No increased pain    Behavior During Therapy  WFL for tasks assessed/performed       Past Medical History:  Diagnosis Date  . Anemia    years ago  . Anxiety   . Arthritis   . Asthma    bronchitis  . Back pain    lumbar  . Carpal tunnel syndrome   . Complication of anesthesia   . Family history of adverse reaction to anesthesia    mother's blood pressure always drops   . GERD (gastroesophageal reflux disease)   . History of hiatal hernia   . Hyperlipidemia   . Perimenopausal   . PONV (postoperative nausea and vomiting)     Past Surgical History:  Procedure Laterality Date  . ABDOMINAL HYSTERECTOMY     DUB  . CHOLECYSTECTOMY  06/19/2014  . CHOLECYSTECTOMY N/A 06/19/2014   Procedure: LAPAROSCOPIC CHOLECYSTECTOMY WITH INTRAOPERATIVE CHOLANGIOGRAM;  Surgeon: Manus Rudd, MD;  Location: [facility];  Service: General;  Laterality: N/A;  . COLONOSCOPY    . LUMBAR LAMINECTOMY/DECOMPRESSION MICRODISCECTOMY Right 09/16/2015   Procedure: Right - Lumbar five-sacral one  lumbar laminotomy and microdiscectomy;  Surgeon: Shirlean Kelly, MD;  Location: [facility];  Service: Neurosurgery;  Laterality: Right;  . OOPHORECTOMY  02/21/94   left    There were no vitals filed for this  visit.  Subjective Assessment - 11/24/17 1019    Subjective  Pt reports she is feeling good today.  Has been doing the exercises from the eval but gets interrupted a lot at home and may not be doing them as much as she should.    Pertinent History  axniety, chronic LBP, Lx laminectomy 2017    Currently in Pain?  No/denies                       [facility] Adult PT Treatment/Exercise - 11/24/17 0001      Exercises   Exercises  Lumbar      Lumbar Exercises: Stretches   Piriformis Stretch  1 rep;Left;Right;30 seconds    Piriformis Stretch Limitations  supine     Other Lumbar Stretch Exercise  glute stretch 1x30 sec each side      Lumbar Exercises: Standing   Functional Squats  15 reps    Functional Squats Limitations  PT provided cues to use hip hinge and abductor opening at knees    Other Standing Lumbar Exercises  yellow tband reactive isometrics anti-rotation of trunk   Pt not as aware of core during this exercise      Lumbar Exercises: Supine   Ab Set  5 reps    AB Set Limitations  10 sec, with pelvic  floor lift    Pelvic Tilt  5 reps    Pelvic Tilt Limitations  PT cued to find neutral pelvis after tilts    Glut Set  5 seconds;10 reps    Clam  20 reps    Clam Limitations  red tband    Heel Slides  10 reps    Bent Knee Raise  10 reps   bil   Bent Knee Raise Limitations  PT cued to maintain neutral pelvis with core contraction    Basic Lumbar Stabilization  Compliant    Basic Lumbar Stabilization Limitations  bent knee fallout, marching, heel slide 5x bil for each   PT verbally cued pelvic floor, TrA, lumbar multifidus   Other Supine Lumbar Exercises  low trunk rotation x10 reps each direction      Lumbar Exercises: Sidelying   Other Sidelying Lumbar Exercises  open books for thoracic rotation x 5 bil             PT Education - 11/24/17 1238    Education Details  Access Code: [email or web address]    Person(s) Educated  Patient    Methods   Explanation;Demonstration;Verbal cues;Handout    Comprehension  Verbalized understanding;Returned demonstration       PT Short Term Goals - 11/21/17 0933      PT SHORT TERM GOAL #1   Title  Pt will demo consistency and independence with her initial HEP to improve flexibility and decrease pain.    Time  4    Period  Weeks    Status  New    Target Date  12/19/17      PT SHORT TERM GOAL #2   Title  Pt will demo proper set up and use of lumbar roll for additional support while sitting throughout the day.     Time  4    Period  Weeks    Status  New      PT SHORT TERM GOAL #3   Title  Pt will report atleast 25% reduction in her pain from the start of PT which will promote increased participation in activity in the community.    Time  4    Period  Weeks    Status  New        PT Long Term Goals - 11/21/17 0934      PT LONG TERM GOAL #1   Title  Pt will demo improved BLE strength to atleast 4/5 MMT which will improve safety and efficiency with her daily activity.     Time  8    Period  Weeks    Status  New    Target Date  01/22/18      PT LONG TERM GOAL #2   Title  Pt will report improved activity participation evident by her ability to complete walking for 15 min atleast 3 days a week.     Time  8    Period  Weeks    Status  New      PT LONG TERM GOAL #3   Title  Pt will report atleast 50% improvement in her low back pain from the start of PT which will increase her activity participation throughout the day.     Time  8    Period  Weeks    Status  New      PT LONG TERM GOAL #4   Title  Pt will demo being able to lift a small box from the floor without increase in low  back pain and with proper mechanics, 3/5 trials.     Time  8    Period  Weeks    Status  New            Plan - 11/24/17 1239    Clinical Impression Statement  Pt demonstrated improved core muscle awareness in supine hooklying today.  She was able to stabilize her pelvis and maintain neutral while  superimposing LE movements.  She wasn't as aware of these muscles in standing which will be a good target to work on in future appointments.  PT spent time demonstrating and cueing functional squat technique with verbal cues to use hip hinge and core.  Pt found greater ease of effort when PT cued use of hip abduction exercise movement during sit to stand.  Pt will continue to benefit from skilled progression of lumbopelvic stabilization, closed chain LE strengthening and functional training for body mechanics to address her deficits and protect her back during daily demands.    PT Frequency  2x / week    PT Duration  8 weeks    PT Treatment/Interventions  ADLs/Self Care Home Management;Moist Heat;Electrical Stimulation;Cryotherapy;Therapeutic exercise;Patient/family education;Manual techniques;Passive range of motion;Therapeutic activities;Neuromuscular re-education;Balance training;Dry needling    PT Next Visit Plan  gentle lumbar mobility; gentle hip strengthening; sitting posture and lumbar roll     PT Home Exercise Plan  Access Code: [email or web address]        Patient will benefit from skilled therapeutic intervention in order to improve the following deficits and impairments:  Decreased activity tolerance, Decreased strength, Impaired flexibility, Postural dysfunction, Pain, Decreased safety awareness, Improper body mechanics, Obesity, Decreased range of motion, Decreased endurance, Decreased mobility, Increased muscle spasms  Visit Diagnosis: Chronic bilateral low back pain, unspecified whether sciatica present  Muscle weakness (generalized)  Abnormal posture     Problem List Patient Active Problem List   Diagnosis Date Noted  . Seborrheic keratosis, inflamed 05/02/2017  . HNP (herniated nucleus pulposus), lumbar 09/16/2015  . Radicular pain of right lower extremity 07/24/2015  . Calculus of gallbladder with acute cholecystitis without obstruction 06/20/2014  . Abdominal pain 06/17/2014  .  Diarrhea 06/17/2014  . Allergic rhinitis 09/08/2008  . Anxiety disorder, unspecified 09/02/2008  . HYPERLIPIDEMIA 02/18/2008  . 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 PERIMENOPAUSAL SYNDROME 02/06/2008  . CARPAL TUNNEL SYNDROME, BILATERAL, HX OF 02/06/2008  . ASTHMA 08/21/2007  . SEBORRHEIC KERATOSIS 08/21/2007  . DISC DISEASE, LUMBAR 07/25/2007  . EXTRINSIC ASTHMA, UNSPECIFIED 05/04/2007   Morton Peters, PT 11/24/17 12:41 PM    [facility] [address] [place], [place], [address] Phone: [phone]   Fax:  [phone]  Name: Patricia Ferrell MRN: [id number] Date of Birth: 05-08-1955

## 2017-11-24 NOTE — Patient Instructions (Signed)
Access Code: [email or web address]  URL: [email or web address]  Date: 11/24/2017  Prepared by: Loistine Simas Aidenn Skellenger   Exercises  Supine Figure 4 Piriformis Stretch - 10 reps - 3 sets - 2x daily - 7x weekly  Supine Gluteus Stretch - 10 reps - 3 sets - 2x daily - 7x weekly  Supine Lower Trunk Rotation - 10 reps - 2x daily - 7x weekly  Hooklying Gluteal Sets - 10 reps - 2 sets - 5 hold - 1x daily - 7x weekly  Bent Knee Fallouts - 10 reps - 2 sets - 1x daily - 7x weekly  Supine Heel Slides - 10 reps - 2 sets - 1x daily - 7x weekly  Supine March - 10 reps - 2 sets - 1x daily - 7x weekly  Hooklying Isometric Clamshell - 20 reps - 1 sets - 3 hold - 1x daily - 7x weekly

## 2017-12-05 ENCOUNTER — Ambulatory Visit: Payer: BLUE CROSS/BLUE SHIELD | Admitting: Physical Therapy

## 2017-12-05 LAB — HM DEXA SCAN

## 2017-12-07 ENCOUNTER — Ambulatory Visit: Payer: BLUE CROSS/BLUE SHIELD | Admitting: Physical Therapy

## 2017-12-07 ENCOUNTER — Encounter: Payer: Self-pay | Admitting: Physical Therapy

## 2017-12-07 DIAGNOSIS — R293 Abnormal posture: Secondary | ICD-10-CM

## 2017-12-07 DIAGNOSIS — M6281 Muscle weakness (generalized): Secondary | ICD-10-CM

## 2017-12-07 DIAGNOSIS — G8929 Other chronic pain: Secondary | ICD-10-CM

## 2017-12-07 DIAGNOSIS — M545 Low back pain, unspecified: Secondary | ICD-10-CM

## 2017-12-07 DIAGNOSIS — M62830 Muscle spasm of back: Secondary | ICD-10-CM

## 2017-12-07 NOTE — Therapy (Signed)
[facility] [address], [place], [place], [address] Phone: [phone]   Fax:  [phone]  Physical Therapy Treatment  Patient Details  Name: Patricia Ferrell MRN: [id number] Date of Birth: 05-Mar-1955 Referring Provider (PT): Jovita Gamma, MD    Encounter Date: 12/07/2017  PT End of Session - 12/07/17 1208    Visit Number  3    Date for PT Re-Evaluation  01/22/18    Authorization Time Period  11/21/17 to 01/22/18    Authorization - Visit Number  3    Authorization - Number of Visits  10    PT Start Time  0300    PT Stop Time  1142    PT Time Calculation (min)  40 min    Activity Tolerance  Patient tolerated treatment well;No increased pain    Behavior During Therapy  WFL for tasks assessed/performed       Past Medical History:  Diagnosis Date  . Anemia    years ago  . Anxiety   . Arthritis   . Asthma    bronchitis  . Back pain    lumbar  . Carpal tunnel syndrome   . Complication of anesthesia   . Family history of adverse reaction to anesthesia    mother's blood pressure always drops   . GERD (gastroesophageal reflux disease)   . History of hiatal hernia   . Hyperlipidemia   . Perimenopausal   . PONV (postoperative nausea and vomiting)     Past Surgical History:  Procedure Laterality Date  . ABDOMINAL HYSTERECTOMY     DUB  . CHOLECYSTECTOMY  06/19/2014  . CHOLECYSTECTOMY N/A 06/19/2014   Procedure: LAPAROSCOPIC CHOLECYSTECTOMY WITH INTRAOPERATIVE CHOLANGIOGRAM;  Surgeon: Donnie Mesa, MD;  Location: [facility];  Service: General;  Laterality: N/A;  . COLONOSCOPY    . LUMBAR LAMINECTOMY/DECOMPRESSION MICRODISCECTOMY Right 09/16/2015   Procedure: Right - Lumbar five-sacral one  lumbar laminotomy and microdiscectomy;  Surgeon: Jovita Gamma, MD;  Location: [facility];  Service: Neurosurgery;  Laterality: Right;  . OOPHORECTOMY  02/21/94   left    There were no vitals filed for this  visit.  Subjective Assessment - 12/07/17 1104    Subjective  Pt reports that she is doing well. She has not been able to keep up with her HEP and thinks that she left without getting a copy.     Pertinent History  axniety, chronic LBP, Lx laminectomy 2017    Currently in Pain?  No/denies                       [facility] Adult PT Treatment/Exercise - 12/07/17 0001      Exercises   Exercises  Lumbar      Lumbar Exercises: Stretches   Hip Flexor Stretch  2 reps;Left;Right;20 seconds    Hip Flexor Stretch Limitations  standing    Piriformis Stretch  2 reps;Left;Right;30 seconds    Piriformis Stretch Limitations  supine       Lumbar Exercises: Aerobic   Nustep  x4 min intervals from L1 to L4 every 60 sec      Lumbar Exercises: Standing   Other Standing Lumbar Exercises  sidestepping 3x3 steps Lt/Rt, yellow TB around feet for 2 trials.      Lumbar Exercises: Seated   Other Seated Lumbar Exercises  abdominal brace with BUE press into foam roll x15 reps       Lumbar Exercises: Supine   Clam  15  reps    Clam Limitations  green TB x2 sets on Lt     Bent Knee Raise  15 reps    Bent Knee Raise Limitations  tactile/verbal cues for proper abdominal activation    Bridge  5 reps    Bridge Limitations  x2 sets, green TB across pelvis to improve glute activation    Isometric Hip Flexion  10 reps;3 seconds    Isometric Hip Flexion Limitations  press into green physioball for LLE and RUE    Other Supine Lumbar Exercises  straight leg raise with each LE x10 reps       Lumbar Exercises: Sidelying   Other Sidelying Lumbar Exercises  thoracic rotation open book stretch x10 reps each             PT Education - 12/07/17 1207    Education Details  technique with therex    Person(s) Educated  Patient    Methods  Explanation;Verbal cues    Comprehension  Returned demonstration;Verbalized understanding       PT Short Term Goals - 12/07/17 1227      PT SHORT TERM GOAL #1    Title  Pt will demo consistency and independence with her initial HEP to improve flexibility and decrease pain.    Time  4    Period  Weeks    Status  Partially Met      PT SHORT TERM GOAL #2   Title  Pt will demo proper set up and use of lumbar roll for additional support while sitting throughout the day.     Time  4    Period  Weeks    Status  New      PT SHORT TERM GOAL #3   Title  Pt will report atleast 25% reduction in her pain from the start of PT which will promote increased participation in activity in the community.    Time  4    Period  Weeks    Status  On-going        PT Long Term Goals - 11/21/17 0934      PT LONG TERM GOAL #1   Title  Pt will demo improved BLE strength to atleast 4/5 MMT which will improve safety and efficiency with her daily activity.     Time  8    Period  Weeks    Status  New    Target Date  01/22/18      PT LONG TERM GOAL #2   Title  Pt will report improved activity participation evident by her ability to complete walking for 15 min atleast 3 days a week.     Time  8    Period  Weeks    Status  New      PT LONG TERM GOAL #3   Title  Pt will report atleast 50% improvement in her low back pain from the start of PT which will increase her activity participation throughout the day.     Time  8    Period  Weeks    Status  New      PT LONG TERM GOAL #4   Title  Pt will demo being able to lift a small box from the floor without increase in low back pain and with proper mechanics, 3/5 trials.     Time  8    Period  Weeks    Status  New  Plan - 12/07/17 1210    Clinical Impression Statement  Pt was unable to complete HEP since last session, reporting that she lost her copy. Session focused on reviewing her HEP and progressing trunk/LE strengthening exercises. Pt did require some additional cuing for proper mechanics due to compensations from hip weakness during standing hip abduction. Overall, pt reported no increase in low  back pain following today's session and verbalized understanding of HEP additions.     PT Frequency  2x / week    PT Duration  8 weeks    PT Treatment/Interventions  ADLs/Self Care Home Management;Moist Heat;Electrical Stimulation;Cryotherapy;Therapeutic exercise;Patient/family education;Manual techniques;Passive range of motion;Therapeutic activities;Neuromuscular re-education;Balance training;Dry needling    PT Next Visit Plan  gentle lumbar mobility; gentle hip strengthening; sitting posture and lumbar roll     PT Home Exercise Plan  Access Code: [email or web address]        Patient will benefit from skilled therapeutic intervention in order to improve the following deficits and impairments:  Decreased activity tolerance, Decreased strength, Impaired flexibility, Postural dysfunction, Pain, Decreased safety awareness, Improper body mechanics, Obesity, Decreased range of motion, Decreased endurance, Decreased mobility, Increased muscle spasms  Visit Diagnosis: Chronic bilateral low back pain, unspecified whether sciatica present  Muscle weakness (generalized)  Abnormal posture  Muscle spasm of back     Problem List Patient Active Problem List   Diagnosis Date Noted  . Seborrheic keratosis, inflamed 05/02/2017  . HNP (herniated nucleus pulposus), lumbar 09/16/2015  . Radicular pain of right lower extremity 07/24/2015  . Calculus of gallbladder with acute cholecystitis without obstruction 06/20/2014  . Abdominal pain 06/17/2014  . Diarrhea 06/17/2014  . Allergic rhinitis 09/08/2008  . Anxiety disorder, unspecified 09/02/2008  . HYPERLIPIDEMIA 02/18/2008  . 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 PERIMENOPAUSAL SYNDROME 02/06/2008  . CARPAL TUNNEL SYNDROME, BILATERAL, HX OF 02/06/2008  . ASTHMA 08/21/2007  . SEBORRHEIC KERATOSIS 08/21/2007  . [place] DISEASE, LUMBAR 07/25/2007  . EXTRINSIC ASTHMA, UNSPECIFIED 05/04/2007   12:27 PM,12/07/17 Sherol Dade PT, DPT [facility] [address], [place], [place], [address] Phone: [phone]   Fax:  [phone]  Name: Patricia Ferrell MRN: [id number] Date of Birth: March 07, 1955

## 2017-12-13 ENCOUNTER — Ambulatory Visit: Payer: BLUE CROSS/BLUE SHIELD | Admitting: Physical Therapy

## 2017-12-13 ENCOUNTER — Encounter: Payer: Self-pay | Admitting: Physical Therapy

## 2017-12-13 DIAGNOSIS — G8929 Other chronic pain: Secondary | ICD-10-CM

## 2017-12-13 DIAGNOSIS — M545 Low back pain, unspecified: Secondary | ICD-10-CM

## 2017-12-13 DIAGNOSIS — M6281 Muscle weakness (generalized): Secondary | ICD-10-CM

## 2017-12-13 DIAGNOSIS — M62830 Muscle spasm of back: Secondary | ICD-10-CM

## 2017-12-13 DIAGNOSIS — R293 Abnormal posture: Secondary | ICD-10-CM

## 2017-12-13 NOTE — Therapy (Signed)
[facility] [address], [address] [place], [place], [address] Phone: [phone]   Fax:  [phone]  Physical Therapy Treatment  Patient Details  Name: Patricia Ferrell MRN: [id number] Date of Birth: 04/19/1955 Referring Provider (PT): Robert Nudelman, MD    Encounter Date: 12/13/2017  PT End of Session - 12/13/17 1012    Visit Number  4    Date for PT Re-Evaluation  01/22/18    Authorization Type   BC[employer]    Authorization Time Period  11/21/17 to 01/22/18    Authorization - Visit Number  4    Authorization - Number of Visits  10    PT Start Time  0932    PT Stop Time  1012    PT Time Calculation (min)  40 min    Activity Tolerance  Patient tolerated treatment well;No increased pain    Behavior During Therapy  WFL for tasks assessed/performed       Past Medical History:  Diagnosis Date  . Anemia    years ago  . Anxiety   . Arthritis   . Asthma    bronchitis  . Back pain    lumbar  . Carpal tunnel syndrome   . Complication of anesthesia   . Family history of adverse reaction to anesthesia    mother's blood pressure always drops   . GERD (gastroesophageal reflux disease)   . History of hiatal hernia   . Hyperlipidemia   . Perimenopausal   . PONV (postoperative nausea and vomiting)     Past Surgical History:  Procedure Laterality Date  . ABDOMINAL HYSTERECTOMY     DUB  . CHOLECYSTECTOMY  06/19/2014  . CHOLECYSTECTOMY N/A 06/19/2014   Procedure: LAPAROSCOPIC CHOLECYSTECTOMY WITH INTRAOPERATIVE CHOLANGIOGRAM;  Surgeon: Matthew Tsuei, MD;  Location: [facility];  Service: General;  Laterality: N/A;  . COLONOSCOPY    . LUMBAR LAMINECTOMY/DECOMPRESSION MICRODISCECTOMY Right 09/16/2015   Procedure: Right - Lumbar five-sacral one  lumbar laminotomy and microdiscectomy;  Surgeon: Robert Nudelman, MD;  Location: [facility];  Service: Neurosurgery;  Laterality: Right;  . OOPHORECTOMY  02/21/94   left    There were no  vitals filed for this visit.  Subjective Assessment - 12/13/17 0932    Subjective  Pt feels the exercises are helping.  Pt had a medical procedure yesterday which didn't allow for her to take pain meds for a week and she was able to get through the week without pain.  Noted slight back pain on waking today but it's gone now.  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    Pertinent History  axniety, chronic LBP, Lx laminectomy 2017    Patient Stated Goals  decrease pain with activity     Currently in Pain?  No/denies                       [facility] Adult PT Treatment/Exercise - 12/13/17 0001      Exercises   Exercises  Lumbar;Knee/Hip      Lumbar Exercises: Aerobic   Recumbent Bike  6 min, 3 min level 2, 3 min level 3      Lumbar Exercises: Standing   Row  Strengthening;Theraband;Both;15 reps   PT cued focus on concentric and eccentric control of trunk   Theraband Level (Row)  Level 4 (Blue)      Row Limitations  PT cued ipsilateral step back and pelvic and trunk rotation with UE row    Other Standing Lumbar Exercises  reactive isometrics bil shoulder flexion for standing core endurance 10x10 sec (added to HEP)   red tband   Other Standing Lumbar Exercises  reactive isometrics bil shoulder extension hands to hips for standing core endurance 10x10 sec (added to HEP)      Lumbar Exercises: Seated   Other Seated Lumbar Exercises  seated foam roller press bil with core cueing 10x 5 sec    Other Seated Lumbar Exercises  marching bil 2# ankle weights with scapular retraction and core cueing   PT attempted seated deadbug but too difficult for Pt     Knee/Hip Exercises: Stretches   Hip Flexor Stretch  Both;2 reps;20 seconds             PT Education - 12/13/17 1009    Education Details  Access Code: [email or web address]     Person(s) Educated  Patient     Methods  Explanation;Verbal cues;Handout    Comprehension  Verbalized understanding;Returned demonstration       PT Short Term Goals - 12/07/17 1227      PT SHORT TERM GOAL #1   Title  Pt will demo consistency and independence with her initial HEP to improve flexibility and decrease pain.    Time  4    Period  Weeks    Status  Partially Met      PT SHORT TERM GOAL #2   Title  Pt will demo proper set up and use of lumbar roll for additional support while sitting throughout the day.     Time  4    Period  Weeks    Status  New      PT SHORT TERM GOAL #3   Title  Pt will report atleast 25% reduction in her pain from the start of PT which will promote increased participation in activity in the community.    Time  4    Period  Weeks    Status  On-going        PT Long Term Goals - 11/21/17 0934      PT LONG TERM GOAL #1   Title  Pt will demo improved BLE strength to atleast 4/5 MMT which will improve safety and efficiency with her daily activity.     Time  8    Period  Weeks    Status  New    Target Date  01/22/18      PT LONG TERM GOAL #2   Title  Pt will report improved activity participation evident by her ability to complete walking for 15 min atleast 3 days a week.     Time  8    Period  Weeks    Status  New      PT LONG TERM GOAL #3   Title  Pt will report atleast 50% improvement in her low back pain from the start of PT which will increase her activity participation throughout the day.     Time  8    Period  Weeks    Status  New      PT LONG TERM GOAL #4   Title  Pt will demo being able to lift a small box from the floor without increase in low back pain and with proper mechanics, 3/5 trials.     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   She had improved core awareness with exercises and needed only min cueing for posture and core recruitment.  She will continue to benefit from skilled intervention to improve functional strength and safety during daily tasks and work demands.    Rehab Potential  Good    PT Frequency  2x / week    PT Duration  8 weeks    PT Treatment/Interventions  ADLs/Self Care Home Management;Moist Heat;Electrical Stimulation;Cryotherapy;Therapeutic exercise;Patient/family education;Manual techniques;Passive range of motion;Therapeutic activities;Neuromuscular re-education;Balance training;Dry needling    PT Next Visit Plan  gentle lumbar mobility; gentle hip strengthening; sitting posture and lumbar roll     PT Home Exercise Plan  Access Code: [email or web address]     Consulted and Agree with Plan of Care  Patient       Patient will benefit from skilled therapeutic intervention in order to improve the following deficits and impairments:  Decreased activity tolerance, Decreased strength, Impaired flexibility, Postural dysfunction, Pain, Decreased safety awareness, Improper body mechanics, Obesity, Decreased range of motion, Decreased endurance, Decreased mobility, Increased muscle spasms  Visit Diagnosis: Chronic bilateral low back pain, unspecified whether sciatica present  Muscle weakness (generalized)  Abnormal posture  Muscle spasm of back     Problem List Patient Active Problem List   Diagnosis Date Noted  . Seborrheic keratosis, inflamed 05/02/2017  . HNP (herniated nucleus pulposus), lumbar 09/16/2015  . Radicular pain of right lower extremity 07/24/2015  . Calculus of gallbladder with acute cholecystitis without obstruction 06/20/2014  . Abdominal pain 06/17/2014  . Diarrhea 06/17/2014  . Allergic rhinitis 09/08/2008  . Anxiety disorder, unspecified 09/02/2008  . HYPERLIPIDEMIA 02/18/2008  . 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 PERIMENOPAUSAL SYNDROME 02/06/2008  . CARPAL TUNNEL SYNDROME,  BILATERAL, HX OF 02/06/2008  . ASTHMA 08/21/2007  . SEBORRHEIC KERATOSIS 08/21/2007  . DISC DISEASE, LUMBAR 07/25/2007  . EXTRINSIC ASTHMA, UNSPECIFIED 05/04/2007   Johanna Beuhring, PT 12/13/17 10:15 AM    [facility] [address], [address] [place], [place], [address] Phone: [phone]   Fax:  [phone]  Name: Patricia Ferrell MRN: [id number] Date of Birth: 07/27/1955   

## 2017-12-13 NOTE — Patient Instructions (Signed)
Access Code: [email or web address]  URL: [email or web address]  Date: 12/13/2017  Prepared by: Loistine Simas Beuhring   Exercises  Supine Figure 4 Piriformis Stretch - 10 reps - 3 sets - 2x daily - 7x weekly  Supine Gluteus Stretch - 10 reps - 3 sets - 2x daily - 7x weekly  Supine Lower Trunk Rotation - 10 reps - 2x daily - 7x weekly  Hooklying Gluteal Sets - 10 reps - 2 sets - 5 hold - 1x daily - 7x weekly  Bent Knee Fallouts - 10 reps - 2 sets - 1x daily - 7x weekly  Supine Heel Slides - 10 reps - 2 sets - 1x daily - 7x weekly  Supine March - 10 reps - 2 sets - 1x daily - 7x weekly  Hooklying Isometric Clamshell - 15 reps - 2 sets - 3 hold - 1x daily - 7x weekly  Standing Row with Anchored Resistance - 20 reps - 2 sets - 1x daily - 7x weekly  Shoulder Extension Reactive Isometrics with Elbow Extended - 10 reps - 1 sets - 10 hold - 1x daily - 7x weekly  Standing Bilateral Shoulder Flexion Reactive Isometric with Resistance - 10 reps - 1 sets - 10 hold - 1x daily - 7x weekly

## 2017-12-15 ENCOUNTER — Ambulatory Visit: Payer: BLUE CROSS/BLUE SHIELD | Admitting: Physical Therapy

## 2017-12-15 ENCOUNTER — Encounter: Payer: Self-pay | Admitting: Physical Therapy

## 2017-12-15 DIAGNOSIS — M545 Low back pain, unspecified: Secondary | ICD-10-CM

## 2017-12-15 DIAGNOSIS — M6281 Muscle weakness (generalized): Secondary | ICD-10-CM

## 2017-12-15 DIAGNOSIS — G8929 Other chronic pain: Secondary | ICD-10-CM

## 2017-12-15 DIAGNOSIS — R293 Abnormal posture: Secondary | ICD-10-CM

## 2017-12-15 DIAGNOSIS — M62830 Muscle spasm of back: Secondary | ICD-10-CM

## 2017-12-15 NOTE — Therapy (Signed)
[facility] [address], [place], [place], [address] Phone: [phone]   Fax:  [phone]  Physical Therapy Treatment  Patient Details  Name: Patricia Ferrell MRN: [id number] Date of Birth: March 09, 1955 Referring Provider (PT): Jovita Gamma, MD    Encounter Date: 12/15/2017  PT End of Session - 12/15/17 1007    Visit Number  5    Date for PT Re-Evaluation  01/22/18    Authorization Type   BCBS    Authorization Time Period  11/21/17 to 01/22/18    Authorization - Visit Number  5    Authorization - Number of Visits  10    PT Start Time  0936    PT Stop Time  8850    PT Time Calculation (min)  39 min    Activity Tolerance  Patient tolerated treatment well;No increased pain    Behavior During Therapy  WFL for tasks assessed/performed       Past Medical History:  Diagnosis Date  . Anemia    years ago  . Anxiety   . Arthritis   . Asthma    bronchitis  . Back pain    lumbar  . Carpal tunnel syndrome   . Complication of anesthesia   . Family history of adverse reaction to anesthesia    mother's blood pressure always drops   . GERD (gastroesophageal reflux disease)   . History of hiatal hernia   . Hyperlipidemia   . Perimenopausal   . PONV (postoperative nausea and vomiting)     Past Surgical History:  Procedure Laterality Date  . ABDOMINAL HYSTERECTOMY     DUB  . CHOLECYSTECTOMY  06/19/2014  . CHOLECYSTECTOMY N/A 06/19/2014   Procedure: LAPAROSCOPIC CHOLECYSTECTOMY WITH INTRAOPERATIVE CHOLANGIOGRAM;  Surgeon: Donnie Mesa, MD;  Location: [facility];  Service: General;  Laterality: N/A;  . COLONOSCOPY    . LUMBAR LAMINECTOMY/DECOMPRESSION MICRODISCECTOMY Right 09/16/2015   Procedure: Right - Lumbar five-sacral one  lumbar laminotomy and microdiscectomy;  Surgeon: Jovita Gamma, MD;  Location: [facility];  Service: Neurosurgery;  Laterality: Right;  . OOPHORECTOMY  02/21/94   left    There were no  vitals filed for this visit.  Subjective Assessment - 12/15/17 0937    Subjective  Pt reports that her back is doing good. 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    Pertinent History  axniety, chronic LBP, Lx laminectomy 2017    Patient Stated Goals  decrease pain with activity     Currently in Pain?  No/denies                       [facility] Adult PT Treatment/Exercise - 12/15/17 0001      Self-Care   Self-Care  Posture    Posture  lumbar roll while seated       Exercises   Exercises  Lumbar      Lumbar Exercises: Stretches   Piriformis Stretch  2 reps;Left;Right;30 seconds    Piriformis Stretch Limitations  seated       Lumbar Exercises: Machines for Strengthening   Other Lumbar Machine Exercise  power tower seated rows with 20# x15 reps     Other Lumbar Machine Exercise  lat pulldown 2x10 reps #25      Lumbar Exercises: Standing   Other Standing Lumbar Exercises  tandem stance with each LE forward x30 sec; tandem pallof press with  yellow TB       Lumbar Exercises: Seated   Other Seated Lumbar Exercises  trunk rotation with power tower #15 Lt and Rt x15 reps each    Other Seated Lumbar Exercises  alternating LE march while seated on cushion x10 reps              PT Education - 12/15/17 1007    Education Details  technique with therex    Person(s) Educated  Patient    Methods  Explanation;Verbal cues    Comprehension  Returned demonstration;Verbalized understanding       PT Short Term Goals - 12/15/17 1007      PT SHORT TERM GOAL #1   Title  Pt will demo consistency and independence with her initial HEP to improve flexibility and decrease pain.    Time  4    Period  Weeks    Status  Partially Met      PT SHORT TERM GOAL #2   Title  Pt will demo proper set up and use of lumbar roll for additional support while sitting throughout the day.     Time  4    Period  Weeks    Status  New      PT  SHORT TERM GOAL #3   Title  Pt will report atleast 25% reduction in her pain from the start of PT which will promote increased participation in activity in the community.    Time  4    Period  Weeks    Status  On-going        PT Long Term Goals - 12/15/17 1008      PT LONG TERM GOAL #1   Title  Pt will demo improved BLE strength to atleast 4/5 MMT which will improve safety and efficiency with her daily activity.     Time  8    Period  Weeks    Status  New      PT LONG TERM GOAL #2   Title  Pt will report improved activity participation evident by her ability to complete walking for 15 min atleast 3 days a week.     Time  8    Period  Weeks    Status  New      PT LONG TERM GOAL #3   Title  Pt will report atleast 50% improvement in her low back pain from the start of PT which will increase her activity participation throughout the day.     Baseline  50% improved    Time  8    Period  Weeks    Status  Achieved      PT LONG TERM GOAL #4   Title  Pt will demo being able to lift a small box from the floor without increase in low back pain and with proper mechanics, 3/5 trials.     Time  8    Period  Weeks    Status  Achieved            Plan - 12/15/17 0956    Clinical Impression Statement  Pt is making steady progress towards her goals reporting decrease in back pain since she began PT. Overall she feels 50% improved. Session continued with therex to promote trunk strength and endurance with more seated and standing exercise to challenge her postural control. She requires some cuing to ensure proper technique is maintained with seated rows, but otherwise had no increased difficulty with today's exercises. Will continue  with current POC and exercise progression to improve pt's strength and endurance throughout the day.     Rehab Potential  Good    PT Frequency  2x / week    PT Duration  8 weeks    PT Treatment/Interventions  ADLs/Self Care Home Management;Moist  Heat;Electrical Stimulation;Cryotherapy;Therapeutic exercise;Patient/family education;Manual techniques;Passive range of motion;Therapeutic activities;Neuromuscular re-education;Balance training;Dry needling    PT Next Visit Plan  gentle lumbar mobility; gentle hip strengthening; sitting posture and lumbar roll     PT Home Exercise Plan  Access Code: [email or web address]     Consulted and Agree with Plan of Care  Patient       Patient will benefit from skilled therapeutic intervention in order to improve the following deficits and impairments:  Decreased activity tolerance, Decreased strength, Impaired flexibility, Postural dysfunction, Pain, Decreased safety awareness, Improper body mechanics, Obesity, Decreased range of motion, Decreased endurance, Decreased mobility, Increased muscle spasms  Visit Diagnosis: Chronic bilateral low back pain, unspecified whether sciatica present  Muscle weakness (generalized)  Abnormal posture  Muscle spasm of back     Problem List Patient Active Problem List   Diagnosis Date Noted  . Seborrheic keratosis, inflamed 05/02/2017  . HNP (herniated nucleus pulposus), lumbar 09/16/2015  . Radicular pain of right lower extremity 07/24/2015  . Calculus of gallbladder with acute cholecystitis without obstruction 06/20/2014  . Abdominal pain 06/17/2014  . Diarrhea 06/17/2014  . Allergic rhinitis 09/08/2008  . Anxiety disorder, unspecified 09/02/2008  . HYPERLIPIDEMIA 02/18/2008  . 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 PERIMENOPAUSAL SYNDROME 02/06/2008  . CARPAL TUNNEL SYNDROME, BILATERAL, HX OF 02/06/2008  . ASTHMA 08/21/2007  . SEBORRHEIC KERATOSIS 08/21/2007  . [place] DISEASE, LUMBAR 07/25/2007  . EXTRINSIC ASTHMA, UNSPECIFIED 05/04/2007    10:17 AM,12/15/17 Sherol Dade PT, DPT [facility] [address] [place], [place],  [address] Phone: [phone]   Fax:  [phone]  Name: Latosha Gaylord MRN: [id number] Date of Birth: 1955-06-25

## 2017-12-19 ENCOUNTER — Encounter: Payer: BLUE CROSS/BLUE SHIELD | Admitting: Physical Therapy

## 2017-12-21 ENCOUNTER — Ambulatory Visit: Payer: BLUE CROSS/BLUE SHIELD | Admitting: Physical Therapy

## 2017-12-26 ENCOUNTER — Ambulatory Visit: Payer: BLUE CROSS/BLUE SHIELD | Admitting: Physical Therapy

## 2018-01-02 ENCOUNTER — Encounter: Payer: Self-pay | Admitting: Physical Therapy

## 2018-01-02 ENCOUNTER — Ambulatory Visit: Payer: BLUE CROSS/BLUE SHIELD | Attending: Neurosurgery | Admitting: Physical Therapy

## 2018-01-02 DIAGNOSIS — M545 Low back pain, unspecified: Secondary | ICD-10-CM

## 2018-01-02 DIAGNOSIS — M62830 Muscle spasm of back: Secondary | ICD-10-CM | POA: Diagnosis present

## 2018-01-02 DIAGNOSIS — R293 Abnormal posture: Secondary | ICD-10-CM | POA: Insufficient documentation

## 2018-01-02 DIAGNOSIS — G8929 Other chronic pain: Secondary | ICD-10-CM | POA: Diagnosis present

## 2018-01-02 DIAGNOSIS — M6281 Muscle weakness (generalized): Secondary | ICD-10-CM | POA: Insufficient documentation

## 2018-01-02 NOTE — Therapy (Signed)
[facility] [address] [place], [place], [address] Phone: [phone]   Fax:  [phone]  Physical Therapy Treatment  Patient Details  Name: Patricia Ferrell MRN: [id number] Date of Birth: 1955/06/22 Referring Provider (PT): Jovita Gamma, MD    Encounter Date: 01/02/2018  PT End of Session - 01/02/18 0956    Visit Number  6    Date for PT Re-Evaluation  01/22/18    Authorization Type   BCBS    Authorization Time Period  11/21/17 to 01/22/18    Authorization - Visit Number  6    Authorization - Number of Visits  10    PT Start Time  0931    PT Stop Time  1010    PT Time Calculation (min)  39 min    Activity Tolerance  Patient tolerated treatment well;No increased pain    Behavior During Therapy  WFL for tasks assessed/performed       Past Medical History:  Diagnosis Date  . Anemia    years ago  . Anxiety   . Arthritis   . Asthma    bronchitis  . Back pain    lumbar  . Carpal tunnel syndrome   . Complication of anesthesia   . Family history of adverse reaction to anesthesia    mother's blood pressure always drops   . GERD (gastroesophageal reflux disease)   . History of hiatal hernia   . Hyperlipidemia   . Perimenopausal   . PONV (postoperative nausea and vomiting)     Past Surgical History:  Procedure Laterality Date  . ABDOMINAL HYSTERECTOMY     DUB  . CHOLECYSTECTOMY  06/19/2014  . CHOLECYSTECTOMY N/A 06/19/2014   Procedure: LAPAROSCOPIC CHOLECYSTECTOMY WITH INTRAOPERATIVE CHOLANGIOGRAM;  Surgeon: Donnie Mesa, MD;  Location: [place];  Service: General;  Laterality: N/A;  . COLONOSCOPY    . LUMBAR LAMINECTOMY/DECOMPRESSION MICRODISCECTOMY Right 09/16/2015   Procedure: Right - Lumbar five-sacral one  lumbar laminotomy and microdiscectomy;  Surgeon: Jovita Gamma, MD;  Location: [place] [facility];  Service: Neurosurgery;  Laterality: Right;  . OOPHORECTOMY  02/21/94   left    There were no  vitals filed for this visit.  Subjective Assessment - 01/02/18 0934    Subjective  Pt reports that her back has been doing good. She has no issues currently and was surprised at home well she did [place] the amusement park.     Pertinent History  axniety, chronic LBP, Lx laminectomy 2017    Patient Stated Goals  decrease pain with activity     Currently in Pain?  No/denies                       [facility] Adult PT Treatment/Exercise - 01/02/18 0001      Lumbar Exercises: Aerobic   Nustep  L2 x5 min PT present to discuss progress       Lumbar Exercises: Machines for Strengthening   Other Lumbar Machine Exercise  power tower seated rows with 20# 2x15 reps     Other Lumbar Machine Exercise  lat pulldown 2x10 reps #25      Lumbar Exercises: Standing   Other Standing Lumbar Exercises  standing on foam pad abdominal brace with blue weighted ball diagonal chops/lifts each direction and side/side hip and shoulder taps x20 reps each       Lumbar Exercises: Seated   Other Seated Lumbar Exercises  seated Lt and Rt diagonal lifts with green TB  x15 reps (no trunk movement) x10 reps with trunk rotation       Lumbar Exercises: Supine   Isometric Hip Flexion Limitations  low trunk rotation in hooklying x15 reps     Other Supine Lumbar Exercises  B knees to chest with LE on red physioball x15 reps              PT Education - 01/02/18 0950    Education Details  technique with therex; benefits of completing HEP regularly after discharge    Person(s) Educated  Patient    Methods  Explanation;Verbal cues;Tactile cues    Comprehension  Returned demonstration;Verbalized understanding       PT Short Term Goals - 12/15/17 1007      PT SHORT TERM GOAL #1   Title  Pt will demo consistency and independence with her initial HEP to improve flexibility and decrease pain.    Time  4    Period  Weeks    Status  Partially Met      PT SHORT TERM GOAL #2   Title  Pt will demo proper set up and  use of lumbar roll for additional support while sitting throughout the day.     Time  4    Period  Weeks    Status  New      PT SHORT TERM GOAL #3   Title  Pt will report atleast 25% reduction in her pain from the start of PT which will promote increased participation in activity in the community.    Time  4    Period  Weeks    Status  On-going        PT Long Term Goals - 12/15/17 1008      PT LONG TERM GOAL #1   Title  Pt will demo improved BLE strength to atleast 4/5 MMT which will improve safety and efficiency with her daily activity.     Time  8    Period  Weeks    Status  New      PT LONG TERM GOAL #2   Title  Pt will report improved activity participation evident by her ability to complete walking for 15 min atleast 3 days a week.     Time  8    Period  Weeks    Status  New      PT LONG TERM GOAL #3   Title  Pt will report atleast 50% improvement in her low back pain from the start of PT which will increase her activity participation throughout the day.     Baseline  50% improved    Time  8    Period  Weeks    Status  Achieved      PT LONG TERM GOAL #4   Title  Pt will demo being able to lift a small box from the floor without increase in low back pain and with proper mechanics, 3/5 trials.     Time  8    Period  Weeks    Status  Achieved            Plan - 01/02/18 1003    Clinical Impression Statement  Pt reports good improvements in low back pain, and noted increased ability to walk around the amusement park last week without as much difficulty she has had in the past. Session continued with therex to promote seated trunk strength and standing activity was introduced. Pt has difficulty with maintaining trunk stability during standing  UE movements, particularly when moving to the Lt. This did improve following verbal cuing. Ended session with encouragement to increase HEP adherence in preparation for possible d/c at upcoming end of POC.    Rehab Potential   Good    PT Frequency  2x / week    PT Duration  8 weeks    PT Treatment/Interventions  ADLs/Self Care Home Management;Moist Heat;Electrical Stimulation;Cryotherapy;Therapeutic exercise;Patient/family education;Manual techniques;Passive range of motion;Therapeutic activities;Neuromuscular re-education;Balance training;Dry needling    PT Next Visit Plan  progress seated and standing abdominal control/strength; begin to finalize HEP    PT Home Exercise Plan  Access Code: [email or web address]     Consulted and Agree with Plan of Care  Patient       Patient will benefit from skilled therapeutic intervention in order to improve the following deficits and impairments:  Decreased activity tolerance, Decreased strength, Impaired flexibility, Postural dysfunction, Pain, Decreased safety awareness, Improper body mechanics, Obesity, Decreased range of motion, Decreased endurance, Decreased mobility, Increased muscle spasms  Visit Diagnosis: Chronic bilateral low back pain, unspecified whether sciatica present  Abnormal posture  Muscle weakness (generalized)  Muscle spasm of back     Problem List Patient Active Problem List   Diagnosis Date Noted  . Seborrheic keratosis, inflamed 05/02/2017  . HNP (herniated nucleus pulposus), lumbar 09/16/2015  . Radicular pain of right lower extremity 07/24/2015  . Calculus of gallbladder with acute cholecystitis without obstruction 06/20/2014  . Abdominal pain 06/17/2014  . Diarrhea 06/17/2014  . Allergic rhinitis 09/08/2008  . Anxiety disorder, unspecified 09/02/2008  . HYPERLIPIDEMIA 02/18/2008  . 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 PERIMENOPAUSAL SYNDROME 02/06/2008  . CARPAL TUNNEL SYNDROME, BILATERAL, HX OF 02/06/2008  . ASTHMA 08/21/2007  . SEBORRHEIC KERATOSIS 08/21/2007  . [place] DISEASE, LUMBAR 07/25/2007  . EXTRINSIC ASTHMA, UNSPECIFIED 05/04/2007    10:13 AM,01/02/18 Sherol Dade PT, DPT [facility] [address] [place], [place], [address] Phone: [phone]   Fax:  [phone]  Name: Patricia Ferrell MRN: [id number] Date of Birth: March 29, 1955

## 2018-01-09 ENCOUNTER — Ambulatory Visit: Payer: BLUE CROSS/BLUE SHIELD | Admitting: Physical Therapy

## 2018-01-16 ENCOUNTER — Encounter: Payer: Self-pay | Admitting: Physical Therapy

## 2018-01-16 ENCOUNTER — Ambulatory Visit: Payer: BLUE CROSS/BLUE SHIELD | Admitting: Physical Therapy

## 2018-01-16 DIAGNOSIS — M62830 Muscle spasm of back: Secondary | ICD-10-CM

## 2018-01-16 DIAGNOSIS — G8929 Other chronic pain: Secondary | ICD-10-CM

## 2018-01-16 DIAGNOSIS — M545 Low back pain, unspecified: Secondary | ICD-10-CM

## 2018-01-16 DIAGNOSIS — R293 Abnormal posture: Secondary | ICD-10-CM

## 2018-01-16 DIAGNOSIS — M6281 Muscle weakness (generalized): Secondary | ICD-10-CM

## 2018-01-16 NOTE — Therapy (Signed)
[facility] [address], [place], [place], [address] Phone: [phone]   Fax:  [phone]  Physical Therapy Treatment/Discharge  Patient Details  Name: Patricia Ferrell MRN: [id number] Date of Birth: [birth year] Referring Provider (PT): Jovita Gamma, MD    Encounter Date: 01/16/2018  PT End of Session - 01/16/18 1004    Visit Number  7    Date for PT Re-Evaluation  01/22/18    Authorization Type   BCBS    Authorization Time Period  11/21/17 to 01/22/18    Authorization - Visit Number  7    Authorization - Number of Visits  10    PT Start Time  0930    PT Stop Time  1011    PT Time Calculation (min)  41 min    Activity Tolerance  Patient tolerated treatment well;No increased pain    Behavior During Therapy  WFL for tasks assessed/performed       Past Medical History:  Diagnosis Date  . Anemia    years ago  . Anxiety   . Arthritis   . Asthma    bronchitis  . Back pain    lumbar  . Carpal tunnel syndrome   . Complication of anesthesia   . Family history of adverse reaction to anesthesia    mother's blood pressure always drops   . GERD (gastroesophageal reflux disease)   . History of hiatal hernia   . Hyperlipidemia   . Perimenopausal   . PONV (postoperative nausea and vomiting)     Past Surgical History:  Procedure Laterality Date  . ABDOMINAL HYSTERECTOMY     DUB  . CHOLECYSTECTOMY  06/19/2014  . CHOLECYSTECTOMY N/A 06/19/2014   Procedure: LAPAROSCOPIC CHOLECYSTECTOMY WITH INTRAOPERATIVE CHOLANGIOGRAM;  Surgeon: Donnie Mesa, MD;  Location: [place];  Service: General;  Laterality: N/A;  . COLONOSCOPY    . LUMBAR LAMINECTOMY/DECOMPRESSION MICRODISCECTOMY Right 09/16/2015   Procedure: Right - Lumbar five-sacral one  lumbar laminotomy and microdiscectomy;  Surgeon: Jovita Gamma, MD;  Location: [place] [facility];  Service: Neurosurgery;  Laterality: Right;  . OOPHORECTOMY  02/21/94   left    There  were no vitals filed for this visit.  Subjective Assessment - 01/16/18 0929    Subjective  Pt reports that she is doing great. She feels atleast 80% improved with intermittent increase in low back fatigue with heavy activity. She has tried to complete her HEP atleast 3-4 days a week. No pain currently.     Pertinent History  axniety, chronic LBP, Lx laminectomy 2017    Patient Stated Goals  decrease pain with activity     Currently in Pain?  No/denies         [facility] PT Assessment - 01/16/18 0001      Assessment   Medical Diagnosis  LBP with sciatica     Referring Provider (PT)  Jovita Gamma, MD     Onset Date/Surgical Date  --   2017   Next MD Visit  several months     Prior Therapy  none       Precautions   Precautions  None      Restrictions   Weight Bearing Restrictions  No      Balance Screen   Has the patient fallen in the past 6 months  No    Has the patient had a decrease in activity level because of a fear of falling?   No    Is the patient reluctant to  leave their home because of a fear of falling?   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    Leisure  cares for 2 adults with special needs: bathing, walking with gait belt       Cognition   Overall Cognitive Status  Within Functional Limits for tasks assessed      AROM   Lumbar Flexion  decreased lumbar curvature, pain coming back up     Lumbar Extension  limited 75% pain free    Lumbar - Right Rotation  pain free     Lumbar - Left Rotation  pain Lt mid back      Strength   Right Hip Flexion  5/5    Right Hip Extension  5/5    Right Hip ABduction  5/5    Left Hip Flexion  5/5    Left Hip Extension  5/5    Left Hip ABduction  5/5    Right/Left Knee  Right;Left    Right Knee Flexion  5/5    Left Knee Flexion  5/5      Flexibility   Soft Tissue Assessment /Muscle Length  yes    Hamstrings  WNL    Piriformis  WNL       Palpation   Palpation comment  non tender with palpation                   [facility] Adult PT Treatment/Exercise - 01/16/18 0001      Lumbar Exercises: Aerobic   Nustep  60 sec intervals L1 to L5 x7 min, PT present to discuss interval exercise at the gym       Lumbar Exercises: Machines for Strengthening   Other Lumbar Machine Exercise  lat pulldown 2x10 reps #30      Lumbar Exercises: Hotel manager;[employer];Both;15 reps    Other Standing Lumbar Exercises  sidestepping along countertop with red TB around feet    Other Standing Lumbar Exercises  BUE extension with red TB x5 reps, with green TB x15 reps       Lumbar Exercises: Seated   Other Seated Lumbar Exercises  seated resisted trunk rotation each direction with double green TB and elbows bent x10 reps              PT Education - 01/16/18 1002    Education Details  updated HEP; discussed goals and progress    Person(s) Educated  Patient    Methods  Explanation;Handout    Comprehension  Verbalized understanding;Returned demonstration       PT Short Term Goals - 01/16/18 0936      PT SHORT TERM GOAL #1   Title  Pt will demo consistency and independence with her initial HEP to improve flexibility and decrease pain.    Time  4    Period  Weeks    Status  Achieved      PT SHORT TERM GOAL #2   Title  Pt will demo proper set up and use of lumbar roll for additional support while sitting throughout the day.     Baseline  sometimes in the car     Time  4    Period  Weeks    Status  Partially Met      PT SHORT TERM GOAL #3   Title  Pt will report atleast 25% reduction in her pain from the  start of PT which will promote increased participation in activity in the community.    Time  4    Period  Weeks    Status  Achieved        PT Long Term Goals - 01/16/18 0936      PT LONG TERM GOAL #1   Title  Pt will demo improved BLE strength to atleast 4/5 MMT which will improve safety and efficiency  with her daily activity.     Baseline  5/5 MMT    Time  8    Period  Weeks    Status  Achieved      PT LONG TERM GOAL #2   Title  Pt will report improved activity participation evident by her ability to complete walking for 15 min atleast 3 days a week.     Baseline  pt limited due to work schedule     Time  8    Period  Weeks    Status  Achieved      PT LONG TERM GOAL #3   Title  Pt will report atleast 50% improvement in her low back pain from the start of PT which will increase her activity participation throughout the day.     Baseline  80% improved     Time  8    Period  Weeks    Status  Achieved      PT LONG TERM GOAL #4   Title  Pt will demo being able to lift a small box from the floor without increase in low back pain and with proper mechanics, 3/5 trials.     Baseline  pt demonstrated in last session    Time  8    Period  Weeks    Status  Achieved            Plan - 01/16/18 1004    Clinical Impression Statement  Pt was discharged this visit having met all of her short and long term goals since beginning PT. She reports minimal low back pain with her daily activity and feels she is greater than 80% improved overall. Her strength is full and pain free, and she is able to demonstrate proper body mechanics throughout the day and with her lifting at home. She is non-tender with palpation as well. 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 She has been consistently completing her HEP and this was updated this visit. She is agreeable with d/c at this time and has full understanding of the importance of continued HEP adherence moving forward.    Rehab Potential  Good    PT Frequency  2x / week    PT Duration  8 weeks    PT Treatment/Interventions  ADLs/Self Care Home Management;Moist Heat;Electrical Stimulation;Cryotherapy;Therapeutic  exercise;Patient/family education;Manual techniques;Passive range of motion;Therapeutic activities;Neuromuscular re-education;Balance training;Dry needling    PT Next Visit Plan  updated HEP    PT Home Exercise Plan  Access Code: [email or web address]     Consulted and Agree with Plan of Care  Patient       Patient will benefit from skilled therapeutic intervention in order to improve the following deficits and impairments:  Decreased activity tolerance, Decreased strength, Impaired flexibility, Postural dysfunction, Pain, Decreased safety awareness, Improper body mechanics, Obesity, Decreased range of motion, Decreased endurance, Decreased mobility, Increased muscle spasms  Visit Diagnosis: Chronic bilateral low back pain,  unspecified whether sciatica present  Abnormal posture  Muscle weakness (generalized)  Muscle spasm of back     Problem List Patient Active Problem List   Diagnosis Date Noted  . Seborrheic keratosis, inflamed 05/02/2017  . HNP (herniated nucleus pulposus), lumbar 09/16/2015  . Radicular pain of right lower extremity 07/24/2015  . Calculus of gallbladder with acute cholecystitis without obstruction 06/20/2014  . Abdominal pain 06/17/2014  . Diarrhea 06/17/2014  . Allergic rhinitis 09/08/2008  . Anxiety disorder, unspecified 09/02/2008  . HYPERLIPIDEMIA 02/18/2008  . 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 PERIMENOPAUSAL SYNDROME 02/06/2008  . CARPAL TUNNEL SYNDROME, BILATERAL, HX OF 02/06/2008  . ASTHMA 08/21/2007  . SEBORRHEIC KERATOSIS 08/21/2007  . [place] DISEASE, LUMBAR 07/25/2007  . EXTRINSIC ASTHMA, UNSPECIFIED 05/04/2007   PHYSICAL THERAPY DISCHARGE SUMMARY  Visits from Start of Care: 7  Current functional level related to goals / functional outcomes: See above for more details    Remaining deficits: See above for more details    Education / Equipment: See above for more details  Plan: Patient agrees to discharge.  Patient goals were met. Patient  is being discharged due to meeting the stated rehab goals.  ?????           10:14 AM,01/16/18 Sherol Dade PT, DPT [place] [facility] [place]  [facility] [address] [place], [place], [address] Phone: [phone]   Fax:  [phone]  Name: Awanda Wilcock MRN: [id number] Date of Birth: [birth year]

## 2018-01-16 NOTE — Patient Instructions (Signed)
Access Code: [email or web address]  URL: [email or web address]  Date: 01/16/2018  Prepared by: Donita BrooksSara Lezly Rumpf   Exercises  Supine Figure 4 Piriformis Stretch - 10 reps - 3 sets - 2x daily - 7x weekly  Supine Gluteus Stretch - 10 reps - 3 sets - 2x daily - 7x weekly  Supine Lower Trunk Rotation - 10 reps - 2x daily - 7x weekly  Side Stepping with Resistance at Feet - 4 reps - 1x daily - 7x weekly  Standing Repeated Hip Extension with Resistance - 10 reps - 2 sets - 1x daily - 7x weekly  Seated Trunk Rotation with Anchored Resistance - 10 reps - 2 sets - 1x daily - 7x weekly  Standing Row with Anchored Resistance - 20 reps - 2 sets - 1x daily - 7x weekly  Shoulder Extension Reactive Isometrics with Elbow Extended - 10 reps - 1 sets - 10 hold - 1x daily - 7x weekly    [facility] [address], [address] [place], [place] [address] Phone # [phone] Fax [phone]

## 2018-09-28 ENCOUNTER — Other Ambulatory Visit: Payer: Self-pay | Admitting: Otolaryngology

## 2018-09-28 DIAGNOSIS — H918X1 Other specified hearing loss, right ear: Secondary | ICD-10-CM

## 2018-09-28 DIAGNOSIS — H9311 Tinnitus, right ear: Secondary | ICD-10-CM

## 2018-09-28 DIAGNOSIS — IMO0001 Reserved for inherently not codable concepts without codable children: Secondary | ICD-10-CM

## 2018-10-02 ENCOUNTER — Other Ambulatory Visit (HOSPITAL_COMMUNITY): Payer: Self-pay | Admitting: Otolaryngology

## 2018-10-02 DIAGNOSIS — H918X1 Other specified hearing loss, right ear: Secondary | ICD-10-CM

## 2018-10-02 DIAGNOSIS — H9311 Tinnitus, right ear: Secondary | ICD-10-CM

## 2018-10-02 DIAGNOSIS — IMO0001 Reserved for inherently not codable concepts without codable children: Secondary | ICD-10-CM

## 2018-10-08 ENCOUNTER — Ambulatory Visit (HOSPITAL_COMMUNITY)
Admission: RE | Admit: 2018-10-08 | Discharge: 2018-10-08 | Disposition: A | Discharge status: Home / Self Care | Payer: BC Managed Care – PPO | Source: Ambulatory Visit | Attending: Otolaryngology | Admitting: Otolaryngology

## 2018-10-08 ENCOUNTER — Other Ambulatory Visit: Payer: Self-pay

## 2018-10-08 DIAGNOSIS — H9311 Tinnitus, right ear: Secondary | ICD-10-CM

## 2018-10-08 DIAGNOSIS — IMO0001 Reserved for inherently not codable concepts without codable children: Secondary | ICD-10-CM

## 2018-10-08 DIAGNOSIS — H918X1 Other specified hearing loss, right ear: Secondary | ICD-10-CM | POA: Diagnosis not present

## 2018-10-08 LAB — CREATININE, SERUM
Creatinine, Ser: 1.03 mg/dL — ABNORMAL HIGH (ref 0.44–1.00)
GFR calc Af Amer: >60 mL/min (ref >60)
GFR calc non Af Amer: 59 mL/min — ABNORMAL LOW (ref >60)

## 2018-10-08 MED ORDER — GADOBUTROL 1 MMOL/ML IV SOLN
10.0000 mL | Freq: Once | INTRAVENOUS | Status: AC | PRN
  Administered 2018-10-08: 10 mL via INTRAVENOUS

## 2018-10-12 ENCOUNTER — Encounter: Payer: Self-pay | Admitting: Neurology

## 2018-10-31 ENCOUNTER — Other Ambulatory Visit: Payer: BLUE CROSS/BLUE SHIELD

## 2018-11-11 NOTE — Progress Notes (Signed)
NEUROLOGY CONSULTATION NOTE  Patricia Ferrell MRN: [id number] DOB: May 21, 1955  Referring provider: Billy FischerAmanda Marcellino, MD Primary care provider: No PCP  Reason for consult:  Tinnitus   HISTORY OF PRESENT ILLNESS: Patricia Ferrell is a 63 year old female who presents for right sided tinnitus.  History supplemented by referring provider note.  In June, she woke in middle of night and right side of face felt numb, involving the cheek and around the right eye.  She describes it as a tingling sensation, like given novocain.  No associated headache, visual disturbance, otalgia, dizziness/vertigo, or rash.  In July, she developed loud ringing in her right ear.  She started having headaches, dull bifrontal, once in awhile.  She saw ENT.  Audiogram demonstrated moderate low-frequency sensorineural hearing loss rising up to normal hearing with worse word recognition score on right than the left.  Tympanogram revealed absent acoustic reflexes on the right.  Left ear was normal.  She then saw a dentist who prescribed a five day course of valacyclovir.  The tinnitus resolved and hearing returned.  She had an MRI of brain and internal auditory canals with and without contrast on 10/08/2018, which was personally reviewed and was normal.  However, she still has numbness in the right side of her face.  No preceding head injury or viral illness.   She notes numbness and tingling in the on bottom of foot, toes and leg on the right for the past 2 years.  Current medications:  Diclofenac 25mg ; venlafaxine XR 37.5mg  daily   PAST MEDICAL HISTORY: Past Medical History:  Diagnosis Date  . Anemia    years ago  . Anxiety   . Arthritis   . Asthma    bronchitis  . Back pain    lumbar  . Carpal tunnel syndrome   . Complication of anesthesia   . Family history of adverse reaction to anesthesia    mother's blood pressure always drops   . GERD (gastroesophageal reflux disease)   . History of hiatal hernia   .  Hyperlipidemia   . Perimenopausal   . PONV (postoperative nausea and vomiting)     PAST SURGICAL HISTORY: Past Surgical History:  Procedure Laterality Date  . ABDOMINAL HYSTERECTOMY     DUB  . CHOLECYSTECTOMY  06/19/2014  . CHOLECYSTECTOMY N/A 06/19/2014   Procedure: LAPAROSCOPIC CHOLECYSTECTOMY WITH INTRAOPERATIVE CHOLANGIOGRAM;  Surgeon: Manus RuddMatthew Tsuei, MD;  Location: [facility];  Service: General;  Laterality: N/A;  . COLONOSCOPY    . LUMBAR LAMINECTOMY/DECOMPRESSION MICRODISCECTOMY Right 09/16/2015   Procedure: Right - Lumbar five-sacral one  lumbar laminotomy and microdiscectomy;  Surgeon: Shirlean Kellyobert Nudelman, MD;  Location: [facility];  Service: Neurosurgery;  Laterality: Right;  . OOPHORECTOMY  02/21/94   left    MEDICATIONS: Current Outpatient Medications on File Prior to Visit  Medication Sig Dispense Refill  . cyclobenzaprine (FLEXERIL) 5 MG tablet Take 1 tablet (5 mg total) by mouth at bedtime as needed for muscle spasms. (Patient not taking: Reported on 11/21/2017) 20 tablet 0  . naproxen (NAPROSYN) 500 MG tablet Take 500 mg by mouth as needed.    Marland Kitchen. omeprazole (PRILOSEC) 40 MG capsule omeprazole 40 mg capsule,delayed release    . venlafaxine XR (EFFEXOR XR) 75 MG 24 hr capsule Effexor XR 75 mg capsule,extended release  Take 1 capsule every day by oral route.    . venlafaxine XR (EFFEXOR-XR) 37.5 MG 24 hr capsule Take by mouth.     No current facility-administered medications on file prior  to visit.     ALLERGIES: Allergies  Allergen Reactions  . Codeine Nausea And Vomiting    FAMILY HISTORY: Family History  Problem Relation Age of Onset  . Coronary artery disease Mother   . Heart attack Mother   . Hypertension Mother   . Colon polyps Father     SOCIAL HISTORY: Social History   Socioeconomic History  . Marital status: Single    Spouse name: Not on file  . Number of children: Not on file  . Years of education: Not on file  . Highest education level: Not on file   Occupational History  . Not on file  Social Needs  . Financial resource strain: Not on file  . Food insecurity    Worry: Not on file    Inability: Not on file  . Transportation needs    Medical: Not on file    Non-medical: Not on file  Tobacco Use  . Smoking status: Former Smoker    Quit date: 02/21/1988    Years since quitting: 30.7  . Smokeless tobacco: Never Used  Substance and Sexual Activity  . Alcohol use: Yes    Alcohol/week: 0.0 standard drinks    Comment: occasional  . Drug use: No  . Sexual activity: Never  Lifestyle  . Physical activity    Days per week: Not on file    Minutes per session: Not on file  . Stress: Not on file  Relationships  . Social Musician on phone: Not on file    Gets together: Not on file    Attends religious service: Not on file    Active member of club or organization: Not on file    Attends meetings of clubs or organizations: Not on file    Relationship status: Not on file  . Intimate partner violence    Fear of current or ex partner: Not on file    Emotionally abused: Not on file    Physically abused: Not on file    Forced sexual activity: Not on file  Other Topics Concern  . Not on file  Social History Narrative  . Not on file    REVIEW OF SYSTEMS: Constitutional: No fevers, chills, or sweats, no generalized fatigue, change in appetite Eyes: No visual changes, double vision, eye pain Ear, nose and throat: No hearing loss, ear pain, nasal congestion, sore throat Cardiovascular: No chest pain, palpitations Respiratory:  No shortness of breath at rest or with exertion, wheezes GastrointestinaI: No nausea, vomiting, diarrhea, abdominal pain, fecal incontinence Genitourinary:  No dysuria, urinary retention or frequency Musculoskeletal:  No neck pain, back pain Integumentary: No rash, pruritus, skin lesions Neurological: as above Psychiatric: No depression, insomnia, anxiety Endocrine: No palpitations, fatigue,  diaphoresis, mood swings, change in appetite, change in weight, increased thirst Hematologic/Lymphatic:  No purpura, petechiae. Allergic/Immunologic: no itchy/runny eyes, nasal congestion, recent allergic reactions, rashes  PHYSICAL EXAM: Blood pressure 130/70, pulse 74, height 5' 5.5" (1.664 m), weight 199 lb (90.3 kg), SpO2 95 %. General: No acute distress.  Patient appears well-groomed.   Head:  Normocephalic/atraumatic Eyes:  fundi examined but not visualized Neck: supple, no paraspinal tenderness, full range of motion Back: No paraspinal tenderness Heart: regular rate and rhythm Lungs: Clear to auscultation bilaterally. Vascular: No carotid bruits. Neurological Exam: Mental status: alert and oriented to person, place, and time, recent and remote memory intact, fund of knowledge intact, attention and concentration intact, speech fluent and not dysarthric, language intact. Cranial nerves: CN  I: not tested CN II: pupils equal, round and reactive to light, visual fields intact CN III, IV, VI:  full range of motion, no nystagmus, no ptosis CN V: facial sensation intact CN VII: upper and lower face symmetric CN VIII: hearing intact CN IX, X: gag intact, uvula midline CN XI: sternocleidomastoid and trapezius muscles intact CN XII: tongue midline Bulk & Tone: normal, no fasciculations. Motor:  5/5 throughout  Sensation:  Pinprick and vibration sensation intact. Deep Tendon Reflexes:  2+ throughout, toes downgoing.   Finger to nose testing:  Without dysmetria.   Heel to shin:  Without dysmetria.   Gait:  Normal station and stride.  Able to turn and tandem walk. Romberg negative.  IMPRESSION: 1.  Episode of right sided facial tingling, right sided hearing loss and tinnitus.  With residual right sided facial tingling.  MRI negative for acoustic neuroma or other acute abnormality.  Suspect a viral etiology.  We will check for other possible etiologies (autoimmune and infectious).   Unfortunately, I have no specific treatment for the tingling.  Consider gabapentin.  Patient defers.  PLAN: 1.  Check ANA, SSA/SSB antibodies, ACE and Lyme. 2.  Further recommendations pending results.  If testing negative, no further recommendations.   Thank you for allowing me to take part in the care of this patient.   Metta Clines, DO  CC:  Lind Guest, MD

## 2018-11-13 ENCOUNTER — Other Ambulatory Visit (INDEPENDENT_AMBULATORY_CARE_PROVIDER_SITE_OTHER): Payer: BC Managed Care – PPO

## 2018-11-13 ENCOUNTER — Encounter: Payer: Self-pay | Admitting: Neurology

## 2018-11-13 ENCOUNTER — Ambulatory Visit: Payer: BC Managed Care – PPO | Admitting: Neurology

## 2018-11-13 ENCOUNTER — Other Ambulatory Visit: Payer: Self-pay

## 2018-11-13 VITALS — BP 130/70 | HR 74 | Ht 65.5 in | Wt 199.0 lb

## 2018-11-13 DIAGNOSIS — R200 Anesthesia of skin: Secondary | ICD-10-CM

## 2018-11-13 DIAGNOSIS — G44219 Episodic tension-type headache, not intractable: Secondary | ICD-10-CM | POA: Diagnosis not present

## 2018-11-13 DIAGNOSIS — H9311 Tinnitus, right ear: Secondary | ICD-10-CM | POA: Diagnosis not present

## 2018-11-13 DIAGNOSIS — H9191 Unspecified hearing loss, right ear: Secondary | ICD-10-CM

## 2018-11-13 NOTE — Patient Instructions (Signed)
I think you probably had a viral illness that affected a couple of the nerves in the brain that supplies hearing and sensation in face.  1.  I want to check more labs:  ANA, SSA/SSB antibodies, Lyme, ACE.     If unremarkable, then no further recommendations.  Unfortunately, I have no specific treatment for the numbness.

## 2018-11-14 LAB — SJOGREN'S SYNDROME ANTIBODS(SSA + SSB)
SSA (Ro) (ENA) Antibody, IgG: <1 AI
SSB (La) (ENA) Antibody, IgG: <1 AI

## 2018-11-14 LAB — ANA: Anti Nuclear Antibody (ANA): NEGATIVE

## 2018-11-14 LAB — ANGIOTENSIN CONVERTING ENZYME: Angiotensin-Converting Enzyme: 26 U/L (ref 9–67)

## 2018-11-15 LAB — SPECIMEN STATUS REPORT

## 2018-11-15 LAB — LYME AB/WESTERN BLOT REFLEX
LYME DISEASE AB, QUANT, IGM: <0.8 index (ref 0.00–0.79)
Lyme IgG/IgM Ab: <0.91 {ISR} (ref 0.00–0.90)

## 2019-02-10 ENCOUNTER — Encounter (HOSPITAL_BASED_OUTPATIENT_CLINIC_OR_DEPARTMENT_OTHER): Payer: Self-pay | Admitting: Emergency Medicine

## 2019-02-10 ENCOUNTER — Emergency Department (HOSPITAL_BASED_OUTPATIENT_CLINIC_OR_DEPARTMENT_OTHER): Payer: BC Managed Care – PPO

## 2019-02-10 ENCOUNTER — Emergency Department (HOSPITAL_BASED_OUTPATIENT_CLINIC_OR_DEPARTMENT_OTHER)
Admission: EM | Admit: 2019-02-10 | Discharge: 2019-02-10 | Disposition: A | Discharge status: Home / Self Care | Payer: BC Managed Care – PPO | Attending: Emergency Medicine | Admitting: Emergency Medicine

## 2019-02-10 ENCOUNTER — Other Ambulatory Visit: Payer: Self-pay

## 2019-02-10 DIAGNOSIS — Z79899 Other long term (current) drug therapy: Secondary | ICD-10-CM | POA: Diagnosis not present

## 2019-02-10 DIAGNOSIS — W19XXXA Unspecified fall, initial encounter: Secondary | ICD-10-CM

## 2019-02-10 DIAGNOSIS — M25561 Pain in right knee: Secondary | ICD-10-CM

## 2019-02-10 DIAGNOSIS — J45909 Unspecified asthma, uncomplicated: Secondary | ICD-10-CM | POA: Diagnosis not present

## 2019-02-10 DIAGNOSIS — Z87891 Personal history of nicotine dependence: Secondary | ICD-10-CM | POA: Insufficient documentation

## 2019-02-10 MED ORDER — ACETAMINOPHEN 325 MG PO TABS
650.0000 mg | ORAL_TABLET | Freq: Once | ORAL | Status: AC
  Administered 2019-02-10: 650 mg via ORAL
  Filled 2019-02-10: qty 2

## 2019-02-10 NOTE — Discharge Instructions (Addendum)

## 2019-02-10 NOTE — ED Triage Notes (Signed)
She tripped over a dog bone last night and fell.. c/o R knee pain

## 2019-02-10 NOTE — ED Provider Notes (Signed)
[facility] EMERGENCY DEPARTMENT Provider Note   CSN: [id number] Arrival date & time: 02/10/19  0915     History Chief Complaint  Patient presents with  . Fall    Stepahnie Ferrell is a 63 y.o. female.  HPI   Patient 63 year old female with history of anemia, anxiety, arthritis, asthma, carpal tunnel syndrome, GERD, hyperlipidemia, who presents the emergency department today for evaluation after a fall.  States that she stepped on a dog bone last night which caused her to slip and fall forward mostly onto her knees.  States the majority of the impact occurred to the right medial knee.  She has pain when she moves a certain way.  Rates pain 3/10.  Has been ambulatory since this occurred.  Denies head trauma, LOC, neck or back pain. No numbness, weakness.  Past Medical History:  Diagnosis Date  . Anemia    years ago  . Anxiety   . Arthritis   . Asthma    bronchitis  . Back pain    lumbar  . Carpal tunnel syndrome   . Complication of anesthesia   . Family history of adverse reaction to anesthesia    mother's blood pressure always drops   . GERD (gastroesophageal reflux disease)   . History of hiatal hernia   . Hyperlipidemia   . Perimenopausal   . PONV (postoperative nausea and vomiting)     Patient Active Problem List   Diagnosis Date Noted  . Seborrheic keratosis, inflamed 05/02/2017  . HNP (herniated nucleus pulposus), lumbar 09/16/2015  . Radicular pain of right lower extremity 07/24/2015  . Calculus of gallbladder with acute cholecystitis without obstruction 06/20/2014  . Abdominal pain 06/17/2014  . Diarrhea 06/17/2014  . Allergic rhinitis 09/08/2008  . Anxiety disorder, unspecified 09/02/2008  . HYPERLIPIDEMIA 02/18/2008  . 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 PERIMENOPAUSAL SYNDROME 02/06/2008  . CARPAL TUNNEL SYNDROME, BILATERAL, HX OF 02/06/2008  . ASTHMA 08/21/2007  . SEBORRHEIC KERATOSIS 08/21/2007  . [place] DISEASE, LUMBAR 07/25/2007    . EXTRINSIC ASTHMA, UNSPECIFIED 05/04/2007    Past Surgical History:  Procedure Laterality Date  . ABDOMINAL HYSTERECTOMY     DUB  . CHOLECYSTECTOMY  06/19/2014  . CHOLECYSTECTOMY N/A 06/19/2014   Procedure: LAPAROSCOPIC CHOLECYSTECTOMY WITH INTRAOPERATIVE CHOLANGIOGRAM;  Surgeon: Donnie Mesa, MD;  Location: [facility];  Service: General;  Laterality: N/A;  . COLONOSCOPY    . LUMBAR LAMINECTOMY/DECOMPRESSION MICRODISCECTOMY Right 09/16/2015   Procedure: Right - Lumbar five-sacral one  lumbar laminotomy and microdiscectomy;  Surgeon: Jovita Gamma, MD;  Location: [facility];  Service: Neurosurgery;  Laterality: Right;  . OOPHORECTOMY  02/21/94   left     OB History   No obstetric history on file.     Family History  Problem Relation Age of Onset  . Coronary artery disease Mother   . Heart attack Mother   . Hypertension Mother   . Colon polyps Father     Social History   Tobacco Use  . Smoking status: Former Smoker    Quit date: 02/21/1988    Years since quitting: 30.9  . Smokeless tobacco: Never Used  Substance Use Topics  . Alcohol use: Yes    Alcohol/week: 0.0 standard drinks    Comment: occasional  . Drug use: No    Home Medications Prior to Admission medications   Medication Sig Start Date End Date Taking? Authorizing Provider  cyclobenzaprine (FLEXERIL) 5 MG tablet Take 1 tablet (5 mg total) by mouth at bedtime as  needed for muscle spasms. Patient not taking: Reported on 11/21/2017 09/15/16   Terressa Koyanagi, DO  diclofenac (VOLTAREN) 25 MG EC tablet Take 25 mg by mouth 2 (two) times daily. Unsure of exact dose    [provider]  naproxen (NAPROSYN) 500 MG tablet Take 500 mg by mouth as needed.    [provider]  omeprazole (PRILOSEC) 40 MG capsule omeprazole 40 mg capsule,delayed release    [provider]  venlafaxine XR (EFFEXOR XR) 75 MG 24 hr capsule Effexor XR 75 mg capsule,extended release  Take 1 capsule every day by oral route.     [provider]  venlafaxine XR (EFFEXOR-XR) 37.5 MG 24 hr capsule Take by mouth.    [provider]    Allergies    Codeine  Review of Systems   Review of Systems  Constitutional: Negative for fever.  Musculoskeletal:       Right knee pain  Neurological: Negative for weakness and numbness.    Physical Exam Updated Vital Signs BP 130/74 (BP Location: Left Arm)   Pulse 84   Temp 97.9 F (36.6 C) (Oral)   Resp 18   Ht 5\' 5"  (1.651 m)   Wt 86.2 kg   SpO2 99%   BMI 31.62 kg/m   Physical Exam Vitals and nursing note reviewed.  Constitutional:      General: She is not in acute distress.    Appearance: She is well-developed.  HENT:     Head: Normocephalic and atraumatic.  Eyes:     Conjunctiva/sclera: Conjunctivae normal.  Cardiovascular:     Rate and Rhythm: Normal rate.  Pulmonary:     Effort: Pulmonary effort is normal.  Musculoskeletal:        General: Normal range of motion.     Cervical back: Neck supple.     Comments: TTP to the medial aspect of the right knee. Mild joint laxity of the right MCL as compared to the left. Otherwise no other laxity observed.   Skin:    General: Skin is warm and dry.  Neurological:     Mental Status: She is alert.     ED Results / Procedures / Treatments   Labs (all labs ordered are listed, but only abnormal results are displayed) Labs Reviewed - No data to display  EKG None  Radiology DG Knee Complete 4 Views Right  Result Date: 02/10/2019 CLINICAL DATA:  Knee pain after tripping over dog toy last night. EXAM: RIGHT KNEE - COMPLETE 4+ VIEW COMPARISON:  None. FINDINGS: No acute fracture or dislocation. No joint effusion. Joint spaces are preserved. Tiny marginal osteophytes. Bone mineralization is normal. Soft tissues are unremarkable. IMPRESSION: No acute osseous abnormality. Electronically Signed   By: 02/12/2019 M.D.   On: 02/10/2019 10:18    Procedures Procedures (including critical care  time)  Medications Ordered in ED Medications  acetaminophen (TYLENOL) tablet 650 mg (650 mg Oral Given 02/10/19 0949)    ED Course  I have reviewed the triage vital signs and the nursing notes.  Pertinent labs & imaging results that were available during my care of the patient were reviewed by me and considered in my medical decision making (see chart for details).    MDM Rules/Calculators/A&P                     63 y/o f presenting after mechanical fall c/o right knee pain. ttp to the medial aspect of the right knee with mild  joint laxity of the MCL. NVI. Xray negative for fx. Pt placed in knee immobilizer and given crutches. Will be given ortho f/u for further eval for possible MCL injury. Advised tylenol/motrin, RICE protocol. Advised on return precautions. She voices understanding of the plan and reasons to return. All questions answered, pt stable for d.c.    Final Clinical Impression(s) / ED Diagnoses Final diagnoses:  Fall, initial encounter  Acute pain of right knee    Rx / DC Orders ED Discharge Orders    None       Karrie MeresCouture, Fredric Slabach S, PA-C 02/10/19 1036    Geoffery Lyonselo, Douglas, MD 02/13/19 1503

## 2019-02-10 NOTE — ED Notes (Signed)
Patient transported to X-ray 

## 2019-05-10 ENCOUNTER — Ambulatory Visit: Payer: Self-pay

## 2019-05-29 ENCOUNTER — Other Ambulatory Visit: Payer: Self-pay

## 2019-05-29 ENCOUNTER — Encounter (HOSPITAL_BASED_OUTPATIENT_CLINIC_OR_DEPARTMENT_OTHER): Payer: Self-pay | Admitting: Orthopaedic Surgery

## 2019-06-03 ENCOUNTER — Other Ambulatory Visit (HOSPITAL_COMMUNITY)
Admission: RE | Admit: 2019-06-03 | Discharge: 2019-06-03 | Disposition: A | Discharge status: Home / Self Care | Payer: 59 | Source: Ambulatory Visit | Attending: Orthopaedic Surgery | Admitting: Orthopaedic Surgery

## 2019-06-03 DIAGNOSIS — Z01812 Encounter for preprocedural laboratory examination: Secondary | ICD-10-CM | POA: Diagnosis present

## 2019-06-03 DIAGNOSIS — Z20822 Contact with and (suspected) exposure to covid-19: Secondary | ICD-10-CM | POA: Insufficient documentation

## 2019-06-03 LAB — SARS CORONAVIRUS 2 (TAT 6-24 HRS): SARS Coronavirus 2: NEGATIVE

## 2019-06-03 NOTE — H&P (Signed)
PREOPERATIVE H&P  Chief Complaint: RIGHT KNEE CHONDROMALACIA PATELLA, LATERAL MENISCUS TEAR, CHRONIC INSTABILITY M22.41, S83.289A, M23.51  HPI: Patricia Ferrell is a 64 y.o. female who is scheduled for KNEE ARTHROSCOPY WITH LATERAL MENISECTOMY KNEE ARTHROSCOPY WITH MEDIAL COLLATERAL LIGAMENT RECONSTRUCTION KNEE ARTHROSCOPY WITH CHONDROPLASTY.   Patient has a past medical history significant for PONV, hyperlipidemia, GERD, anemia.   64 year-old female who injured her right knee on February 10, 2019.  She did a split and had a valgus twisting injury to her knee.  She has medial joint line tenderness.  She has failed a trial of non-operative measures.   Her symptoms are rated as moderate to severe, and have been worsening.  This is significantly impairing activities of daily living.    Please see clinic note for further details on this patient's care.    She has elected for surgical management.   Past Medical History:  Diagnosis Date  . Anemia    years ago  . Anxiety   . Arthritis   . Asthma    bronchitis  . Back pain    lumbar  . Carpal tunnel syndrome   . Complication of anesthesia   . Family history of adverse reaction to anesthesia    mother's blood pressure always drops   . GERD (gastroesophageal reflux disease)   . History of hiatal hernia   . Hyperlipidemia   . Perimenopausal   . PONV (postoperative nausea and vomiting)    Past Surgical History:  Procedure Laterality Date  . ABDOMINAL HYSTERECTOMY     DUB  . CHOLECYSTECTOMY  06/19/2014  . CHOLECYSTECTOMY N/A 06/19/2014   Procedure: LAPAROSCOPIC CHOLECYSTECTOMY WITH INTRAOPERATIVE CHOLANGIOGRAM;  Surgeon: Donnie Mesa, MD;  Location: [facility];  Service: General;  Laterality: N/A;  . COLONOSCOPY    . LUMBAR LAMINECTOMY/DECOMPRESSION MICRODISCECTOMY Right 09/16/2015   Procedure: Right - Lumbar five-sacral one  lumbar laminotomy and microdiscectomy;  Surgeon: Jovita Gamma, MD;  Location: [facility];  Service:  Neurosurgery;  Laterality: Right;  . OOPHORECTOMY  02/21/94   left   Social History   Socioeconomic History  . Marital status: Divorced    Spouse name: Not on file  . Number of children: 1  . Years of education: 96  . Highest education level: Not on file  Occupational History  . Occupation: special needs care  Tobacco Use  . Smoking status: Former Smoker    Quit date: 02/21/1988    Years since quitting: 31.3  . Smokeless tobacco: Never Used  Substance and Sexual Activity  . Alcohol use: Yes    Alcohol/week: 0.0 standard drinks    Comment: occasional  . Drug use: No  . Sexual activity: Never  Other Topics Concern  . Not on file  Social History Narrative   One story home   One child   Decaf mostly    Right handed    Social Determinants of Health   Financial Resource Strain:   . Difficulty of Paying Living Expenses:   Food Insecurity:   . Worried About Charity fundraiser in the Last Year:   . Arboriculturist in the Last Year:   Transportation Needs:   . Film/video editor (Medical):   Marland Kitchen Lack of Transportation (Non-Medical):   Physical Activity:   . Days of Exercise per Week:   . Minutes of Exercise per Session:   Stress:   . Feeling of Stress :   Social Connections:   . Frequency of Communication  with Friends and Family:   . Frequency of Social Gatherings with Friends and Family:   . Attends Religious Services:   . Active Member of Clubs or Organizations:   . Attends Banker Meetings:   Marland Kitchen Marital Status:    Family History  Problem Relation Age of Onset  . Coronary artery disease Mother   . Heart attack Mother   . Hypertension Mother   . Colon polyps Father    Allergies  Allergen Reactions  . Hydrocodone Nausea And Vomiting  . Codeine Nausea And Vomiting   Prior to Admission medications   Medication Sig Start Date End Date Taking? Authorizing Provider  diclofenac (VOLTAREN) 25 MG EC tablet Take 25 mg by mouth 2 (two) times daily.  Unsure of exact dose   Yes [provider]  famotidine (PEPCID) 20 MG tablet Take 20 mg by mouth 2 (two) times daily.   Yes [provider]  naproxen (NAPROSYN) 500 MG tablet Take 500 mg by mouth as needed.   Yes [provider]  PARoxetine (PAXIL) 20 MG tablet Take 20 mg by mouth daily.   Yes [provider]    ROS: All other systems have been reviewed and were otherwise negative with the exception of those mentioned in the HPI and as above.  Physical Exam: General: Alert, no acute distress Cardiovascular: No pedal edema Respiratory: No cyanosis, no use of accessory musculature GI: No organomegaly, abdomen is soft and non-tender Skin: No lesions in the area of chief complaint Neurologic: Sensation intact distally Psychiatric: Patient is competent for consent with normal mood and affect Lymphatic: No axillary or cervical lymphadenopathy  MUSCULOSKELETAL:  Right knee: Grade II opening of her MCL with increased translation to the contralateral side.  Range of motion is otherwise full of the knee.  She is tender to palpation about the MCL, as well as the anterolateral aspect of the knee, consistent with lateral meniscus tear.    Imaging: MRI right knee demonstrates lateral anterior horn meniscus tear and MCL tear.   Assessment: RIGHT KNEE CHONDROMALACIA PATELLA, LATERAL MENISCUS TEAR, CHRONIC INSTABILITY M22.41, S83.289A, M23.51  Plan: Plan for Procedure(s): KNEE ARTHROSCOPY WITH LATERAL MENISECTOMY KNEE ARTHROSCOPY WITH MEDIAL COLLATERAL LIGAMENT RECONSTRUCTION KNEE ARTHROSCOPY WITH CHONDROPLASTY   Patient and Dr. Everardo Pacific talked about an allograft tibialis anterior MCL reconstruction with internal bracing.  Patient understands the plan of care. We will also perform right knee arthroscopy with lateral meniscectomy.  If she has significant knee disease that would benefit more from a total knee arthroplasty then we would likely abort on the MCL  reconstruction.  The risks benefits and alternatives were discussed with the patient including but not limited to the risks of nonoperative treatment, versus surgical intervention including infection, bleeding, nerve injury,  blood clots, cardiopulmonary complications, morbidity, mortality, among others, and they were willing to proceed.   The patient acknowledged the explanation, agreed to proceed with the plan and consent was signed.   Operative Plan: Right knee scope with lateral meniscectomy, MCL reconstruction with tibialis anterior allograft and internal bracing. If patient has significant osteoarthritis then may abort MCL reconstruction Discharge Medications: Tramadol, Diclofenac, Tylenol, Zofran, Aspirin, Omeprazole - prescriptions sent in the day before surgery DVT Prophylaxis: Aspirin Physical Therapy: Outpatient PT Special Discharge needs: Knee immobilizer.    Vernetta Honey, PA-C  06/03/2019 5:04 PM

## 2019-06-03 NOTE — Progress Notes (Signed)

## 2019-06-06 ENCOUNTER — Encounter (HOSPITAL_BASED_OUTPATIENT_CLINIC_OR_DEPARTMENT_OTHER)
Admission: RE | Disposition: A | Discharge status: Home / Self Care | Payer: Self-pay | Source: Home / Self Care | Attending: Orthopaedic Surgery

## 2019-06-06 ENCOUNTER — Encounter (HOSPITAL_BASED_OUTPATIENT_CLINIC_OR_DEPARTMENT_OTHER): Payer: Self-pay | Admitting: Orthopaedic Surgery

## 2019-06-06 ENCOUNTER — Other Ambulatory Visit: Payer: Self-pay

## 2019-06-06 ENCOUNTER — Ambulatory Visit (HOSPITAL_BASED_OUTPATIENT_CLINIC_OR_DEPARTMENT_OTHER)
Admission: RE | Admit: 2019-06-06 | Discharge: 2019-06-06 | Disposition: A | Discharge status: Home / Self Care | Payer: 59 | Attending: Orthopaedic Surgery | Admitting: Orthopaedic Surgery

## 2019-06-06 ENCOUNTER — Ambulatory Visit (HOSPITAL_BASED_OUTPATIENT_CLINIC_OR_DEPARTMENT_OTHER): Payer: 59 | Admitting: Anesthesiology

## 2019-06-06 DIAGNOSIS — M2241 Chondromalacia patellae, right knee: Secondary | ICD-10-CM | POA: Diagnosis not present

## 2019-06-06 DIAGNOSIS — F419 Anxiety disorder, unspecified: Secondary | ICD-10-CM | POA: Insufficient documentation

## 2019-06-06 DIAGNOSIS — Z87891 Personal history of nicotine dependence: Secondary | ICD-10-CM | POA: Insufficient documentation

## 2019-06-06 DIAGNOSIS — K219 Gastro-esophageal reflux disease without esophagitis: Secondary | ICD-10-CM | POA: Diagnosis not present

## 2019-06-06 DIAGNOSIS — Z79899 Other long term (current) drug therapy: Secondary | ICD-10-CM | POA: Insufficient documentation

## 2019-06-06 DIAGNOSIS — E785 Hyperlipidemia, unspecified: Secondary | ICD-10-CM | POA: Insufficient documentation

## 2019-06-06 DIAGNOSIS — S83281A Other tear of lateral meniscus, current injury, right knee, initial encounter: Secondary | ICD-10-CM | POA: Diagnosis present

## 2019-06-06 DIAGNOSIS — X501XXA Overexertion from prolonged static or awkward postures, initial encounter: Secondary | ICD-10-CM | POA: Insufficient documentation

## 2019-06-06 DIAGNOSIS — J45909 Unspecified asthma, uncomplicated: Secondary | ICD-10-CM | POA: Diagnosis not present

## 2019-06-06 DIAGNOSIS — S83411A Sprain of medial collateral ligament of right knee, initial encounter: Secondary | ICD-10-CM | POA: Insufficient documentation

## 2019-06-06 HISTORY — PX: CHONDROPLASTY: SHX5177

## 2019-06-06 HISTORY — PX: KNEE ARTHROSCOPY WITH LATERAL MENISECTOMY: SHX6193

## 2019-06-06 HISTORY — PX: KNEE ARTHROSCOPY WITH MEDIAL COLLATERAL LIGAMENT RECONSTRUCTION: SHX5650

## 2019-06-06 SURGERY — ARTHROSCOPY, KNEE, WITH LATERAL MENISCECTOMY
Anesthesia: Regional | Site: Knee | Laterality: Right

## 2019-06-06 MED ORDER — PROPOFOL 10 MG/ML IV BOLUS
INTRAVENOUS | Status: DC | PRN
  Administered 2019-06-06: 150 mg via INTRAVENOUS
  Administered 2019-06-06: 50 mg via INTRAVENOUS

## 2019-06-06 MED ORDER — LIDOCAINE HCL (CARDIAC) PF 100 MG/5ML IV SOSY
PREFILLED_SYRINGE | INTRAVENOUS | Status: DC | PRN
  Administered 2019-06-06: 60 mg via INTRATRACHEAL

## 2019-06-06 MED ORDER — KETOROLAC TROMETHAMINE 30 MG/ML IJ SOLN
INTRAMUSCULAR | Status: AC
  Filled 2019-06-06: qty 1

## 2019-06-06 MED ORDER — ONDANSETRON HCL 4 MG/2ML IJ SOLN
INTRAMUSCULAR | Status: AC
  Filled 2019-06-06: qty 2

## 2019-06-06 MED ORDER — FENTANYL CITRATE (PF) 100 MCG/2ML IJ SOLN
25.0000 ug | INTRAMUSCULAR | Status: DC | PRN
  Administered 2019-06-06 (×3): 50 ug via INTRAVENOUS

## 2019-06-06 MED ORDER — SODIUM CHLORIDE 0.9 % IR SOLN
Status: DC | PRN
  Administered 2019-06-06: 3000 mL

## 2019-06-06 MED ORDER — DEXAMETHASONE SODIUM PHOSPHATE 10 MG/ML IJ SOLN
INTRAMUSCULAR | Status: DC | PRN
  Administered 2019-06-06: 5 mg via INTRAVENOUS

## 2019-06-06 MED ORDER — FENTANYL CITRATE (PF) 100 MCG/2ML IJ SOLN
INTRAMUSCULAR | Status: AC
  Filled 2019-06-06: qty 2

## 2019-06-06 MED ORDER — OXYCODONE HCL 5 MG PO TABS: ORAL_TABLET | ORAL | 0 refills | Status: AC

## 2019-06-06 MED ORDER — PHENYLEPHRINE HCL (PRESSORS) 10 MG/ML IV SOLN
INTRAVENOUS | Status: DC | PRN
  Administered 2019-06-06 (×2): 80 ug via INTRAVENOUS
  Administered 2019-06-06 (×3): 120 ug via INTRAVENOUS

## 2019-06-06 MED ORDER — ARTIFICIAL TEARS OPHTHALMIC OINT
TOPICAL_OINTMENT | OPHTHALMIC | Status: AC
  Filled 2019-06-06: qty 3.5

## 2019-06-06 MED ORDER — PHENYLEPHRINE 40 MCG/ML (10ML) SYRINGE FOR IV PUSH (FOR BLOOD PRESSURE SUPPORT)
PREFILLED_SYRINGE | INTRAVENOUS | Status: AC
  Filled 2019-06-06: qty 10

## 2019-06-06 MED ORDER — GLYCOPYRROLATE 0.2 MG/ML IJ SOLN
INTRAMUSCULAR | Status: AC
  Filled 2019-06-06: qty 2

## 2019-06-06 MED ORDER — PROPOFOL 10 MG/ML IV BOLUS
INTRAVENOUS | Status: AC
  Filled 2019-06-06: qty 20

## 2019-06-06 MED ORDER — DEXAMETHASONE SODIUM PHOSPHATE 10 MG/ML IJ SOLN
INTRAMUSCULAR | Status: AC
  Filled 2019-06-06: qty 1

## 2019-06-06 MED ORDER — MIDAZOLAM HCL 2 MG/2ML IJ SOLN
INTRAMUSCULAR | Status: AC
  Filled 2019-06-06: qty 2

## 2019-06-06 MED ORDER — CEFAZOLIN SODIUM-DEXTROSE 2-4 GM/100ML-% IV SOLN
2.0000 g | INTRAVENOUS | Status: AC
  Administered 2019-06-06: 2 g via INTRAVENOUS

## 2019-06-06 MED ORDER — ARTIFICIAL TEARS OPHTHALMIC OINT
TOPICAL_OINTMENT | OPHTHALMIC | Status: DC | PRN
  Administered 2019-06-06: 1 via OPHTHALMIC

## 2019-06-06 MED ORDER — LIDOCAINE 2% (20 MG/ML) 5 ML SYRINGE
INTRAMUSCULAR | Status: AC
  Filled 2019-06-06: qty 5

## 2019-06-06 MED ORDER — GLYCOPYRROLATE 0.2 MG/ML IJ SOLN
INTRAMUSCULAR | Status: DC | PRN
  Administered 2019-06-06: .2 mg via INTRAVENOUS

## 2019-06-06 MED ORDER — VANCOMYCIN HCL 1000 MG IV SOLR
INTRAVENOUS | Status: AC
  Filled 2019-06-06: qty 1000

## 2019-06-06 MED ORDER — MIDAZOLAM HCL 2 MG/2ML IJ SOLN
1.0000 mg | INTRAMUSCULAR | Status: DC | PRN
  Administered 2019-06-06: 08:00:00 2 mg via INTRAVENOUS

## 2019-06-06 MED ORDER — ACETAMINOPHEN 10 MG/ML IV SOLN
1000.0000 mg | Freq: Four times a day (QID) | INTRAVENOUS | Status: DC
  Administered 2019-06-06: 1000 mg via INTRAVENOUS

## 2019-06-06 MED ORDER — ONDANSETRON HCL 4 MG/2ML IJ SOLN
INTRAMUSCULAR | Status: DC | PRN
  Administered 2019-06-06: 4 mg via INTRAVENOUS

## 2019-06-06 MED ORDER — ACETAMINOPHEN 10 MG/ML IV SOLN
INTRAVENOUS | Status: AC
  Filled 2019-06-06: qty 100

## 2019-06-06 MED ORDER — LACTATED RINGERS IV SOLN: INTRAVENOUS | Status: DC

## 2019-06-06 MED ORDER — KETOROLAC TROMETHAMINE 30 MG/ML IJ SOLN
30.0000 mg | Freq: Once | INTRAMUSCULAR | Status: AC | PRN
  Administered 2019-06-06: 30 mg via INTRAVENOUS

## 2019-06-06 MED ORDER — FENTANYL CITRATE (PF) 100 MCG/2ML IJ SOLN
50.0000 ug | INTRAMUSCULAR | Status: AC | PRN
  Administered 2019-06-06: 100 ug via INTRAVENOUS
  Administered 2019-06-06 (×2): 25 ug via INTRAVENOUS
  Administered 2019-06-06: 50 ug via INTRAVENOUS

## 2019-06-06 MED ORDER — CEFAZOLIN SODIUM-DEXTROSE 2-4 GM/100ML-% IV SOLN
INTRAVENOUS | Status: AC
  Filled 2019-06-06: qty 100

## 2019-06-06 MED ORDER — VANCOMYCIN HCL 1000 MG IV SOLR
INTRAVENOUS | Status: DC | PRN
  Administered 2019-06-06: 1000 mg via TOPICAL

## 2019-06-06 SURGICAL SUPPLY — 86 items
BANDAGE ESMARK 6X9 LF (GAUZE/BANDAGES/DRESSINGS) ×1 IMPLANT
BENZOIN TINCTURE PRP APPL 2/3 (GAUZE/BANDAGES/DRESSINGS) ×2 IMPLANT
BLADE HEX COATED 2.75 (ELECTRODE) ×2 IMPLANT
BLADE SHAVER BONE 5.0X13 (MISCELLANEOUS) IMPLANT
BLADE SURG 10 STRL SS (BLADE) ×1 IMPLANT
BLADE SURG 15 STRL LF DISP TIS (BLADE) IMPLANT
BLADE SURG 15 STRL SS (BLADE) ×2
BNDG ELASTIC 6X5.8 VLCR STR LF (GAUZE/BANDAGES/DRESSINGS) ×2 IMPLANT
BNDG ESMARK 6X9 LF (GAUZE/BANDAGES/DRESSINGS)
BURR OVAL 8 FLU 4.0X13 (MISCELLANEOUS) IMPLANT
CHLORAPREP W/TINT 26 (MISCELLANEOUS) ×2 IMPLANT
CLSR STERI-STRIP ANTIMIC 1/2X4 (GAUZE/BANDAGES/DRESSINGS) ×2 IMPLANT
COVER WAND RF STERILE (DRAPES) IMPLANT
CUFF TOURN SGL QUICK 34 (TOURNIQUET CUFF) ×2
CUFF TRNQT CYL 34X4.125X (TOURNIQUET CUFF) ×1 IMPLANT
DISSECTOR 3.5MM X 13CM CVD (MISCELLANEOUS) IMPLANT
DISSECTOR 4.0MMX13CM CVD (MISCELLANEOUS) ×1 IMPLANT
DRAPE ARTHROSCOPY W/POUCH 90 (DRAPES) ×2 IMPLANT
DRAPE IMP U-DRAPE 54X76 (DRAPES) IMPLANT
DRAPE OEC MINIVIEW 54X84 (DRAPES) IMPLANT
DRAPE SWITCH (DRAPES) ×1 IMPLANT
DRAPE TOP ARMCOVERS (MISCELLANEOUS) ×2 IMPLANT
DRAPE U-SHAPE 47X51 STRL (DRAPES) ×2 IMPLANT
DRSG EMULSION OIL 3X3 NADH (GAUZE/BANDAGES/DRESSINGS) ×1 IMPLANT
ELECT REM PT RETURN 9FT ADLT (ELECTROSURGICAL) ×2
ELECTRODE REM PT RTRN 9FT ADLT (ELECTROSURGICAL) IMPLANT
GAUZE SPONGE 4X4 12PLY STRL (GAUZE/BANDAGES/DRESSINGS) ×3 IMPLANT
GLOVE BIO SURGEON STRL SZ 6.5 (GLOVE) ×2 IMPLANT
GLOVE BIO SURGEON STRL SZ7.5 (GLOVE) ×1 IMPLANT
GLOVE BIOGEL PI IND STRL 6.5 (GLOVE) ×1 IMPLANT
GLOVE BIOGEL PI IND STRL 7.0 (GLOVE) IMPLANT
GLOVE BIOGEL PI IND STRL 7.5 (GLOVE) IMPLANT
GLOVE BIOGEL PI IND STRL 8 (GLOVE) ×2 IMPLANT
GLOVE BIOGEL PI INDICATOR 6.5 (GLOVE) ×1
GLOVE BIOGEL PI INDICATOR 7.0 (GLOVE) ×2
GLOVE BIOGEL PI INDICATOR 7.5 (GLOVE) ×1
GLOVE BIOGEL PI INDICATOR 8 (GLOVE) ×1
GLOVE ECLIPSE 6.5 STRL STRAW (GLOVE) ×1 IMPLANT
GLOVE ECLIPSE 8.0 STRL XLNG CF (GLOVE) ×3 IMPLANT
GOWN STRL REUS W/ TWL LRG LVL3 (GOWN DISPOSABLE) ×2 IMPLANT
GOWN STRL REUS W/ TWL XL LVL3 (GOWN DISPOSABLE) ×1 IMPLANT
GOWN STRL REUS W/TWL LRG LVL3 (GOWN DISPOSABLE) ×2
GOWN STRL REUS W/TWL XL LVL3 (GOWN DISPOSABLE) ×3 IMPLANT
GRAFT TISS ANT TIB TNDN (Tissue) IMPLANT
IMMOBILIZER KNEE 22 UNIV (SOFTGOODS) ×1 IMPLANT
IMMOBILIZER KNEE 24 THIGH 36 (MISCELLANEOUS) IMPLANT
IMMOBILIZER KNEE 24 UNIV (MISCELLANEOUS)
KIT BIO-TENODESIS 3X8 DISP (MISCELLANEOUS)
KIT INSRT BABSR STRL DISP BTN (MISCELLANEOUS) IMPLANT
KIT LEG STABILIZATION (KITS) ×1 IMPLANT
KNEE WRAP E Z 3 GEL PACK (MISCELLANEOUS) IMPLANT
MANIFOLD NEPTUNE II (INSTRUMENTS) ×2 IMPLANT
NDL SAFETY ECLIPSE 18X1.5 (NEEDLE) ×1 IMPLANT
NDL SUT 6 .5 CRC .975X.05 MAYO (NEEDLE) IMPLANT
NEEDLE HYPO 18GX1.5 SHARP (NEEDLE) ×2
NEEDLE MAYO TAPER (NEEDLE) ×2
NS IRRIG 1000ML POUR BTL (IV SOLUTION) ×1 IMPLANT
PACK ARTHROSCOPY DSU (CUSTOM PROCEDURE TRAY) ×2 IMPLANT
PACK BASIN DAY SURGERY FS (CUSTOM PROCEDURE TRAY) ×2 IMPLANT
PENCIL SMOKE EVACUATOR (MISCELLANEOUS) ×1 IMPLANT
PORT APPOLLO RF 90DEGREE MULTI (SURGICAL WAND) IMPLANT
SPONGE LAP 4X18 RFD (DISPOSABLE) ×1 IMPLANT
SUT ETHILON 3 0 PS 1 (SUTURE) IMPLANT
SUT ETHILON 4 0 PS 2 18 (SUTURE) IMPLANT
SUT FIBERWIRE #2 38 T-5 BLUE (SUTURE)
SUT MNCRL AB 3-0 PS2 18 (SUTURE) IMPLANT
SUT MNCRL AB 4-0 PS2 18 (SUTURE) ×2 IMPLANT
SUT VIC AB 0 CT1 27 (SUTURE)
SUT VIC AB 0 CT1 27XBRD ANBCTR (SUTURE) ×1 IMPLANT
SUT VIC AB 2-0 SH 27 (SUTURE) ×2
SUT VIC AB 2-0 SH 27XBRD (SUTURE) IMPLANT
SUT VIC AB 3-0 SH 27 (SUTURE) ×2
SUT VIC AB 3-0 SH 27X BRD (SUTURE) IMPLANT
SUTURE FIBERWR #2 38 T-5 BLUE (SUTURE) IMPLANT
SUTURE TAPE 1.3 FIBERLOP 20 ST (SUTURE) IMPLANT
SUTURETAPE 1.3 FIBERLOOP 20 ST (SUTURE) ×4
SYR 5ML LL (SYRINGE) ×1 IMPLANT
SYR 5ML LUER SLIP (SYRINGE) ×1 IMPLANT
SYS INTERNAL BRACE KNEE (Miscellaneous) ×2 IMPLANT
SYSTEM INTERNAL BRACE KNEE (Miscellaneous) IMPLANT
TAPE CLOTH 3X10 TAN LF (GAUZE/BANDAGES/DRESSINGS) IMPLANT
TENDON ANTERIOR TIBIALIS (Tissue) ×2 IMPLANT
TOWEL GREEN STERILE FF (TOWEL DISPOSABLE) ×4 IMPLANT
TUBE SUCTION HIGH CAP CLEAR NV (SUCTIONS) ×2 IMPLANT
TUBING ARTHROSCOPY IRRIG 16FT (MISCELLANEOUS) ×2 IMPLANT
WATER STERILE IRR 1000ML POUR (IV SOLUTION) ×1 IMPLANT

## 2019-06-06 NOTE — Transfer of Care (Signed)
Immediate Anesthesia Transfer of Care Note  Patient: Patricia Ferrell  Procedure(s) Performed: KNEE ARTHROSCOPY WITH LATERAL MENISECTOMY (Right Knee) KNEE ARTHROSCOPY WITH MEDIAL COLLATERAL LIGAMENT RECONSTRUCTION (Right Knee) KNEE ARTHROSCOPY WITH CHONDROPLASTY (Right Knee)  Patient Location: PACU  Anesthesia Type:General and Regional  Level of Consciousness: awake, alert  and patient cooperative  Airway & Oxygen Therapy: Patient Spontanous Breathing and Patient connected to face mask oxygen  Post-op Assessment: Report given to RN, Post -op Vital signs reviewed and stable, Patient moving all extremities X 4 and Patient able to stick tongue midline  Post vital signs: Reviewed and stable  Last Vitals:  Vitals Value Taken Time  BP 151/92 06/06/19 0941  Temp    Pulse 83 06/06/19 0944  Resp 18 06/06/19 0944  SpO2 100 % 06/06/19 0944  Vitals shown include unvalidated device data.  Last Pain:  Vitals:   06/06/19 0744  TempSrc: Oral  PainSc: 0-No pain         Complications: No apparent anesthesia complications

## 2019-06-06 NOTE — Progress Notes (Signed)
Assisted Dr Greenwith right, ultrasound guided, adductor canal block. Side rails up, monitors on throughout procedure. See vital signs in flow sheet. Tolerated Procedure well.  

## 2019-06-06 NOTE — Op Note (Signed)
Orthopaedic Surgery Operative Note (CSN: [id number])  Patricia Ferrell  Aug 01, 1955 Date of Surgery: 06/06/2019   Diagnoses:  Right MCL injury with lateral meniscus tear  Procedure: Right open MCL repair Right MCL reconstruction Right arthroscopic lateral meniscectomy and chondroplasty   Operative Finding Exam under anesthesia: Full motion no limitation with clear instability to a valgus stress compared to contralateral side.  Lachman appeared normal. Suprapatellar pouch: Normal Patellofemoral Compartment: Normal Medial Compartment: Mild grade 2 changes scattered, meniscus was appropriate, positive drive-through sign Lateral Compartment: Anterior 50% of the meniscus macerated and some mild grade 1 changes tibia, otherwise normal.  Debrided back to stable base. Intercondylar Notch: Normal  Successful completion of the planned procedure.  Overall very happy with her reconstruction she had only a slight jog with varus valgus stress which is very similar to her contralateral side.  We think she will be nonweightbearing this week and then transition to weightbearing in a unlocked brace next week.  This will be determined by if she can be well braced in a [place].  Post-operative plan: The patient will be touchdown till next week.  The patient will be discharged home.  DVT prophylaxis Aspirin 81 mg twice daily for 6 weeks.  Pain control with PRN pain medication preferring oral medicines.  Follow up plan will be scheduled in approximately 7 days for incision check and XR.  Post-Op Diagnosis: Same Surgeons:Primary: Bjorn Pippin, MD Assistants:Caroline McBane PA-C Location: [facility] OR ROOM 6 Anesthesia: General with abductor canal Antibiotics: Ancef 2 g with local vancomycin powder 1 g at the surgical site Tourniquet time: None Estimated Blood Loss: Minimal Complications: None Specimens: None Implants: Implant Name Type Inv. Item Serial No. Manufacturer Lot No. LRB No. Used Action  TENDON  ANTERIOR TIBIALIS - [id number] Tissue TENDON ANTERIOR TIBIALIS [id number] [employer] [id number] Right 1 Implanted  SYS INTERNAL BRACE KNEE - [id number] Miscellaneous SYS INTERNAL BRACE KNEE  [employer] [id number] Right 1 Implanted    Indications for Surgery:   Patricia Ferrell is a 64 y.o. female with injury remotely with MCL injury as well as lateral meniscus tear and mechanical symptoms.  We attempted bracing for an extended period time however the patient was not able to tolerate this and continued instability.  Benefits and risks of operative and nonoperative management were discussed prior to surgery with patient/guardian(s) and informed consent form was completed.  Specific risks including infection, need for additional surgery, continued instability, need for arthroplasty, arthrosis.   Procedure:   The patient was identified properly. Informed consent was obtained and the surgical site was marked. The patient was taken up to suite where general anesthesia was induced. The patient was placed in the supine position with a post against the surgical leg and a nonsterile tourniquet applied. The surgical leg was then prepped and draped usual sterile fashion.  A standard surgical timeout was performed.  2 standard anterior portals were made and diagnostic arthroscopy performed. Please note the findings as noted above.  We began with diagnostic arthroscopy as above.  We performed a lateral partial meniscectomy and chondroplasty with a combination of shaver and a basket.  At this point we turned our attention to the open portion of the case.  We began by using palpable landmarks to make 2 small 2 cm incisions one just proximal and posterior to the medial epicondyle and one just distal to the pes tendons.  We went through skin sharply achieving hemostasis we progressed to protect neurovascular structures.  At  that point we were able to clear soft tissue in place to Beath pins and  checked isometry.  We had essentially no change in length with our isometry chest and were happy with our tunnel placement.  We reamed a 20 mm blind tunnel in the femur and used a 4.75 mm swivel lock anchor to dunk a tibialis anterior graft which was whipstitched.  6 mm tunnel reamed.  We additionally placed a fiber tape.  We used the safety stitch from this anchor to reef the native MCL and repair it.  We then tunneled the graft and the sutures to the inferior incision.  We similarly drilled a unicortical tunnel and passed our suture using the Beath needle through the lateral cortex.  We able to whipstitch the insitu to the appropriate length and drilled a 7 mm tunnel and properly tensioned on the graft.  We additionally placed the fiber tape for an internal brace construct.  We then fixed this with a 4.75 mm swivel lock.  Checked the knee at the end of the case had good stability.  Good range of motion.  Local vancomycin powder was placed after irrigating copiously.   Incisions closed with absorbable suture. The patient was awoken from general anesthesia and taken to the PACU in stable condition without complication.   Patricia Chapel, PA-C, present and scrubbed throughout the case, critical for completion in a timely fashion, and for retraction, instrumentation, closure.

## 2019-06-06 NOTE — Discharge Instructions (Signed)

## 2019-06-06 NOTE — Interval H&P Note (Signed)
History and Physical Interval Note:  06/06/2019 7:37 AM  Patricia Ferrell  has presented today for surgery, with the diagnosis of RIGHT KNEE CHONDROMALACIA PATELLA, LATERAL MENISCUS TEAR, CHRONIC INSTABILITY M22.41, S83.289A, M23.51.  The various methods of treatment have been discussed with the patient and family. After consideration of risks, benefits and other options for treatment, the patient has consented to  Procedure(s): KNEE ARTHROSCOPY WITH LATERAL MENISECTOMY (Right) KNEE ARTHROSCOPY WITH MEDIAL COLLATERAL LIGAMENT RECONSTRUCTION (Right) KNEE ARTHROSCOPY WITH CHONDROPLASTY (Right) as a surgical intervention.  The patient's history has been reviewed, patient examined, no change in status, stable for surgery.  I have reviewed the patient's chart and labs.  Questions were answered to the patient's satisfaction.     Bjorn Pippin

## 2019-06-06 NOTE — Anesthesia Preprocedure Evaluation (Addendum)
Anesthesia Evaluation  Patient identified by MRN, date of birth, ID band  Reviewed: Allergy & Precautions, NPO status   History of Anesthesia Complications (+) PONV  Airway Mallampati: II  TM Distance: >3 FB     Dental   Pulmonary former smoker,    breath sounds clear to auscultation       Cardiovascular negative cardio ROS   Rhythm:Regular Rate:Normal     Neuro/Psych    GI/Hepatic Neg liver ROS, hiatal hernia, GERD  ,  Endo/Other  negative endocrine ROS  Renal/GU negative Renal ROS     Musculoskeletal   Abdominal   Peds  Hematology  (+) anemia ,   Anesthesia Other Findings   Reproductive/Obstetrics                             Anesthesia Physical Anesthesia Plan  ASA: III  Anesthesia Plan: General   Post-op Pain Management:    Induction: Intravenous  PONV Risk Score and Plan: 4 or greater and Ondansetron, Dexamethasone and Midazolam  Airway Management Planned: LMA  Additional Equipment:   Intra-op Plan:   Post-operative Plan: Extubation in OR  Informed Consent: I have reviewed the patients History and Physical, chart, labs and discussed the procedure including the risks, benefits and alternatives for the proposed anesthesia with the patient or authorized representative who has indicated his/her understanding and acceptance.     Dental advisory given  Plan Discussed with: CRNA and Anesthesiologist  Anesthesia Plan Comments:         Anesthesia Quick Evaluation

## 2019-06-06 NOTE — Anesthesia Procedure Notes (Signed)
Procedure Name: LMA Insertion Date/Time: 06/06/2019 8:20 AM Performed by: Salomon Mast, CRNA Pre-anesthesia Checklist: Patient identified, Emergency Drugs available, Suction available and Patient being monitored Patient Re-evaluated:Patient Re-evaluated prior to induction Oxygen Delivery Method: Circle system utilized Preoxygenation: Pre-oxygenation with 100% oxygen Induction Type: IV induction Ventilation: Mask ventilation without difficulty LMA: LMA inserted LMA Size: 4.0 Number of attempts: 1 Placement Confirmation: positive ETCO2 and breath sounds checked- equal and bilateral Tube secured with: Tape Dental Injury: Teeth and Oropharynx as per pre-operative assessment

## 2019-06-06 NOTE — Anesthesia Procedure Notes (Addendum)
Anesthesia Regional Block: Adductor canal block   Pre-Anesthetic Checklist: ,, timeout performed, Correct Patient, Correct Site, Correct Laterality, Correct Procedure, Correct Position, site marked, Risks and benefits discussed,  Surgical consent,  Pre-op evaluation,  At surgeon's request and post-op pain management  Laterality: Right  Prep: chloraprep       Needles:  Injection technique: Single-shot  Needle Type: Echogenic Stimulator Needle          Additional Needles:   Procedures: Doppler guided, nerve stimulator,,, ultrasound used (permanent image in chart),,,,  Narrative:  Start time: 06/06/2019 7:20 AM End time: 06/06/2019 7:35 AM Injection made incrementally with aspirations every 5 mL.  Performed by: Personally  Anesthesiologist: Dorris Singh, MD

## 2019-06-09 NOTE — Anesthesia Postprocedure Evaluation (Signed)
Anesthesia Post Note  Patient: Patricia Ferrell  Procedure(s) Performed: KNEE ARTHROSCOPY WITH LATERAL MENISECTOMY (Right Knee) KNEE ARTHROSCOPY WITH MEDIAL COLLATERAL LIGAMENT RECONSTRUCTION (Right Knee) KNEE ARTHROSCOPY WITH CHONDROPLASTY (Right Knee)     Anesthesia Post Evaluation  Last Vitals:  Vitals:   06/06/19 1230 06/06/19 1241  BP:    Pulse: 72   Resp:    Temp:    SpO2: 91% 98%    Last Pain:  Vitals:   06/07/19 0937  TempSrc:   PainSc: 1                  Raistlin Gum

## 2019-06-09 NOTE — Addendum Note (Signed)
Addendum  created 06/09/19 1220 by Dorris Singh, MD   Clinical Note Signed, Intraprocedure Blocks edited

## 2019-06-10 NOTE — Addendum Note (Signed)
Addendum  created 06/10/19 1306 by Lance Coon, CRNA   Charge Capture section accepted

## 2019-06-11 ENCOUNTER — Encounter: Payer: Self-pay | Admitting: *Deleted

## 2019-11-13 ENCOUNTER — Ambulatory Visit (INDEPENDENT_AMBULATORY_CARE_PROVIDER_SITE_OTHER): Payer: 59 | Admitting: Orthopaedic Surgery

## 2019-11-13 ENCOUNTER — Encounter: Payer: Self-pay | Admitting: Orthopaedic Surgery

## 2019-11-13 ENCOUNTER — Ambulatory Visit: Payer: Self-pay

## 2019-11-13 VITALS — Ht 66.0 in | Wt 207.0 lb

## 2019-11-13 DIAGNOSIS — M25561 Pain in right knee: Secondary | ICD-10-CM | POA: Diagnosis not present

## 2019-11-13 DIAGNOSIS — G8929 Other chronic pain: Secondary | ICD-10-CM

## 2019-11-13 MED ORDER — GABAPENTIN 100 MG PO CAPS: 100.0000 mg | ORAL_CAPSULE | Freq: Three times a day (TID) | ORAL | 3 refills | Status: DC

## 2019-11-13 NOTE — Progress Notes (Signed)
Office Visit Note   Patient: Patricia Ferrell           Date of Birth: [birth year]           MRN: [id number] Visit Date: 11/13/2019              Requested by: No referring provider defined for this encounter. PCP: Patricia Ferrell   Assessment & Plan: Visit Diagnoses:  1. Chronic pain of right knee     Plan: Impression is chronic right knee pain.  I think her pain is multifactorial.  I think she has some neuropathic pain that is possibly from neuroma from the surgery so we will try some gabapentin to see if this will help.  I do think she has evidence of arthritis pain as well.  After discussion of options we repeated the cortisone injection today through superior lateral approach.  Information was also given for Visco injection.  Questions encouraged and answered.  Follow-Up Instructions: Return if symptoms worsen or fail to improve.   Orders:  Orders Placed This Encounter  Procedures  . XR KNEE 3 VIEW RIGHT   Meds ordered this encounter  Medications  . gabapentin (NEURONTIN) 100 MG capsule    Sig: Take 1-3 capsules (100-300 mg total) by mouth 3 (three) times daily.    Dispense:  30 capsule    Refill:  3      Procedures: No procedures performed   Clinical Data: No additional findings.   Subjective: Chief Complaint  Patient presents with  . Right Knee - Pain    Patricia Ferrell is a 64 year old female comes in for evaluation of chronic right knee pain.  She underwent partial lateral meniscectomy and MCL repair and reconstruction with Dr. Everardo Pacific about 5 months ago after an injury.  She states that she has had continued right knee pain which a knee brace helps for support.  She states that she has some pain from the femoral incision and is tender to palpation with some numbing and burning pain she also has increased pain with aching with increased walking that knee brace helps with.  Denies any mechanical symptoms.  Denies any injuries after the surgery.   Review of  Systems  Constitutional: Negative.   HENT: Negative.   Eyes: Negative.   Respiratory: Negative.   Cardiovascular: Negative.   Endocrine: Negative.   Musculoskeletal: Negative.   Neurological: Negative.   Hematological: Negative.   Psychiatric/Behavioral: Negative.   All other systems reviewed and are negative.    Objective: Vital Signs: Ht 5\' 6"  (1.676 m)   Wt 207 lb (93.9 kg)   BMI 33.41 kg/m   Physical Exam Vitals and nursing note reviewed.  Constitutional:      Appearance: She is well-developed.  HENT:     Head: Normocephalic and atraumatic.  Pulmonary:     Effort: Pulmonary effort is normal.  Abdominal:     Palpations: Abdomen is soft.  Musculoskeletal:     Cervical back: Neck supple.  Skin:    General: Skin is warm.     Capillary Refill: Capillary refill takes less than 2 seconds.  Neurological:     Mental Status: She is alert and oriented to person, place, and time.  Psychiatric:        Behavior: Behavior normal.        Thought Content: Thought content normal.        Judgment: Judgment normal.     Ortho Exam Right knee shows well-healed postsurgical scars.  She has excellent range of motion.  Collaterals and cruciates are stable.  She is slightly tender to palpation over the surgical scars on the medial side.  Trace joint effusion.  No joint line tenderness.  No patellofemoral crepitus.  Specialty Comments:  No specialty comments available.  Imaging: XR KNEE 3 VIEW RIGHT  Result Date: 11/13/2019 No acute or structural abnormalities.  Mild periarticular spurring of the lateral compartment.    PMFS History: Patient Active Problem List   Diagnosis Date Noted  . Seborrheic keratosis, inflamed 05/02/2017  . HNP (herniated nucleus pulposus), lumbar 09/16/2015  . Radicular pain of right lower extremity 07/24/2015  . Calculus of gallbladder with acute cholecystitis without obstruction 06/20/2014  . Abdominal pain 06/17/2014  . Diarrhea 06/17/2014  .  Allergic rhinitis 09/08/2008  . Anxiety disorder, unspecified 09/02/2008  . HYPERLIPIDEMIA 02/18/2008  . 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 PERIMENOPAUSAL SYNDROME 02/06/2008  . CARPAL TUNNEL SYNDROME, BILATERAL, HX OF 02/06/2008  . ASTHMA 08/21/2007  . SEBORRHEIC KERATOSIS 08/21/2007  . DISC DISEASE, LUMBAR 07/25/2007  . EXTRINSIC ASTHMA, UNSPECIFIED 05/04/2007   Past Medical History:  Diagnosis Date  . Anemia    years ago  . Anxiety   . Arthritis   . Asthma    bronchitis  . Back pain    lumbar  . Carpal tunnel syndrome   . Complication of anesthesia   . Family history of adverse reaction to anesthesia    mother's blood pressure always drops   . GERD (gastroesophageal reflux disease)   . History of hiatal hernia   . Hyperlipidemia   . Perimenopausal   . PONV (postoperative nausea and vomiting)     Family History  Problem Relation Age of Onset  . Coronary artery disease Mother   . Heart attack Mother   . Hypertension Mother   . Colon polyps Father     Past Surgical History:  Procedure Laterality Date  . ABDOMINAL HYSTERECTOMY     DUB  . CHOLECYSTECTOMY  06/19/2014  . CHOLECYSTECTOMY N/A 06/19/2014   Procedure: LAPAROSCOPIC CHOLECYSTECTOMY WITH INTRAOPERATIVE CHOLANGIOGRAM;  Surgeon: Manus Rudd, MD;  [facility];  Service: General;  Laterality: N/A;  . CHONDROPLASTY Right 06/06/2019   Procedure: KNEE ARTHROSCOPY WITH CHONDROPLASTY;  Surgeon: Bjorn Pippin, MD;  Location: [facility];  Service: Orthopedics;  Laterality: Right;  . COLONOSCOPY    . KNEE ARTHROSCOPY WITH LATERAL MENISECTOMY Right 06/06/2019   Procedure: KNEE ARTHROSCOPY WITH LATERAL MENISECTOMY;  Surgeon: Bjorn Pippin, MD;  Location: [facility];  Service: Orthopedics;  Laterality: Right;  . KNEE ARTHROSCOPY WITH MEDIAL COLLATERAL LIGAMENT RECONSTRUCTION Right 06/06/2019   Procedure: KNEE ARTHROSCOPY WITH MEDIAL COLLATERAL LIGAMENT RECONSTRUCTION;  Surgeon: Bjorn Pippin, MD;  Location: [facility];  Service: Orthopedics;  Laterality: Right;  . LUMBAR LAMINECTOMY/DECOMPRESSION MICRODISCECTOMY Right 09/16/2015   Procedure: Right - Lumbar five-sacral one  lumbar laminotomy and microdiscectomy;  Surgeon: Shirlean Kelly, MD;  Location: [facility];  Service: Neurosurgery;  Laterality: Right;  . OOPHORECTOMY  02/21/94   left   Social History   Occupational History  . Occupation: special needs care  Tobacco Use  . Smoking status: Former Smoker    Quit date: 02/21/1988    Years since quitting: 31.7  . Smokeless tobacco: Never Used  Vaping Use  . Vaping Use: Never used  Substance and Sexual Activity  . Alcohol use: Yes    Alcohol/week: 0.0 standard drinks    Comment: occasional  .  Drug use: No  . Sexual activity: Never

## 2020-02-06 LAB — HM MAMMOGRAPHY

## 2020-02-17 ENCOUNTER — Encounter: Payer: Self-pay | Admitting: Emergency Medicine

## 2020-02-17 ENCOUNTER — Ambulatory Visit
Admission: EM | Admit: 2020-02-17 | Discharge: 2020-02-17 | Disposition: A | Discharge status: Home / Self Care | Payer: 59 | Attending: Emergency Medicine | Admitting: Emergency Medicine

## 2020-02-17 ENCOUNTER — Other Ambulatory Visit: Payer: Self-pay

## 2020-02-17 DIAGNOSIS — K219 Gastro-esophageal reflux disease without esophagitis: Secondary | ICD-10-CM

## 2020-02-17 DIAGNOSIS — F419 Anxiety disorder, unspecified: Secondary | ICD-10-CM | POA: Diagnosis not present

## 2020-02-17 DIAGNOSIS — R079 Chest pain, unspecified: Secondary | ICD-10-CM

## 2020-02-17 DIAGNOSIS — Z6379 Other stressful life events affecting family and household: Secondary | ICD-10-CM

## 2020-02-17 DIAGNOSIS — Z636 Dependent relative needing care at home: Secondary | ICD-10-CM

## 2020-02-17 MED ORDER — FAMOTIDINE 20 MG PO TABS: 20.0000 mg | ORAL_TABLET | Freq: Two times a day (BID) | ORAL | 0 refills | Status: DC

## 2020-02-17 NOTE — ED Triage Notes (Signed)
Patient states she had sharp pain last night in the center of her chest and it is still there this morning but very mild and feels like heaviness. Patient is diagnosed with acid reflux but does not take her meds. Pt is also diagnosed with anxiety and has stopped taking her meds for that. PT is aox4 and ambulatory.

## 2020-02-17 NOTE — Discharge Instructions (Addendum)
EKG looks stable today. Important to take your reflux medications daily. Follow-up with your primary care physician for further evaluation management. Go to the ER if you develop worsening chest pain, difficulty breathing, nausea, or vomiting, weakness or dizziness.

## 2020-02-17 NOTE — ED Provider Notes (Signed)
[facility]    CSN: [id number] Arrival date & time: 02/17/20  0802      History   Chief Complaint Chief Complaint  Patient presents with  . Chest Pain    Last night    HPI Patricia Ferrell is a 64 y.o. female  With history as below presenting for sternal chest pain since last night.  Pain is described as sharp, nonradiating.  States severity has improved, though still present.  Also feels heaviness.  Reports noncompliance with GERD medications.  Patient also endorsing history of anxiety: Denies SI/HI.  States that her brother, who lives in [place], has history of glioblastoma and was put on hospice care the other day.  Patient is also caregiver for her parents.  Past Medical History:  Diagnosis Date  . Anemia    years ago  . Anxiety   . Arthritis   . Asthma    bronchitis  . Back pain    lumbar  . Carpal tunnel syndrome   . Complication of anesthesia   . Family history of adverse reaction to anesthesia    mother's blood pressure always drops   . GERD (gastroesophageal reflux disease)   . History of hiatal hernia   . Hyperlipidemia   . Perimenopausal   . PONV (postoperative nausea and vomiting)     Patient Active Problem List   Diagnosis Date Noted  . Seborrheic keratosis, inflamed 05/02/2017  . HNP (herniated nucleus pulposus), lumbar 09/16/2015  . Radicular pain of right lower extremity 07/24/2015  . Calculus of gallbladder with acute cholecystitis without obstruction 06/20/2014  . Abdominal pain 06/17/2014  . Diarrhea 06/17/2014  . Allergic rhinitis 09/08/2008  . Anxiety disorder, unspecified 09/02/2008  . HYPERLIPIDEMIA 02/18/2008  . 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 PERIMENOPAUSAL SYNDROME 02/06/2008  . CARPAL TUNNEL SYNDROME, BILATERAL, HX OF 02/06/2008  . ASTHMA 08/21/2007  . SEBORRHEIC KERATOSIS 08/21/2007  . DISC DISEASE, LUMBAR 07/25/2007  . EXTRINSIC ASTHMA, UNSPECIFIED 05/04/2007    Past Surgical History:  Procedure  Laterality Date  . ABDOMINAL HYSTERECTOMY     DUB  . CHOLECYSTECTOMY  06/19/2014  . CHOLECYSTECTOMY N/A 06/19/2014   Procedure: LAPAROSCOPIC CHOLECYSTECTOMY WITH INTRAOPERATIVE CHOLANGIOGRAM;  Surgeon: Manus Rudd, MD;  Location: [facility] OR;  Service: General;  Laterality: N/A;  . CHONDROPLASTY Right 06/06/2019   Procedure: KNEE ARTHROSCOPY WITH CHONDROPLASTY;  Surgeon: Bjorn Pippin, MD;  Location: [facility];  Service: Orthopedics;  Laterality: Right;  . COLONOSCOPY    . KNEE ARTHROSCOPY WITH LATERAL MENISECTOMY Right 06/06/2019   Procedure: KNEE ARTHROSCOPY WITH LATERAL MENISECTOMY;  Surgeon: Bjorn Pippin, MD;  Location: [facility];  Service: Orthopedics;  Laterality: Right;  . KNEE ARTHROSCOPY WITH MEDIAL COLLATERAL LIGAMENT RECONSTRUCTION Right 06/06/2019   Procedure: KNEE ARTHROSCOPY WITH MEDIAL COLLATERAL LIGAMENT RECONSTRUCTION;  Surgeon: Bjorn Pippin, MD;  Location:  SURGERY CENTER;  Service: Orthopedics;  Laterality: Right;  . LUMBAR LAMINECTOMY/DECOMPRESSION MICRODISCECTOMY Right 09/16/2015   Procedure: Right - Lumbar five-sacral one  lumbar laminotomy and microdiscectomy;  Surgeon: Shirlean Kelly, MD;  Location: [facility];  Service: Neurosurgery;  Laterality: Right;  . OOPHORECTOMY  02/21/94   left    OB History   No obstetric history on file.      Home Medications    Prior to Admission medications   Medication Sig Start Date End Date Taking? Authorizing Provider  famotidine (PEPCID) 20 MG tablet Take 1 tablet (20 mg total) by mouth 2 (two) times daily. 02/17/20  Hall-Potvin, [place], PA-C  PARoxetine (PAXIL) 20 MG tablet Take 20 mg by mouth daily.    [provider]  traMADol (ULTRAM) 50 MG tablet Take 50 mg by mouth every 6 (six) hours as needed. 06/05/19   [provider]  gabapentin (NEURONTIN) 100 MG capsule Take 1-3 capsules (100-300 mg total) by mouth 3 (three) times daily. 11/13/19 02/17/20  Tarry Kos, MD     Family History Family History  Problem Relation Age of Onset  . Coronary artery disease Mother   . Heart attack Mother   . Hypertension Mother   . Colon polyps Father     Social History Social History   Tobacco Use  . Smoking status: Former Smoker    Quit date: 02/21/1988    Years since quitting: 32.0  . Smokeless tobacco: Never Used  Vaping Use  . Vaping Use: Never used  Substance Use Topics  . Alcohol use: Yes    Alcohol/week: 0.0 standard drinks    Comment: occasional  . Drug use: No     Allergies   Hydrocodone, Oxycodone, and Codeine   Review of Systems Review of Systems  Constitutional: Negative for fatigue and fever.  HENT: Negative for ear pain, sinus pain, sore throat and voice change.   Eyes: Negative for pain, redness and visual disturbance.  Respiratory: Negative for cough and shortness of breath.   Cardiovascular: Positive for chest pain. Negative for palpitations.  Gastrointestinal: Negative for abdominal pain, diarrhea and vomiting.  Musculoskeletal: Negative for arthralgias and myalgias.  Skin: Negative for rash and wound.  Neurological: Negative for syncope and headaches.     Physical Exam Triage Vital Signs ED Triage Vitals  Enc Vitals Group     BP 02/17/20 0819 121/87     Pulse Rate 02/17/20 0819 84     Resp 02/17/20 0819 18     Temp 02/17/20 0819 98.1 F (36.7 C)     Temp Source 02/17/20 0819 Oral     SpO2 02/17/20 0819 97 %     Weight --      Height --      Head Circumference --      Peak Flow --      Pain Score 02/17/20 0820 3     Pain Loc --      Pain Edu? --      Excl. in GC? --    No data found.  Updated Vital Signs BP 121/87 (BP Location: Left Arm)   Pulse 84   Temp 98.1 F (36.7 C) (Oral)   Resp 18   SpO2 97%   Visual Acuity Right Eye Distance:   Left Eye Distance:   Bilateral Distance:    Right Eye Near:   Left Eye Near:    Bilateral Near:     Physical Exam Constitutional:      General: She is not  in acute distress.    Appearance: She is not ill-appearing or diaphoretic.  HENT:     Head: Normocephalic and atraumatic.     Right Ear: Tympanic membrane and ear canal normal.     Left Ear: Tympanic membrane and ear canal normal.     Mouth/Throat:     Mouth: Mucous membranes are moist.     Pharynx: Oropharynx is clear. No oropharyngeal exudate or posterior oropharyngeal erythema.  Eyes:     General: No scleral icterus.    Conjunctiva/sclera: Conjunctivae normal.     Pupils: Pupils are equal, round, and reactive to light.  Neck:     Comments: Trachea midline, negative JVD Cardiovascular:     Rate and Rhythm: Normal rate and regular rhythm.     Heart sounds: No murmur heard. No gallop.   Pulmonary:     Effort: Pulmonary effort is normal. No respiratory distress.     Breath sounds: No wheezing, rhonchi or rales.  Abdominal:     Palpations: Abdomen is soft.     Tenderness: There is no abdominal tenderness.  Musculoskeletal:     Cervical back: Neck supple. No tenderness.  Lymphadenopathy:     Cervical: No cervical adenopathy.  Skin:    Capillary Refill: Capillary refill takes less than 2 seconds.     Coloration: Skin is not jaundiced or pale.     Findings: No rash.  Neurological:     General: No focal deficit present.     Mental Status: She is alert and oriented to person, place, and time.      UC Treatments / Results  Labs (all labs ordered are listed, but only abnormal results are displayed) Labs Reviewed - No data to display  EKG   Radiology No results found.  Procedures Procedures (including critical care time)  Medications Ordered in UC Medications - No data to display  Initial Impression / Assessment and Plan / UC Course  I have reviewed the triage vital signs and the nursing notes.  Pertinent labs & imaging results that were available during my care of the patient were reviewed by me and considered in my medical decision making (see chart for  details).     Patient febrile, nontoxic, and hemodynamically stable in office.  EKG done office, reviewed by me and compared to previous from 06/17/2014: NSR with ventricular at 77 bpm.  No QTC prolongation, ST elevation or depression.  Waveforms stable in all leads-nonacute.  Likely GERD with anxiety component.  Will resume Final Clinical Impressions(s) / UC Diagnoses   Final diagnoses:  Chest pain, unspecified type  Gastroesophageal reflux disease, unspecified whether esophagitis present  Caregiver stress  Stress due to illness of family member     Discharge Instructions     EKG looks stable today. Important to take your reflux medications daily. Follow-up with your primary care physician for further evaluation management. Go to the ER if you develop worsening chest pain, difficulty breathing, nausea, or vomiting, weakness or dizziness.    ED Prescriptions    Medication Sig Dispense Auth. Provider   famotidine (PEPCID) 20 MG tablet Take 1 tablet (20 mg total) by mouth 2 (two) times daily. 60 tablet Hall-Potvin, [place], PA-C     PDMP not reviewed this encounter.   Odette Fraction [place], [place] 02/17/20 [phone]

## 2020-03-05 ENCOUNTER — Other Ambulatory Visit: Payer: Self-pay

## 2020-03-05 ENCOUNTER — Other Ambulatory Visit: Payer: 59 | Admitting: Internal Medicine

## 2020-03-05 DIAGNOSIS — Z1329 Encounter for screening for other suspected endocrine disorder: Secondary | ICD-10-CM

## 2020-03-05 DIAGNOSIS — Z1321 Encounter for screening for nutritional disorder: Secondary | ICD-10-CM

## 2020-03-05 DIAGNOSIS — Z13220 Encounter for screening for lipoid disorders: Secondary | ICD-10-CM

## 2020-03-05 DIAGNOSIS — Z0000 Encounter for general adult medical examination without abnormal findings: Secondary | ICD-10-CM

## 2020-03-06 LAB — CBC WITH DIFFERENTIAL/PLATELET
Absolute Monocytes: 453 cells/uL (ref 200–950)
Basophils Absolute: 62 cells/uL (ref 0–200)
Basophils Relative: 1 %
Eosinophils Absolute: 260 cells/uL (ref 15–500)
Eosinophils Relative: 4.2 %
HCT: 41.7 % (ref 35.0–45.0)
Hemoglobin: 13.8 g/dL (ref 11.7–15.5)
Lymphs Abs: 2740 cells/uL (ref 850–3900)
MCH: 28.5 pg (ref 27.0–33.0)
MCHC: 33.1 g/dL (ref 32.0–36.0)
MCV: 86 fL (ref 80.0–100.0)
MPV: 9.9 fL (ref 7.5–12.5)
Monocytes Relative: 7.3 %
Neutro Abs: 2685 cells/uL (ref 1500–7800)
Neutrophils Relative %: 43.3 %
Platelets: 229 10*3/uL (ref 140–400)
RBC: 4.85 10*6/uL (ref 3.80–5.10)
RDW: 13.3 % (ref 11.0–15.0)
Total Lymphocyte: 44.2 %
WBC: 6.2 10*3/uL (ref 3.8–10.8)

## 2020-03-06 LAB — COMPLETE METABOLIC PANEL WITH GFR
AG Ratio: 1.7 (calc) (ref 1.0–2.5)
ALT: 20 U/L (ref 6–29)
AST: 17 U/L (ref 10–35)
Albumin: 4.1 g/dL (ref 3.6–5.1)
Alkaline phosphatase (APISO): 78 U/L (ref 37–153)
BUN: 16 mg/dL (ref 7–25)
CO2: 25 mmol/L (ref 20–32)
Calcium: 9.1 mg/dL (ref 8.6–10.4)
Chloride: 105 mmol/L (ref 98–110)
Creat: 0.88 mg/dL (ref 0.50–0.99)
GFR, Est African American: 82 mL/min/{1.73_m2} (ref >=60)
GFR, Est Non African American: 70 mL/min/{1.73_m2} (ref >=60)
Globulin: 2.4 g/dL (calc) (ref 1.9–3.7)
Glucose, Bld: 97 mg/dL (ref 65–99)
Potassium: 3.8 mmol/L (ref 3.5–5.3)
Sodium: 139 mmol/L (ref 135–146)
Total Bilirubin: 0.3 mg/dL (ref 0.2–1.2)
Total Protein: 6.5 g/dL (ref 6.1–8.1)

## 2020-03-06 LAB — VITAMIN D 25 HYDROXY (VIT D DEFICIENCY, FRACTURES): Vit D, 25-Hydroxy: 21 ng/mL — ABNORMAL LOW (ref 30–100)

## 2020-03-06 LAB — LIPID PANEL
Cholesterol: 219 mg/dL — ABNORMAL HIGH (ref <200)
HDL: 39 mg/dL — ABNORMAL LOW (ref >=50)
LDL Cholesterol (Calc): 148 mg/dL (calc) — ABNORMAL HIGH
Non-HDL Cholesterol (Calc): 180 mg/dL (calc) — ABNORMAL HIGH (ref <130)
Total CHOL/HDL Ratio: 5.6 (calc) — ABNORMAL HIGH (ref <5.0)
Triglycerides: 188 mg/dL — ABNORMAL HIGH (ref <150)

## 2020-03-06 LAB — TSH: TSH: 1.78 mIU/L (ref 0.40–4.50)

## 2020-03-12 ENCOUNTER — Ambulatory Visit: Payer: 59 | Admitting: Internal Medicine

## 2020-03-12 ENCOUNTER — Other Ambulatory Visit: Payer: Self-pay

## 2020-03-12 ENCOUNTER — Encounter: Payer: Self-pay | Admitting: Internal Medicine

## 2020-03-12 VITALS — BP 110/70 | HR 100 | Ht 66.0 in | Wt 197.0 lb

## 2020-03-12 DIAGNOSIS — E559 Vitamin D deficiency, unspecified: Secondary | ICD-10-CM | POA: Diagnosis not present

## 2020-03-12 DIAGNOSIS — K219 Gastro-esophageal reflux disease without esophagitis: Secondary | ICD-10-CM | POA: Diagnosis not present

## 2020-03-12 DIAGNOSIS — Z0000 Encounter for general adult medical examination without abnormal findings: Secondary | ICD-10-CM | POA: Diagnosis not present

## 2020-03-12 DIAGNOSIS — E786 Lipoprotein deficiency: Secondary | ICD-10-CM

## 2020-03-12 DIAGNOSIS — E782 Mixed hyperlipidemia: Secondary | ICD-10-CM | POA: Diagnosis not present

## 2020-03-12 DIAGNOSIS — F32A Depression, unspecified: Secondary | ICD-10-CM

## 2020-03-12 DIAGNOSIS — F419 Anxiety disorder, unspecified: Secondary | ICD-10-CM | POA: Diagnosis not present

## 2020-03-12 DIAGNOSIS — M7918 Myalgia, other site: Secondary | ICD-10-CM

## 2020-03-12 DIAGNOSIS — M8580 Other specified disorders of bone density and structure, unspecified site: Secondary | ICD-10-CM

## 2020-03-12 LAB — POCT URINALYSIS DIPSTICK
Appearance: NEGATIVE
Bilirubin, UA: NEGATIVE
Blood, UA: NEGATIVE
Glucose, UA: NEGATIVE
Ketones, UA: NEGATIVE
Leukocytes, UA: NEGATIVE
Nitrite, UA: NEGATIVE
Odor: NEGATIVE
Protein, UA: NEGATIVE
Spec Grav, UA: 1.015 (ref 1.010–1.025)
Urobilinogen, UA: 0.2 E.U./dL
pH, UA: 6.5 (ref 5.0–8.0)

## 2020-03-12 NOTE — Progress Notes (Signed)
Subjective:    Patient ID: Patricia Ferrell, female    DOB: [birth year], 65 y.o.   MRN: [id number]  HPI 65 year old Female presents for the first time to this office for health maintenance exam and evaluation of medical issues.  Saw Dr. Everlena Cooper for right facial numbness 2020 which resolved.Was checked for Lyme disease, lupus. Tests were negative and issue resolved in about 30 days.  GYN is Dr. Mitchel Honour  December 2021 who did mammogram in her office as well as bone density. Hx of osteopenia Had most recent bone density 2021 but does not know results and will call.  Had Endoscopy and colonoscopy in 2011 by Dr. Rosine Beat at [facility]. Last GI procedure was done by Dr. Loreta Ave about 3 years ago.  Her former primary care physician was Dr. Alonza Smoker who has retired.  He removed 2 irritated seborrheic keratoses left anterior chest wall in March 2019.  Her recent lab work is reviewed.  CBC was normal glucose was normal at 97.  BUN and creatinine were normal.  Liver functions were normal.  Sodium and potassium were normal.  However she had total cholesterol of 219, HDL low at 39, triglycerides 188 and LDL cholesterol of 148.  TSH is within normal limits.  Vitamin D level is low at 21.  She has depression symptoms.   Family history: History of hypertension osteoporosis heart disease thyroid disorder and lung tumor as well as COPD in mother.  Father with history of hypertension heart disease and arthritis.  Dr. Loreta Ave did colonoscopy November 21, 2017.  Patient last had mammogram 02/06/2020.  Had Pap smear November 07, 2017 with repeat recommended in 2022.  She is status post hysterectomy and history of ovarian cyst.  Is taking Effexor prescribed by GYN for depression but had trouble remembering it daily and would have episodes of vertigo if it was forgotten.   Had bone density in 2019 with  T score -1.2 in the right femoral neck and -1.3 in the left femoral neck.  AP spine score was -1.0.   Diagnosed with osteopenia.  Patient says that she had hysterectomy and left oophorectomy in 2008 for endometriosis.  Says she had right oophorectomy and right fallopian tube removed at age 32.  Had knee surgery right knee by Dr. Ronnald Nian  History of back surgery 2017 by Dr. Lonni Fix and cholecystectomy 2016.  No history of motor vehicle accidents.  Intolerant of codeine.  Currently taking as needed Voltaren generic and Pepcid for GE reflux.  Had [employer] COVID-19 vaccine in April 2021.  Does not recall date of last tetanus immunization.  Last mammogram was in December 2021 at GYN office and was normal.  GYN records indicate patient has history of condyloma and osteopenia.  History of anxiety and depression treated with Effexor.  History of childbirth (vaginal delivery) 1980  Left ovary and fallopian tube removed 1994  Complete hysterectomy 2009 or 2010  Gallbladder removal 2016  Back surgery July 2017  Intolerant of codeine  History of migraine headaches  Review of Systems Hx right foot numbness      Objective:   Physical Exam Blood pressure 110/70 pulse 100 pulse oximetry 98% weight 197 pounds height 5 feet 6 inches BMI 31.80  Skin warm and dry.  Nodes none.  Chest clear.  TMs clear.  Cardiac exam regular rate and rhythm normal S1 and S2.  Breast without masses.  Abdomen soft nondistended without hepatosplenomegaly masses or tenderness.  GYN exam deferred  to Dr. Langston Masker.  No lower extremity pitting edema.  Neuro intact without focal deficits.  Affect thought and judgment appear to be normal.       Assessment & Plan:  Anxiety depression.  Patient requests Valium to take on a as needed basis.  Explained I did not want her to take it regularly.  Gave her #30 with no refill.  Did not like taking Effexor because she could not remember it sometimes and would have side effects if she missed a dose  Musculoskeletal pain treated with Voltaren 75 mg daily.  This was  refilled.  Vitamin D deficiency-we will take Drisdol 50,000 units weekly for 12 weeks and then will take over-the-counter vitamin D 5000 units daily  History of GE reflux treated with Pepcid 20 mg twice daily.  History of osteopenia-bone density done by GYN see above  Mixed hyperlipidemia-does not want to be on statin therapy  Low HDL of 39  History of right L5-S1 laminotomy and microdiscectomy  for HNP by Dr. Newell Coral in 2017  Plan: Would like to see the patient in 6 months.  She prefers to return in 1 year.  She will continue to be seen by GYN physician who will do mammogram and bone density studies.  She has had colonoscopy by Dr. Loreta Ave but not sure when date of last colonoscopy was done.  She had J&J vaccine April 2021.  Needs to get booster for COVID-19.  Last tetanus immunization on file was in 2009 in [facility]

## 2020-03-13 ENCOUNTER — Ambulatory Visit: Payer: Self-pay | Admitting: Internal Medicine

## 2020-03-13 MED ORDER — DIAZEPAM 5 MG PO TABS: 5.0000 mg | ORAL_TABLET | Freq: Four times a day (QID) | ORAL | 0 refills | Status: DC | PRN

## 2020-03-13 MED ORDER — ERGOCALCIFEROL 1.25 MG (50000 UT) PO CAPS: 50000.0000 [IU] | ORAL_CAPSULE | ORAL | 0 refills | Status: DC

## 2020-03-13 MED ORDER — FAMOTIDINE 20 MG PO TABS: 20.0000 mg | ORAL_TABLET | Freq: Two times a day (BID) | ORAL | 5 refills | Status: DC

## 2020-04-20 ENCOUNTER — Encounter: Payer: Self-pay | Admitting: Internal Medicine

## 2020-04-20 NOTE — Patient Instructions (Addendum)
Take Valium sparingly for anxiety.  Voltaren refilled today for musculoskeletal pain.  Take high-dose vitamin D 50,000 units weekly for 12 weeks then take over-the-counter vitamin D.  May continue Pepcid for GE reflux.  GYN will handle bone density treatment.  I am concerned about your mixed hyperlipidemia.  Patient declines statin therapy today.  Please have Covid booster.  Tetanus immunization update is due as well.

## 2020-04-21 ENCOUNTER — Encounter: Payer: Self-pay | Admitting: Internal Medicine

## 2020-04-27 ENCOUNTER — Other Ambulatory Visit: Payer: Self-pay

## 2020-04-27 ENCOUNTER — Encounter: Payer: Self-pay | Admitting: Internal Medicine

## 2020-04-27 ENCOUNTER — Ambulatory Visit (INDEPENDENT_AMBULATORY_CARE_PROVIDER_SITE_OTHER): Payer: 59 | Admitting: Internal Medicine

## 2020-04-27 VITALS — BP 120/80 | HR 82 | Ht 66.0 in | Wt 193.0 lb

## 2020-04-27 DIAGNOSIS — F419 Anxiety disorder, unspecified: Secondary | ICD-10-CM | POA: Diagnosis not present

## 2020-04-27 DIAGNOSIS — F32A Depression, unspecified: Secondary | ICD-10-CM | POA: Diagnosis not present

## 2020-04-27 DIAGNOSIS — R200 Anesthesia of skin: Secondary | ICD-10-CM | POA: Diagnosis not present

## 2020-04-27 DIAGNOSIS — E559 Vitamin D deficiency, unspecified: Secondary | ICD-10-CM | POA: Diagnosis not present

## 2020-04-27 MED ORDER — ALPRAZOLAM 0.5 MG PO TABS: ORAL_TABLET | ORAL | 0 refills | Status: DC

## 2020-04-27 MED ORDER — BUPROPION HCL ER (XL) 150 MG PO TB24: 150.0000 mg | ORAL_TABLET | Freq: Every day | ORAL | 0 refills | Status: DC

## 2020-04-27 NOTE — Progress Notes (Signed)
   Subjective:    Patient ID: Patricia Ferrell, female    DOB: 08-10-1955, 65 y.o.   MRN: [id number]  HPI 65 year old Female seen for follow up on anxiety and grief.  She will be traveling to [place] for her brother's memorial service and has been asked to speak.  We talked about this at length.  She feels that this will be a stressful situation.  Has some anxiety regarding that.  Previously was given small prescription for Valium.  It did help with anxiety but it caused fatigue and anxiety has returned.  Needs something for anxiety particularly if she obese making it [employer].  She was started on high-dose vitamin D in January 50,000 units weekly  I do think she has some element of depression and grief.  She has a history of numbness in her right foot.  Have checked B12 and folate levels which were normal.  She would like to see Dr. Everlena Cooper for evaluation.  She had an MRI of the LS spine in 2017 regarding right lumbar left radiculopathy.  At that time had small rightward disc herniation affecting the right lateral recess around S1 nerve root.  Minimal lumbar disc degeneration.  Review of Systems see above     Objective:   Physical Exam  Her affect is anxious.  She is tearful talking about upcoming trip and wondering if she will be able to speak at [facility].  Blood pressure 120/80, pulse 82 regular pulse oximetry 97% weight 193 pounds BMI 31.15  Straight leg raising is negative on the right and muscle strength in the right lower extremity is normal.      Assessment & Plan:  Right lower extremity numbness-she wants to see Dr. Everlena Cooper regarding this.  Probable lumbar disc disease causing numbness based on previous MRI.  Has appointment in May.   Anxiety and depression-situational stress with recent death of relatives and she has been asked to speak at [facility] service counseling anxiety  History of vitamin D deficiency-currently on high-dose vitamin D as level in  January was 21  Plan: Valium has been discontinued.  She will take Xanax 0.5 mg up to twice daily as needed.  I have started her on Wellbutrin 300 mg XL daily.  Follow-up here in July.  Appointment with Dr. Everlena Cooper in May.

## 2020-05-25 ENCOUNTER — Other Ambulatory Visit: Payer: Self-pay | Admitting: Internal Medicine

## 2020-05-25 NOTE — Telephone Encounter (Signed)
Patient requested a refill, she is scheduled for a follow up on 4/08.

## 2020-05-29 ENCOUNTER — Encounter: Payer: Self-pay | Admitting: Internal Medicine

## 2020-05-29 ENCOUNTER — Other Ambulatory Visit: Payer: Self-pay

## 2020-05-29 ENCOUNTER — Ambulatory Visit (INDEPENDENT_AMBULATORY_CARE_PROVIDER_SITE_OTHER): Payer: 59 | Admitting: Internal Medicine

## 2020-05-29 VITALS — BP 140/88 | HR 76 | Ht 66.0 in | Wt 185.0 lb

## 2020-05-29 DIAGNOSIS — F4321 Adjustment disorder with depressed mood: Secondary | ICD-10-CM

## 2020-05-29 DIAGNOSIS — R200 Anesthesia of skin: Secondary | ICD-10-CM | POA: Diagnosis not present

## 2020-05-29 DIAGNOSIS — E782 Mixed hyperlipidemia: Secondary | ICD-10-CM

## 2020-05-29 DIAGNOSIS — F411 Generalized anxiety disorder: Secondary | ICD-10-CM | POA: Diagnosis not present

## 2020-05-29 DIAGNOSIS — F4320 Adjustment disorder, unspecified: Secondary | ICD-10-CM

## 2020-05-29 DIAGNOSIS — F32A Depression, unspecified: Secondary | ICD-10-CM | POA: Diagnosis not present

## 2020-05-29 DIAGNOSIS — E559 Vitamin D deficiency, unspecified: Secondary | ICD-10-CM

## 2020-05-29 MED ORDER — BUPROPION HCL ER (XL) 300 MG PO TB24: 300.0000 mg | ORAL_TABLET | Freq: Every day | ORAL | 1 refills | Status: DC

## 2020-05-29 MED ORDER — ALPRAZOLAM 0.5 MG PO TABS: ORAL_TABLET | ORAL | 1 refills | Status: DC

## 2020-05-29 NOTE — Patient Instructions (Addendum)
Patient would like to be seen by Dr. Everlena Cooper for right foot numbness.  B12 and folate levels checked today.  Increase Wellbutrin from 150 mg to 300 mg XL.  May take Xanax 0.5 mg up to twice daily if needed for anxiety.  Return in 3 months for follow-up.

## 2020-05-29 NOTE — Progress Notes (Signed)
   Subjective:    Patient ID: Patricia Ferrell, female    DOB: 13-Mar-1955, 65 y.o.   MRN: [id number]  HPI 65 year old Female has numbness right foot onset several years ago. No hx of injury. She would like to see Dr. Everlena Cooper for evaluation. He saw her for right facial numbness and tinnitus in Sept 2020. Was found to have moderate low frequency sensorineural hearing loss.Was complaining of numbness right foot onset 2 years previously at that time.MRI of brain negative for acoustic neuroma. Gabapentin was offered but patient declined.Tests for Sarcoidosis, lyme disease, Sjogren's, and lupus were negative.  She says right foot numbness has persisted. Have drawn B12 and folate levels today. She wants to see Dr. Everlena Cooper again for this.  Had right knee lateral meniscectomy, medial collateral ligament reconstruction and chondroplasty right knee in April 2021 by Dr. 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  She suffered a twisting injury to her knee.  History of chronic bilateral low back pain for which she underwent PT in 2019.  History of GE reflux seen by Dr. Loreta Ave.  Former patient of Dr. Alonza Smoker who has retired.  Plan right L5-S1 lumbar laminectomy and microdiscectomy by Dr. Newell Coral in 2017.  I am wondering if this caused some residual foot numbness.  Had laparoscopic cholecystectomy in 2016.  History of allergic rhinitis and anxiety.  Dr. Nelida Meuse notes indicate patient has a history of hyperlipidemia but lost 40 pounds and lipids returned to normal. In January 2022, lipid panel here showed total cholesterol of 219, HDL low at 39, triglycerides 188 and LDL cholesterol 148.  She did not want to be on statin medication.  She has a longstanding history of anxiety and depression treated with Celexa and Ativan in the remote past around 2012.  In June 2011 she had a hysterectomy and colectomy.  Had already previously had 1 ovary removed.  Had  colonoscopy in 2011 which was normal.  Her brother passed away recently.  There is a scheduled oral service soon.  She is anxious about that because she has been asked to speak at the service..  Review of Systems hx Vitamin D deficiency.  Recent vitamin D level in January was low at 21.  Recommended high-dose vitamin D weekly for 12 weeks then she should take 5000 units daily.     Objective:   Physical Exam Her affect seems stable today.  We talked about upcoming memorial service for her brother.  I think it would be beneficial to increase Wellbutrin from 150 mg to 300 mg XL daily.  She has just been taking Xanax at night for sleep and says she is sleeping well.  She will likely need Xanax before the [employer] and "flying out to [place] for the services being healed.  Have suggested that she may take Xanax up to 1 tab twice daily if needed.  I will see her again in 3 months.       Assessment & Plan:  Longstanding history of right foot numbness.  History of lumbar back surgery L5-S1 in 2017. ?  Residual numbness status post surgery.  B12 and folate levels drawn today.  Anxiety and depression-increase Wellbutrin from 150 mg to 300 mg XL.  May take Xanax 0.05 mg up to twice daily if needed.  Return in 3 months for follow-up.  Referral to Dr. Everlena Cooper.

## 2020-05-30 LAB — VITAMIN B12: Vitamin B-12: 333 pg/mL (ref 200–1100)

## 2020-05-30 LAB — FOLATE: Folate: 6.7 ng/mL

## 2020-06-20 NOTE — Patient Instructions (Addendum)
Appointment with Dr. Everlena Cooper made for May.  Started on Wellbutrin XL 300 mg daily for anxiety and depression.  Valium has been discontinued.  She will take Xanax 0.5 mg up to twice daily as needed.  Return here in July.  Patient does not want to continue with high-dose vitamin D supplementation.

## 2020-06-26 NOTE — Progress Notes (Addendum)
NEUROLOGY FOLLOW UP OFFICE NOTE  Chloe Bluett [id number]  Assessment/Plan:   1.  Right foot numbness - involving underside of foot - likely residual from right L5-S1 surgery - will evaluate for any active nerve root irritation  1.  NCV-EMG right lower extremity 2.  Follow up after testing.  08/19/2020 ADDENDUM:  NCV-EMG of right lower extremity demonstrates chronic L5-S1 radiculopathy - numbness likely residual and chronic from prior nerve root injury.  Subjective:  Jestine Bicknell is a 65 year old female who presents for right foot numbness.  Last seen in 2020 for evaluation of episode of right sided facial tingling, right sided hearing loss and tinnitus with residual right-sided facial tingling.  She saw ENT.  Audiogram demonstrated moderate low-frequency sensorineural hearing loss rising up to normal hearing with worse word recognition score on right than the left.  Tympanogram revealed absent acoustic reflexes on the right.  Left ear was normal.  She then saw a dentist who prescribed a five day course of valacyclovir.  MRI of brain and internal auditory canals with and without contrast on 10/08/2018 was normal.  However, she still has numbness in the right side of her face.  ANA was negative, SSA/SSB antibodies negative, Lyme IgG&IgM Ab/Western Blot negative and ACE 26. Overall symptoms have cleared up.     She notes numbness and tingling in the on bottom of foot, toes and leg below the knee on the right since 2017, following right L5-S1 lumbar laminectomy and microdiscectomy.  Sometimes has back pain, on occasion may have radiation into the buttock bilaterally.  No weakness.  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  Since the surgery, her knee is always numb.  Labs in 2022 included B12 was 333, folate  6.7, TSH 1.78 and vit D 21.  Started on D supplementation.   Current medications:  Diclofenac 25mg ; venlafaxine XR 37.5mg  daily   PAST MEDICAL HISTORY: Past Medical History:  Diagnosis Date   Anemia    years ago   Anxiety    Arthritis    Asthma    bronchitis   Back pain    lumbar   Carpal tunnel syndrome    Complication of anesthesia    Family history of adverse reaction to anesthesia    mother's blood pressure always drops    GERD (gastroesophageal reflux disease)    History of hiatal hernia    Hyperlipidemia    Perimenopausal    PONV (postoperative nausea and vomiting)     MEDICATIONS: Current Outpatient Medications on File Prior to Visit  Medication Sig Dispense Refill   ALPRAZolam (XANAX) 0.5 MG tablet One tab twice daily as needed 60 tablet 1   buPROPion (WELLBUTRIN XL) 300 MG 24 hr tablet Take 1 tablet (300 mg total) by mouth daily. 30 tablet 1   famotidine (PEPCID) 20 MG tablet Take 1 tablet (20 mg total) by mouth 2 (two) times daily. 60 tablet 5   [DISCONTINUED] gabapentin (NEURONTIN) 100 MG capsule Take 1-3 capsules (100-300 mg total) by mouth 3 (three) times daily. 30 capsule 3   No current facility-administered medications on file prior to visit.    ALLERGIES: Allergies  Allergen Reactions   Hydrocodone Nausea And Vomiting   Oxycodone     Nausea, vomiting, dizzy   Codeine Nausea And Vomiting    FAMILY HISTORY: Family History  Problem  Relation Age of Onset   Coronary artery disease Mother    Heart attack Mother    Hypertension Mother    Colon polyps Father    Colon cancer Other       Objective:  Blood pressure 136/88, pulse 66, height 5\' 5"  (1.651 m), weight 134 lb 3.2 oz (60.9 kg), SpO2 100 %. General: No acute distress.  Patient appears well-groomed.   Head:  Normocephalic/atraumatic Eyes:  Fundi examined but not visualized Neck: supple, no paraspinal tenderness, full range of motion Heart:  Regular rate and rhythm Lungs:  Clear to  auscultation bilaterally Back: No paraspinal tenderness Neurological Exam: alert and oriented to person, place, and time. Attention span and concentration intact, recent and remote memory intact, fund of knowledge intact.  Speech fluent and not dysarthric, language intact.  CN II-XII intact. Bulk and tone normal, muscle strength 5/5 throughout.  Sensation to pinprick and vibration intact.  Deep tendon reflexes 2+ throughout, toes downgoing.  Finger to nose and heel to shin testing intact.  Gait normal, Romberg negative.     , DO  CC: Shon Millet, MD

## 2020-06-29 ENCOUNTER — Ambulatory Visit: Payer: 59 | Admitting: Neurology

## 2020-06-29 ENCOUNTER — Other Ambulatory Visit: Payer: Self-pay

## 2020-06-29 ENCOUNTER — Encounter: Payer: Self-pay | Admitting: Neurology

## 2020-06-29 VITALS — BP 136/88 | HR 66 | Ht 65.0 in | Wt 134.2 lb

## 2020-06-29 DIAGNOSIS — R200 Anesthesia of skin: Secondary | ICD-10-CM

## 2020-06-29 NOTE — Patient Instructions (Signed)
Likely the numbness is residual symptoms from the back surgery Check nerve study of right leg Further recommendations pending results.  Follow up after testing if needed.

## 2020-07-23 IMAGING — MR MR BRAIN/IAC WITHOUT AND WITH CONTRAST
16 of 18 series · 40 of 48 positions shown · IV contrast (Gadavist)
Comparison: None.

CLINICAL DATA: Right-sided hearing loss.  Numbness face

EXAM:
MRI HEAD WITHOUT AND WITH CONTRAST
TECHNIQUE: Multiplanar, multiecho pulse sequences of the brain and surrounding
structures were obtained without and with intravenous contrast.
CONTRAST:  10 mL Gadovist IV

[Series 5: DWI · axial · 3.0mm · 0.88mm/px · z∈[-80,+43]mm · 5 of 80 slices shown (1 of 4)]
[im 1/80]
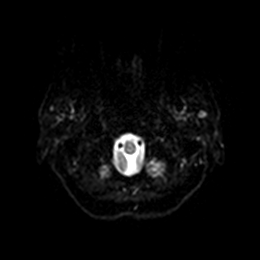
[im 20/80]
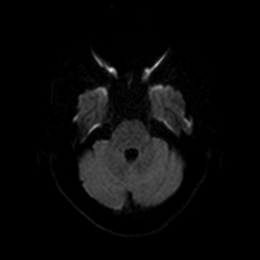
[im 40/80]
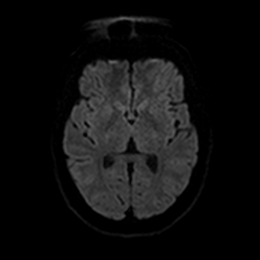
[im 60/80]
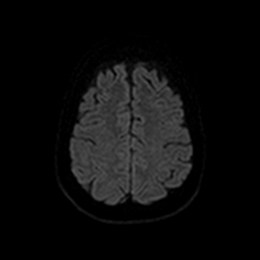
[im 80/80]
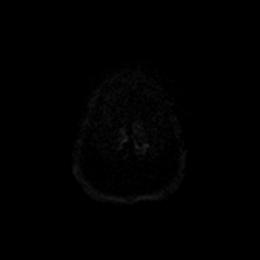

[Series 6: DWI · axial · 3.0mm · 0.88mm/px · z∈[-80,+43]mm · 2 of 40 slices shown (2 of 4)]
[im 1/40]
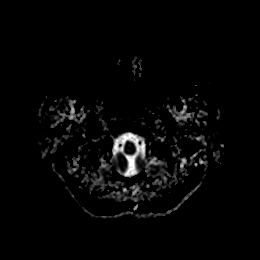
[im 40/40]
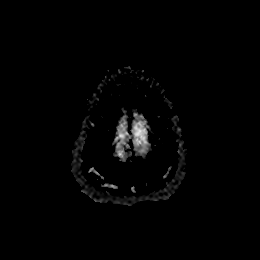

[Series 7: DWI · coronal · 4.0mm · 0.88mm/px · 5 of 64 slices shown (3 of 4)]
[im 1/64]
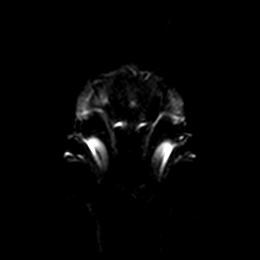
[im 16/64]
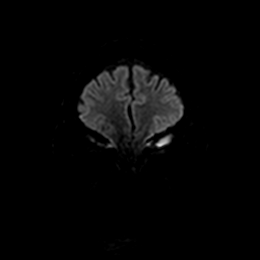
[im 32/64]
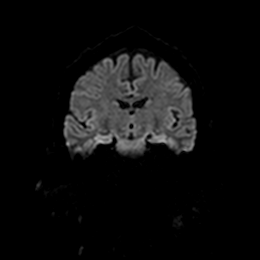
[im 48/64]
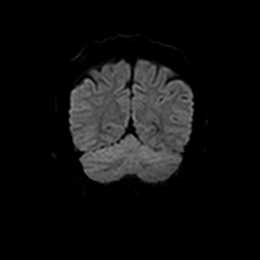
[im 64/64]
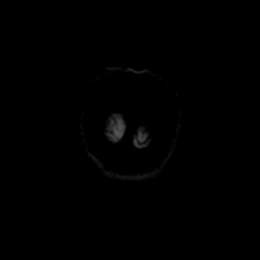

[Series 8: DWI · coronal · 4.0mm · 0.88mm/px · 2 of 32 slices shown (4 of 4)]
[im 1/32]
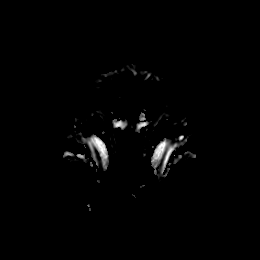
[im 32/32]
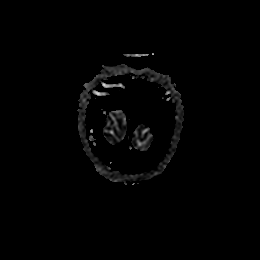

[Series 9: T1 · sagittal · 5.0mm · 0.72mm/px · 2 of 23 slices shown]
[im 1/23]
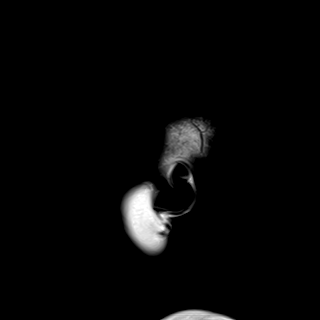
[im 23/23]
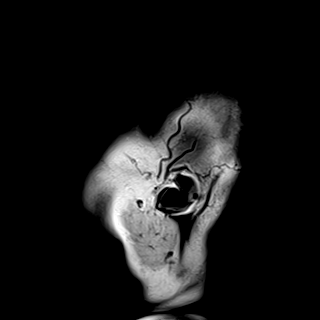

[Series 10: T2 · axial · 4.0mm · 0.72mm/px · z∈[-98,+47]mm · 2 of 30 slices shown]
[im 1/30]
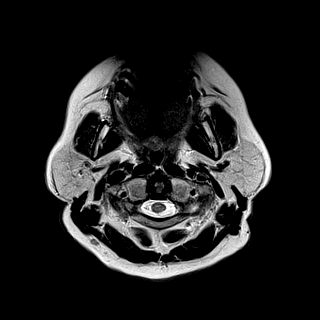
[im 30/30]
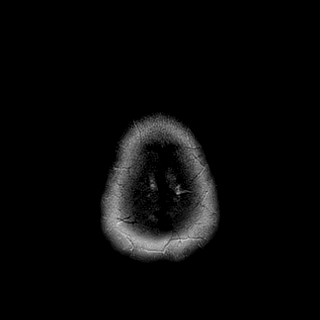

[Series 11: swi_images · axial · 3.0mm · 0.90mm/px · z∈[-98,+55]mm · 4 of 52 slices shown]
[im 1/52]
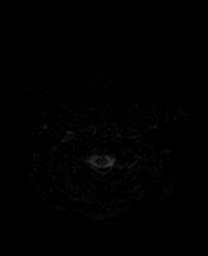
[im 18/52]
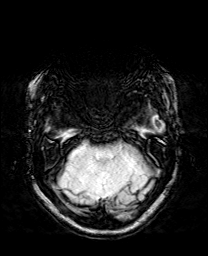
[im 35/52]
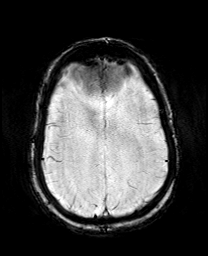
[im 52/52]
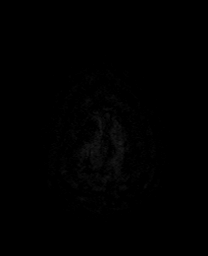

[Series 12: mip_images(sw) · axial · 24.0mm · 0.90mm/px · z∈[-88,+44]mm · 3 of 45 slices shown]
[im 1/45]
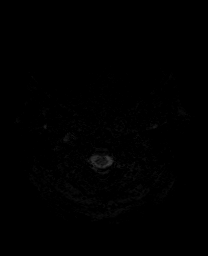
[im 23/45]
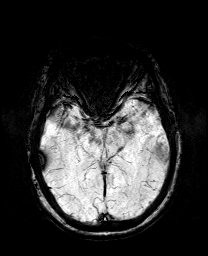
[im 45/45]
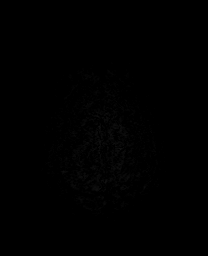

[Series 13: FLAIR · axial · 4.0mm · 0.45mm/px · z∈[-98,+47]mm · 2 of 30 slices shown]
[im 1/30]
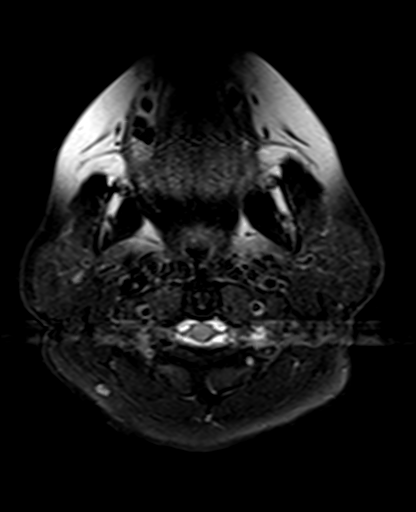
[im 30/30]
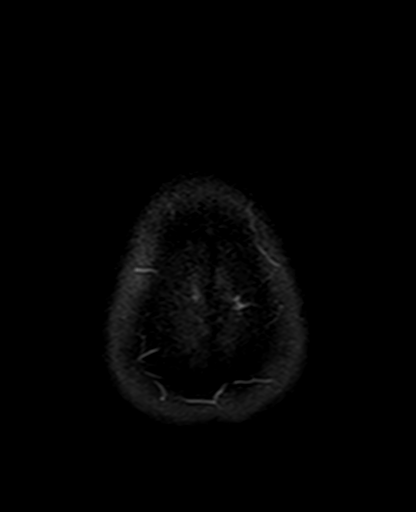

[Series 15: t2_(id)_tra_iso · axial · 0.6mm · 0.50mm/px · z∈[-81,-44]mm · 5 of 64 slices shown]
[im 1/64]
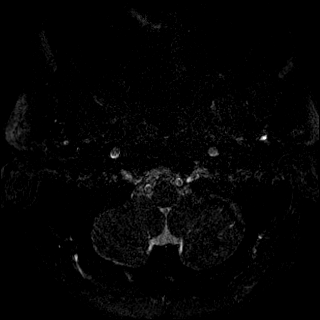
[im 16/64]
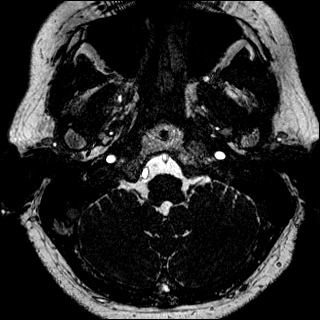
[im 32/64]
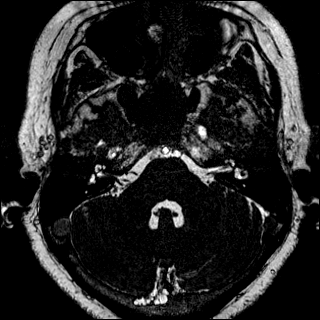
[im 48/64]
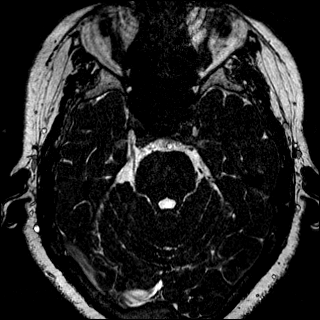
[im 64/64]
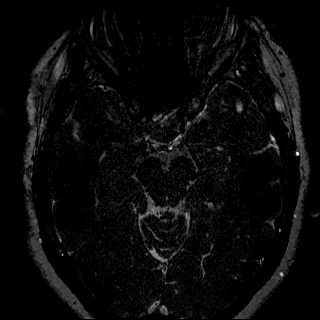

[Series 16: t1_tse_tra_3mm · axial · 3.0mm · 0.31mm/px · 1 of 15 slices shown (1 of 2)]
[im 1/15]
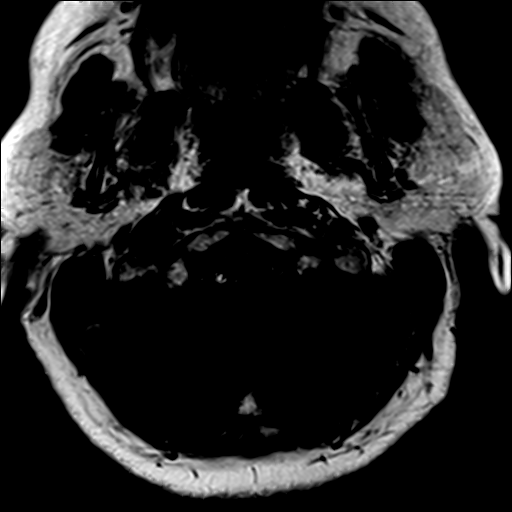

[Series 18: t1_tse_r_cor_3mm · coronal · 3.0mm · 0.33mm/px · 1 of 13 slices shown (1 of 2)]
[im 1/13]
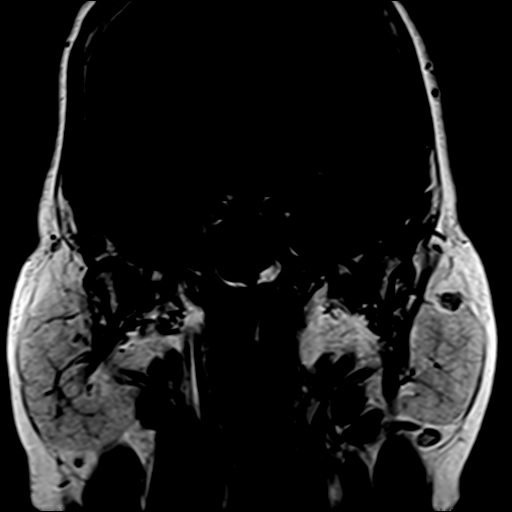

[Series 19: T2 post-contrast · coronal · 5.0mm · 0.72mm/px · 2 of 24 slices shown]
[im 1/24]
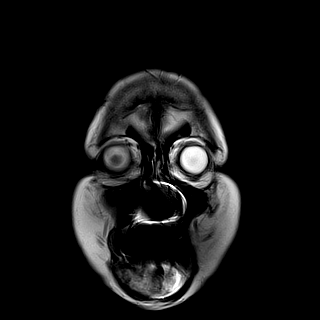
[im 24/24]
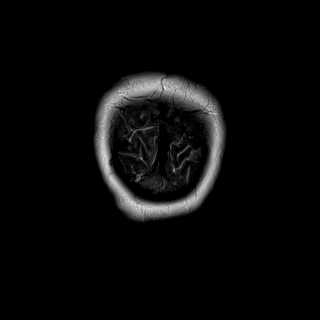

[Series 20: t1_tse_tra_3mm · axial · 3.0mm · 0.37mm/px · 1 of 15 slices shown (2 of 2)]
[im 1/15]
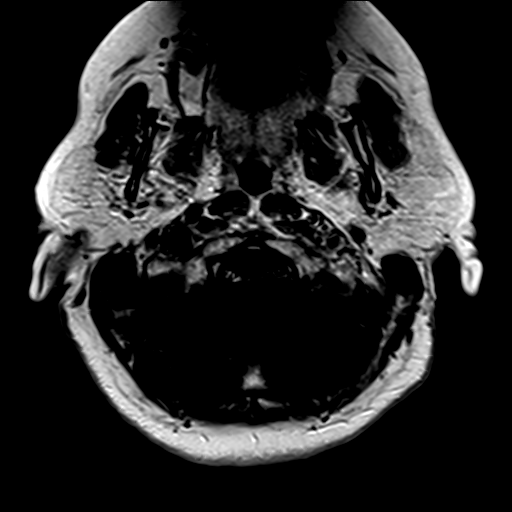

[Series 21: t1_tse_r_cor_3mm · coronal · 3.0mm · 0.33mm/px · 1 of 13 slices shown (2 of 2)]
[im 1/13]
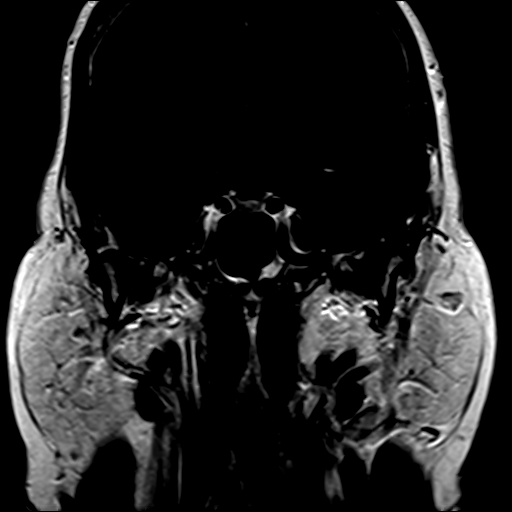

[Series 23: T1 post-contrast · coronal · 5.0mm · 0.34mm/px · 2 of 24 slices shown]
[im 1/24]
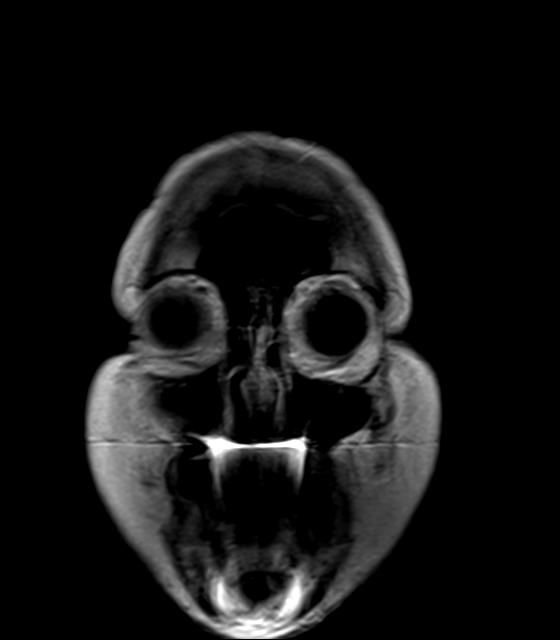
[im 24/24]
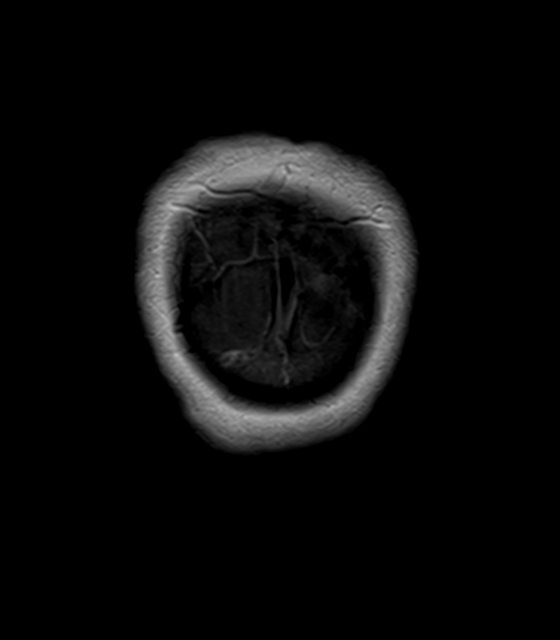

[40 of 48 positions shown; findings below may reference images not displayed]

FINDINGS: Brain: Ventricle size and cerebral volume normal. Negative for acute
infarct. No significant chronic ischemic change. Negative for
demyelinating disease.

IAC protocol. Seventh and eighth cranial nerves normal. Negative for
vestibular schwannoma. Brainstem normal. Cerebellum normal. Basilar
cisterns normal. Cisternal segment of the trigeminal nerve is normal
bilaterally. Cavernous sinus is normal bilaterally.

Vascular: Normal vascular flow voids

Skull and upper cervical spine: Negative

Sinuses/Orbits: Negative

Other: None
IMPRESSION: Negative for vestibular schwannoma. No cause for hearing loss
identified

Normal MRI brain with contrast.

## 2020-08-06 ENCOUNTER — Telehealth: Payer: Self-pay | Admitting: Internal Medicine

## 2020-08-06 MED ORDER — BUPROPION HCL ER (XL) 300 MG PO TB24: 300.0000 mg | ORAL_TABLET | Freq: Every day | ORAL | 1 refills | Status: DC

## 2020-08-06 MED ORDER — ALPRAZOLAM 0.5 MG PO TABS: ORAL_TABLET | ORAL | 1 refills | Status: DC

## 2020-08-06 NOTE — Telephone Encounter (Signed)
Shauniece went by pharmacy to get refills on below medications and the pharmacy told her since she got them filled somewhere else last month that she would need to contact doctor and get new prescriptions.  ALPRAZolam (XANAX) 0.5 MG tablet  buPROPion (WELLBUTRIN XL) 300 MG 24 hr tablet  [facility] - [place] - [address] [place] Phone:  [phone]  Fax:  [phone]

## 2020-08-06 NOTE — Telephone Encounter (Signed)
After contacting pharmacy about this request for new Rx, it seems patient had  2 Rxs transferred to a [facility] pharmacy last month thus using up her refill and needs new Rxs sent in. Have refilled Wellbutrin x 6 months and have refilled Xanax x 2 months. MJB,MD

## 2020-08-07 NOTE — Telephone Encounter (Signed)
Called [facility] and they transferred May prescriptions to [facility] in [facility] for below medications.  Xanax Welbutrin

## 2020-08-18 ENCOUNTER — Other Ambulatory Visit: Payer: Self-pay

## 2020-08-18 ENCOUNTER — Ambulatory Visit (INDEPENDENT_AMBULATORY_CARE_PROVIDER_SITE_OTHER): Payer: 59 | Admitting: Neurology

## 2020-08-18 DIAGNOSIS — R200 Anesthesia of skin: Secondary | ICD-10-CM | POA: Diagnosis not present

## 2020-08-18 DIAGNOSIS — M5417 Radiculopathy, lumbosacral region: Secondary | ICD-10-CM

## 2020-08-18 NOTE — Procedures (Signed)
[facility]  [address] [place], [address]  [place], [place] [address] Tel: [phone] Fax:  [phone] Test Date:  08/18/2020  Patient: Patricia Ferrell DOB: 1955/04/11 Physician: Nita Sickle, DO  Sex: Female Height: 5\' 5"  Ref Phys: , D.O.  ID#: Shon Millet   Technician:    Patient Complaints: This is a 65 year old female with history of right L5-S1 laminectomy referred for evaluation of right leg numbness.  NCV & EMG Findings: Extensive electrodiagnostic testing of the right lower extremity shows: Right sural and superficial peroneal sensory responses are within normal limits. Right peroneal and tibial motor responses are within normal limits. Right tibial H reflex study is within normal limits. Chronic motor axonal loss changes are seen affecting the L5-S1 myotome on the right without accompanying active denervation.  Impression: Chronic L5-S1 radiculopathy affecting the right lower extremity, mild-to-moderate. There is no evidence of a sensorimotor polyneuropathy affecting the right lower extremity.   ___________________________ 66, DO    Nerve Conduction Studies Anti Sensory Summary Table   Stim Site NR Peak (ms) Norm Peak (ms) P-T Amp (V) Norm P-T Amp  Right Sup Peroneal Anti Sensory (Ant Lat Mall)  33C  12 cm    2.6 <4.6 10.9 >3  Right Sural Anti Sensory (Lat Mall)  33C  Calf    2.5 <4.6 22.6 >3   Motor Summary Table   Stim Site NR Onset (ms) Norm Onset (ms) O-P Amp (mV) Norm O-P Amp Site1 Site2 Delta-0 (ms) Dist (cm) Vel (m/s) Norm Vel (m/s)  Right Peroneal Motor (Ext Dig Brev)  33C  Ankle    2.7 <6.0 3.8 >2.5 B Fib Ankle 8.0 38.0 48 >40  B Fib    10.7  4.4  Poplt B Fib 1.5 7.0 47 >40  Poplt    12.2  4.4         Right Tibial Motor (Abd Hall Brev)  33C  Ankle    3.0 <6.0 12.7 >4 Knee Ankle 8.6 40.0 47 >40  Knee    11.6  10.9          H Reflex Studies   NR H-Lat (ms) Lat Norm (ms) L-R H-Lat (ms)  Right Tibial (Gastroc)  33C      31.97 <35    EMG   Side Muscle Ins Act Fibs Psw Fasc Number Recrt Dur Dur. Amp Amp. Poly Poly. Comment  Right AntTibialis Nml Nml Nml Nml 1- Rapid Some 1+ Some 1+ Some 1+ N/A  Right Gastroc Nml Nml Nml Nml 2- Rapid Some 1+ Some 1+ Some 1+ N/A  Right Flex Dig Long Nml Nml Nml Nml 1- Rapid Some 1+ Some 1+ Some 1+ N/A  Right RectFemoris Nml Nml Nml Nml Nml Nml Nml Nml Nml Nml Nml Nml N/A  Right GluteusMed Nml Nml Nml Nml 1- Rapid Some 1+ Some 1+ Some 1+ N/A  Right BicepsFemS Nml Nml Nml Nml 1- Rapid Some 1+ Some 1+ Some 1+ N/A      Waveforms:

## 2020-08-20 NOTE — Progress Notes (Signed)
Patient advised.

## 2020-08-31 ENCOUNTER — Ambulatory Visit (INDEPENDENT_AMBULATORY_CARE_PROVIDER_SITE_OTHER): Payer: 59 | Admitting: Internal Medicine

## 2020-08-31 ENCOUNTER — Other Ambulatory Visit: Payer: Self-pay

## 2020-08-31 ENCOUNTER — Encounter: Payer: Self-pay | Admitting: Internal Medicine

## 2020-08-31 VITALS — BP 110/80 | HR 72 | Ht 65.0 in | Wt 162.0 lb

## 2020-08-31 DIAGNOSIS — F4321 Adjustment disorder with depressed mood: Secondary | ICD-10-CM

## 2020-08-31 DIAGNOSIS — M5416 Radiculopathy, lumbar region: Secondary | ICD-10-CM | POA: Diagnosis not present

## 2020-08-31 DIAGNOSIS — E782 Mixed hyperlipidemia: Secondary | ICD-10-CM | POA: Diagnosis not present

## 2020-08-31 DIAGNOSIS — F4320 Adjustment disorder, unspecified: Secondary | ICD-10-CM

## 2020-08-31 DIAGNOSIS — F419 Anxiety disorder, unspecified: Secondary | ICD-10-CM | POA: Diagnosis not present

## 2020-08-31 DIAGNOSIS — F32A Depression, unspecified: Secondary | ICD-10-CM

## 2020-08-31 NOTE — Patient Instructions (Addendum)
Overall she seems much less anxious and seems to be improving.  We will continue with current medications and she will follow-up in 6 months for health maintenance exam.  Continue with diet and exercise for hyperlipidemia and we will repeat evaluate this in January.

## 2020-08-31 NOTE — Progress Notes (Signed)
   Subjective:    Patient ID: Patricia Ferrell, female    DOB: 04-09-1955, 65 y.o.   MRN: [id number]  HPI 65 year old Female seen for follow up on anxiety and grief.  Was able to travel to [place] for her brother's memorial service and was able to speak at that service.  Currently taking Xanax 2.5 mg up to twice daily if needed.  Is on Wellbutrin 300 mg XL daily.  No change in right lower extremity radiculopathy.  Sometimes has issues sleeping.  Anniversaries regarding her brother such as his birthday bother her and cause some insomnia.  Overall she feels her symptoms are stable with the help of medication.  She has a history of mixed hyperlipidemia and has not wanted to be on statin therapy.  This will be reviewed again in January.  In January 2022 total cholesterol was 219, HDL low at 39, triglycerides 188 and LDL cholesterol 128.  Review of Systems Had NCV and EMG June 28,2022 per Neurology showing chronic motor axonal loss changes seen affecting L5-S1 myotome on the right without accompanying denervation consistent with chronic L5-S1 radiculopathy mild to moderate.  Says she called the Neurology office about medication for this but never heard back.  I do not see a phone call in Epic.  I told her that gabapentin would be an option if she so desires.  For now she is comfortable just taking Wellbutrin and Xanax.     Objective:   Physical Exam Blood pressure 110/80 pulse 72 pulse oximetry 96% weight 162 pounds BMI 26.96  Her affect seems improved with less grief.  Her mood appears to be stable.       Assessment & Plan:  Anxiety and depression  Grief reaction  Right lumbar radiculopathy-confirmed with recent nerve conduction test  Plan: Currently her symptoms seem to be stable on Xanax and Wellbutrin.  She will continue with these medications as prescribed.  Return in 6 months for health maintenance exam, fasting labs and evaluation of medical issues.

## 2020-11-13 ENCOUNTER — Telehealth: Payer: Self-pay | Admitting: Internal Medicine

## 2020-11-13 ENCOUNTER — Other Ambulatory Visit: Payer: Self-pay

## 2020-11-13 ENCOUNTER — Ambulatory Visit (INDEPENDENT_AMBULATORY_CARE_PROVIDER_SITE_OTHER): Payer: 59 | Admitting: Internal Medicine

## 2020-11-13 ENCOUNTER — Encounter: Payer: Self-pay | Admitting: Internal Medicine

## 2020-11-13 VITALS — BP 126/78 | HR 76 | Temp 97.5°F | Resp 15 | Ht 65.0 in | Wt 158.0 lb

## 2020-11-13 DIAGNOSIS — D692 Other nonthrombocytopenic purpura: Secondary | ICD-10-CM

## 2020-11-13 NOTE — Telephone Encounter (Signed)
Patricia Ferrell [phone]  Kameron called to say she has a patch of red spots that has popped up on her left arm she would like you to look at. They are flat and purplish red looking.

## 2020-11-13 NOTE — Telephone Encounter (Signed)
Schedule

## 2020-11-13 NOTE — Patient Instructions (Signed)
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 Taking anti-inflammatory medication may have contributed to these occurring. Patient reassured. We can do further testing if they recur.

## 2020-11-13 NOTE — Progress Notes (Signed)
   Subjective:    Patient ID: Patricia Ferrell, female    DOB: 1956-01-19, 65 y.o.   MRN: [id number]  HPI 65 year old Female had sudden appearance of a few flat dark red lesions on left forearm and became alarmed. They do not itch. Has taken some NSAID medication recently. No known trauma to the arm that would have caused these lesions. They are not painful. She is worried about a possible blood disorder.    Review of Systems see above. No fever, chills headache     Objective:   Physical Exam I think her temp ( which was obtained with ear thermometer device)  reading at 97.5 degrees is an error. BP 126/78 pulse 76 regular. RR 15.  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 Taking anti-inflammatory medication could have contributed to these skin lesions. They appear benign. Should resolve within a week to 10 days. We can do further testing if they recur.

## 2020-11-16 ENCOUNTER — Other Ambulatory Visit: Payer: Self-pay | Admitting: Internal Medicine

## 2020-11-24 ENCOUNTER — Other Ambulatory Visit: Payer: Self-pay | Admitting: Orthopaedic Surgery

## 2020-11-24 DIAGNOSIS — M25561 Pain in right knee: Secondary | ICD-10-CM

## 2020-11-26 ENCOUNTER — Other Ambulatory Visit: Payer: Self-pay

## 2020-11-26 ENCOUNTER — Ambulatory Visit
Admission: RE | Admit: 2020-11-26 | Discharge: 2020-11-26 | Disposition: A | Discharge status: Home / Self Care | Payer: 59 | Source: Ambulatory Visit | Attending: Orthopaedic Surgery | Admitting: Orthopaedic Surgery

## 2020-11-26 DIAGNOSIS — M25561 Pain in right knee: Secondary | ICD-10-CM

## 2021-01-20 NOTE — Progress Notes (Signed)
Sherry at Dr Austin Miles office notified that patient states her insurance may not pay for surgery and she may have to cancel.

## 2021-01-27 ENCOUNTER — Ambulatory Visit (HOSPITAL_BASED_OUTPATIENT_CLINIC_OR_DEPARTMENT_OTHER): Admission: RE | Admit: 2021-01-27 | Payer: 59 | Source: Home / Self Care | Admitting: Orthopaedic Surgery

## 2021-01-27 ENCOUNTER — Encounter (HOSPITAL_BASED_OUTPATIENT_CLINIC_OR_DEPARTMENT_OTHER): Admission: RE | Payer: Self-pay | Source: Home / Self Care

## 2021-01-27 ENCOUNTER — Other Ambulatory Visit: Payer: Self-pay | Admitting: Internal Medicine

## 2021-01-27 SURGERY — RECONSTRUCTION, LIGAMENT, MEDIAL COLLATERAL, KNEE
Anesthesia: Choice | Site: Knee | Laterality: Right

## 2021-02-01 ENCOUNTER — Telehealth: Payer: Self-pay

## 2021-02-01 ENCOUNTER — Encounter: Payer: Self-pay | Admitting: Internal Medicine

## 2021-02-01 ENCOUNTER — Telehealth (INDEPENDENT_AMBULATORY_CARE_PROVIDER_SITE_OTHER): Payer: 59 | Admitting: Internal Medicine

## 2021-02-01 DIAGNOSIS — Z9114 Patient's other noncompliance with medication regimen: Secondary | ICD-10-CM | POA: Diagnosis not present

## 2021-02-01 DIAGNOSIS — F419 Anxiety disorder, unspecified: Secondary | ICD-10-CM

## 2021-02-01 DIAGNOSIS — F32A Depression, unspecified: Secondary | ICD-10-CM

## 2021-02-01 DIAGNOSIS — F4321 Adjustment disorder with depressed mood: Secondary | ICD-10-CM | POA: Diagnosis not present

## 2021-02-01 MED ORDER — ALPRAZOLAM 0.5 MG PO TABS: ORAL_TABLET | ORAL | 0 refills | Status: DC

## 2021-02-01 MED ORDER — ALPRAZOLAM 0.5 MG PO TABS: 0.5000 mg | ORAL_TABLET | Freq: Every day | ORAL | 0 refills | Status: DC

## 2021-02-01 NOTE — Telephone Encounter (Signed)
Patient called and states that she lost her xanax at the funeral home during her mothers funeral. She is asking for some to be sent to [facility] because they will not let her refill until 12/23. She has been advised provider is not in office. She knows she may have to pay cash. Please advise.

## 2021-02-01 NOTE — Telephone Encounter (Signed)
Patient has a history of anxiety and depression.  Her mother passed away recently.    I connected with her by phone this evening.  She is at her home and I am at my home.  She is identified using 2 identifiers as Patricia Ferrell. [name], a patient in this practice.  She is agreeable to visit in this format today.  She has longstanding history of anxiety.  Patient says that she took her bottle of Xanax with her to the [employer] [facility] during her mother's viewing in [place] and somehow misplaced it.  Says she is been taking only 1 Xanax tablet daily.  However pharmacy will not let her refill the medication before December 23.  Occasionally she has taken an extra Xanax during circumstances surrounding the death of her mother and funeral services.  Patient says she does not know what caused her mother to pass away.  She was asleep in the bed with patient's sister when she passed away but had been ill.  Says that she continues to have grief and would like to have Xanax on hand in the upcoming days. Pharmacy has to be notified by me.to give her additional Xanax tablets. She also takes Wellbutrin for anxiety and depression.  Spoke with her this evening by telephone and she is grieving appropriately and relays to me the circumstances surrounding her mother's death and how she is reacting.  Her judgment appears to be normal and her affect is appropriatel with grief expressed on the phone.  Explained it would be wise not to carry medications such as controlled substances with her by hand especially if she is upset or anxious as she might misplace them.  Explained that controlled substances are monitored closely by pharmacy and insurance company for diversion.  I do believe patient made a mistake and lost the prescription in a moment of grief.  We will take steps to refill the medication until she can get her regular refill on December 23.  We will send in a small prescription for Xanax 0.5 mg  #12  tablets with directions 1 tab once a day.  Time spent speaking with patient and a prescribing new prescription for Xanax is 15 minutes

## 2021-02-02 ENCOUNTER — Encounter (HOSPITAL_COMMUNITY): Payer: Self-pay | Admitting: Orthopaedic Surgery

## 2021-02-02 NOTE — Progress Notes (Signed)
For Short Stay: COVID SWAB appointment date: Date of COVID positive in last 90 days:   For Anesthesia: PCP - Margaree Mackintosh, MD last office visit note 11/13/20 in epic Cardiologist -   Chest x-ray - greater than  1year EKG - 02/17/2020 in epic Stress Test -  ECHO -  Cardiac Cath -  Pacemaker/ICD device last checked: Pacemaker orders received: Device Rep notified:  Sleep Study -  CPAP -   Fasting Blood Sugar -  Checks Blood Sugar _____ times a day  Blood Thinner Instructions: Aspirin Instructions: Last Dose:  Activity level: Can go up a flight of stairs and activities of daily living without stopping and without chest pain and/or shortness of breath   Able to exercise without chest pain and/or shortness of breath   Unable to go up a flight of stairs without chest pain and/or shortness of breath     Anesthesia review:   Patient denies shortness of breath, fever, cough and chest pain at PAT appointment   Patient verbalized understanding of instructions that were given to them at the PAT appointment. Patient was also instructed that they will need to review over the PAT instructions again at home before surgery.

## 2021-02-05 NOTE — Telephone Encounter (Signed)
Med sent in.

## 2021-02-17 ENCOUNTER — Ambulatory Visit (HOSPITAL_COMMUNITY): Admission: RE | Admit: 2021-02-17 | Payer: 59 | Source: Home / Self Care | Admitting: Orthopaedic Surgery

## 2021-02-17 ENCOUNTER — Encounter (HOSPITAL_COMMUNITY): Admission: RE | Payer: Self-pay | Source: Home / Self Care

## 2021-02-17 HISTORY — DX: Depression, unspecified: F32.A

## 2021-02-17 HISTORY — DX: Vitamin D deficiency, unspecified: E55.9

## 2021-02-17 HISTORY — DX: Anesthesia of skin: R20.0

## 2021-02-17 SURGERY — ARTHROSCOPY, KNEE, WITH MCL RECONSTRUCTION
Anesthesia: Choice | Site: Knee | Laterality: Right

## 2021-03-11 ENCOUNTER — Other Ambulatory Visit: Payer: Self-pay | Admitting: Internal Medicine

## 2021-03-11 DIAGNOSIS — E782 Mixed hyperlipidemia: Secondary | ICD-10-CM

## 2021-03-11 DIAGNOSIS — F419 Anxiety disorder, unspecified: Secondary | ICD-10-CM

## 2021-03-11 DIAGNOSIS — Z1329 Encounter for screening for other suspected endocrine disorder: Secondary | ICD-10-CM

## 2021-03-11 DIAGNOSIS — E786 Lipoprotein deficiency: Secondary | ICD-10-CM

## 2021-03-15 ENCOUNTER — Ambulatory Visit (INDEPENDENT_AMBULATORY_CARE_PROVIDER_SITE_OTHER): Payer: Managed Care, Other (non HMO) | Admitting: Internal Medicine

## 2021-03-15 ENCOUNTER — Encounter: Payer: Self-pay | Admitting: Internal Medicine

## 2021-03-15 ENCOUNTER — Other Ambulatory Visit: Payer: Self-pay

## 2021-03-15 VITALS — BP 122/84 | HR 69 | Temp 98.1°F | Ht 65.0 in | Wt 166.5 lb

## 2021-03-15 DIAGNOSIS — E782 Mixed hyperlipidemia: Secondary | ICD-10-CM | POA: Diagnosis not present

## 2021-03-15 DIAGNOSIS — M8580 Other specified disorders of bone density and structure, unspecified site: Secondary | ICD-10-CM | POA: Diagnosis not present

## 2021-03-15 DIAGNOSIS — F4320 Adjustment disorder, unspecified: Secondary | ICD-10-CM

## 2021-03-15 DIAGNOSIS — L659 Nonscarring hair loss, unspecified: Secondary | ICD-10-CM

## 2021-03-15 DIAGNOSIS — Z0000 Encounter for general adult medical examination without abnormal findings: Secondary | ICD-10-CM

## 2021-03-15 DIAGNOSIS — K219 Gastro-esophageal reflux disease without esophagitis: Secondary | ICD-10-CM

## 2021-03-15 DIAGNOSIS — Z1329 Encounter for screening for other suspected endocrine disorder: Secondary | ICD-10-CM | POA: Diagnosis not present

## 2021-03-15 DIAGNOSIS — F419 Anxiety disorder, unspecified: Secondary | ICD-10-CM | POA: Diagnosis not present

## 2021-03-15 DIAGNOSIS — R200 Anesthesia of skin: Secondary | ICD-10-CM

## 2021-03-15 DIAGNOSIS — E559 Vitamin D deficiency, unspecified: Secondary | ICD-10-CM

## 2021-03-15 DIAGNOSIS — F4321 Adjustment disorder with depressed mood: Secondary | ICD-10-CM

## 2021-03-15 DIAGNOSIS — F32A Depression, unspecified: Secondary | ICD-10-CM

## 2021-03-15 LAB — CBC WITH DIFFERENTIAL/PLATELET
Absolute Monocytes: 531 cells/uL (ref 200–950)
Basophils Absolute: 83 cells/uL (ref 0–200)
Basophils Relative: 1.2 %
Eosinophils Absolute: 193 cells/uL (ref 15–500)
Eosinophils Relative: 2.8 %
HCT: 40.9 % (ref 35.0–45.0)
Hemoglobin: 13.4 g/dL (ref 11.7–15.5)
Lymphs Abs: 2422 cells/uL (ref 850–3900)
MCH: 28.6 pg (ref 27.0–33.0)
MCHC: 32.8 g/dL (ref 32.0–36.0)
MCV: 87.2 fL (ref 80.0–100.0)
MPV: 9.7 fL (ref 7.5–12.5)
Monocytes Relative: 7.7 %
Neutro Abs: 3671 cells/uL (ref 1500–7800)
Neutrophils Relative %: 53.2 %
Platelets: 257 10*3/uL (ref 140–400)
RBC: 4.69 10*6/uL (ref 3.80–5.10)
RDW: 12.3 % (ref 11.0–15.0)
Total Lymphocyte: 35.1 %
WBC: 6.9 10*3/uL (ref 3.8–10.8)

## 2021-03-15 LAB — POCT URINALYSIS DIPSTICK
Bilirubin, UA: NEGATIVE
Blood, UA: NEGATIVE
Glucose, UA: NEGATIVE
Ketones, UA: NEGATIVE
Leukocytes, UA: NEGATIVE
Nitrite, UA: NEGATIVE
Protein, UA: NEGATIVE
Spec Grav, UA: <=1.005 — AB (ref 1.010–1.025)
Urobilinogen, UA: 0.2 E.U./dL
pH, UA: 5 (ref 5.0–8.0)

## 2021-03-15 LAB — COMPLETE METABOLIC PANEL WITH GFR
AG Ratio: 2.1 (calc) (ref 1.0–2.5)
ALT: 18 U/L (ref 6–29)
AST: 20 U/L (ref 10–35)
Albumin: 4.4 g/dL (ref 3.6–5.1)
Alkaline phosphatase (APISO): 77 U/L (ref 37–153)
BUN: 19 mg/dL (ref 7–25)
CO2: 31 mmol/L (ref 20–32)
Calcium: 10.1 mg/dL (ref 8.6–10.4)
Chloride: 108 mmol/L (ref 98–110)
Creat: 0.95 mg/dL (ref 0.50–1.05)
Globulin: 2.1 g/dL (calc) (ref 1.9–3.7)
Glucose, Bld: 100 mg/dL — ABNORMAL HIGH (ref 65–99)
Potassium: 5.7 mmol/L — ABNORMAL HIGH (ref 3.5–5.3)
Sodium: 143 mmol/L (ref 135–146)
Total Bilirubin: 0.4 mg/dL (ref 0.2–1.2)
Total Protein: 6.5 g/dL (ref 6.1–8.1)
eGFR: 67 mL/min/{1.73_m2} (ref >=60)

## 2021-03-15 LAB — LIPID PANEL
Cholesterol: 257 mg/dL — ABNORMAL HIGH (ref <200)
HDL: 69 mg/dL (ref >=50)
LDL Cholesterol (Calc): 165 mg/dL (calc) — ABNORMAL HIGH
Non-HDL Cholesterol (Calc): 188 mg/dL (calc) — ABNORMAL HIGH (ref <130)
Total CHOL/HDL Ratio: 3.7 (calc) (ref <5.0)
Triglycerides: 109 mg/dL (ref <150)

## 2021-03-15 LAB — VITAMIN D 25 HYDROXY (VIT D DEFICIENCY, FRACTURES): Vit D, 25-Hydroxy: 36 ng/mL (ref 30–100)

## 2021-03-15 LAB — TSH: TSH: 1.64 mIU/L (ref 0.40–4.50)

## 2021-03-15 NOTE — Patient Instructions (Addendum)
Labs drawn and pending. Immunizations reviewed but declined. Mammogram up to date through GYN. Please contact [facility] about colonoscopy which is due. I think hair loss is due to stress. May try B vitamin supplement and multivitamin with iron. Follow up here in 6 months.

## 2021-03-15 NOTE — Progress Notes (Signed)
Subjective:    Patient ID: Patricia Ferrell, female    DOB: 02-23-1955, 66 y.o.   MRN: [id number]  HPI 66 year old Female for health maintenance exam and evaluation of medical issues. Mother died recently.  Pt says mother fell out of bed and apparently died almost instantly. Patient is grieving. Has had hair loss  which is likely due to stress. Labs drawn and are pending.  Had GYN exam by Dr. Mitchel Honour in December 2022.  Has chronic L5-S1 radiculopathy of the right lower extremity and right leg numbness.  Has seen Dr. Everlena Cooper, Neurologist and had nerve conduction studies in June.  History of right facial numbness in 2020 and also saw Dr. Everlena Cooper.  This resolved.  Had lumbar surgery July 2017.  We have records and indicates she had endoscopy at [facility] in 2011 but apparently may have had colonoscopy with Dr. Loreta Ave in October 2019.  She needs to call that office regarding follow-up.  Dr. Tawanna Cooler was her former primary care physician who is now retired.  He removed irritated keratoses x2 left anterior chest wall in March 2019.  Care everywhere records indicates she had squamous metaplasia of the cervix and had hysterectomy and left oophorectomy in 2011.   Patient says she had right oophorectomy and right Fallopian tube removed at age 41.  Has history of right knee surgery in the past.  History of back surgery 2017 by Dr. Lonni Fix and cholecystectomy in 2016.  Intolerant of codeine.   GYN records indicates she has history of condyloma and osteopenia.  Bone density study in 2019 had a T score of -1.2 in the right femoral neck and -1.3 in the left femoral neck.  HP spine score was -1.0 and she was diagnosed with osteopenia.   History of anxiety and depression treated with Effexor.  Had childbirth (vaginal delivery) 1980.  Apparently saw Dr. Mitchel Honour in December 2022 for GYN exam.  Cervix was noted to be absent and Pap was taken of vaginal cuff.          Review of  Systems Declines any immunizations at this point or Covid boosters.  Apparently had one J [employer] J Covid vaccine in April 2021.   Had mammogram recently at GYN in December 2022 per Care [facility] records     Objective:   Physical Exam Vital signs reviewed.  Blood pressure 122/84 pulse 69 pulse oximetry 99% she is afebrile BMI 27.71 Skin warm and dry.  No cervical adenopathy or thyromegaly.  No carotid bruits.  Chest clear.  Breast are without masses.  Cardiac exam: Regular rate and rhythm without ectopy or murmur.  Abdomen soft nondistended without hepatosplenomegaly masses or tenderness.  GYN exam deferred to gynecologist.  No lower extremity pitting edema.  Affect is sad and related to grief process.      Assessment & Plan:  Grief reaction due to loss of mother recently.  Apparently lost brother in January 2022  Has chronic numbness in right foot and has seen Dr. Everlena Cooper.  Had nerve conduction studies in June 2022.  B12 and folate levels were checked in early 2022 and were normal.  History of vitamin D deficiency started on high-dose vitamin D weekly in January 2022  She saw Dr. Everlena Cooper in 2020 for episode of right-sided facial tingling, right-sided hearing loss and tinnitus and residual right-sided facial tingling.  MRI of the brain in 2020 with and without contrast including internal auditory canals was normal.  ANA was negative.  Lyme titers negative, ACE level 26.  SSA/SSB antibodies negative.  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  Since surgery, knee is numb.  B12 and folate levels normal in 2022.  Numbness right foot likely residual symptoms from back surgery per Dr. Everlena Cooper  Anxiety and depression treated with Wellbutrin and Xanax  History of GE reflux treated with Pepcid 20 mg twice daily and this was  refilled.  Pure hypercholesterolemia total cholesterol was 257 and LDL cholesterol 165.  She does not want to be on statin medication.  Recommend rechecking this in 6 months.  Plan: Vitamin D level is 36 and a year ago was 21.  Continue with high-dose vitamin D supplement.

## 2021-03-19 ENCOUNTER — Other Ambulatory Visit: Payer: Managed Care, Other (non HMO) | Admitting: Internal Medicine

## 2021-03-19 ENCOUNTER — Other Ambulatory Visit: Payer: Self-pay

## 2021-03-19 DIAGNOSIS — E875 Hyperkalemia: Secondary | ICD-10-CM

## 2021-03-20 LAB — POTASSIUM: Potassium: 4.7 mmol/L (ref 3.5–5.3)

## 2021-03-26 ENCOUNTER — Encounter (INDEPENDENT_AMBULATORY_CARE_PROVIDER_SITE_OTHER): Payer: Self-pay

## 2021-05-10 ENCOUNTER — Other Ambulatory Visit: Payer: Self-pay | Admitting: Internal Medicine

## 2021-07-02 ENCOUNTER — Ambulatory Visit (HOSPITAL_BASED_OUTPATIENT_CLINIC_OR_DEPARTMENT_OTHER): Payer: Self-pay | Admitting: Orthopaedic Surgery

## 2021-07-12 ENCOUNTER — Ambulatory Visit (INDEPENDENT_AMBULATORY_CARE_PROVIDER_SITE_OTHER): Payer: 59 | Admitting: Internal Medicine

## 2021-07-12 ENCOUNTER — Telehealth: Payer: Self-pay

## 2021-07-12 ENCOUNTER — Encounter: Payer: Self-pay | Admitting: Internal Medicine

## 2021-07-12 VITALS — BP 128/90 | HR 76 | Temp 97.3°F

## 2021-07-12 DIAGNOSIS — J0100 Acute maxillary sinusitis, unspecified: Secondary | ICD-10-CM | POA: Diagnosis not present

## 2021-07-12 DIAGNOSIS — H6503 Acute serous otitis media, bilateral: Secondary | ICD-10-CM

## 2021-07-12 MED ORDER — METHYLPREDNISOLONE ACETATE 80 MG/ML IJ SUSP
80.0000 mg | Freq: Once | INTRAMUSCULAR | Status: AC
  Administered 2021-07-12: 80 mg via INTRAMUSCULAR

## 2021-07-12 MED ORDER — AZITHROMYCIN 250 MG PO TABS: ORAL_TABLET | ORAL | 0 refills | Status: AC

## 2021-07-12 NOTE — Telephone Encounter (Signed)
Appt today at 1230

## 2021-07-12 NOTE — Telephone Encounter (Signed)
Patient is having sinus issues, H/A  and productive cough with yellow sputum. She has had this for 4 weeks but it is getting worse. She has been taking sinus medication with no help. She will take a covid test and let us know what it says.

## 2021-07-12 NOTE — Progress Notes (Signed)
   Subjective:    Patient ID: Patricia Ferrell, female    DOB: 1955/09/28, 66 y.o.   MRN: [id number]  HPI Onset the week before Mother's Day with maxillary sinus pressure and headache.  Symptoms have been going on for some 4 weeks.  She took a COVID test today that was negative.  Has had a cough of off and on for several days.  No fever.  Has some headache and discolored sputum production.  Over-the-counter sinus medication has not helped.  Was seen in January for health maintenance exam.  She has history of chronic L5-S1 radiculopathy in the right lower extremity.  History of anxiety and depression treated with Wellbutrin and Xanax.  History of vitamin D deficiency.  History of anxiety.      Review of Systems see above     Objective:   Physical Exam Blood pressure 128/90 pulse 76 temperature 97.3 degrees pulse oximetry 98% skin: Warm and dry.  Nodes none.  TMs full bilaterally but not red.  Neck supple.  Chest clear.       Assessment & Plan:  Acute nonrecurrent maxillary sinusitis Bilateral serous otitis media  Plan: Depo-Medrol 80 mg IM for respiratory congestion.  Take Zithromax Z-PAK 2 tabs day 1 followed by 1 tab days 2 through 5 for maxillary sinusitis.

## 2021-07-14 ENCOUNTER — Ambulatory Visit (INDEPENDENT_AMBULATORY_CARE_PROVIDER_SITE_OTHER): Payer: 59

## 2021-07-14 ENCOUNTER — Ambulatory Visit (HOSPITAL_BASED_OUTPATIENT_CLINIC_OR_DEPARTMENT_OTHER): Payer: 59 | Admitting: Orthopaedic Surgery

## 2021-07-14 DIAGNOSIS — G8929 Other chronic pain: Secondary | ICD-10-CM

## 2021-07-14 DIAGNOSIS — M25561 Pain in right knee: Secondary | ICD-10-CM

## 2021-07-14 DIAGNOSIS — S83271A Complex tear of lateral meniscus, current injury, right knee, initial encounter: Secondary | ICD-10-CM | POA: Diagnosis not present

## 2021-07-14 NOTE — Progress Notes (Signed)
Chief Complaint: Right knee pain     History of Present Illness:    Patricia Ferrell is a 66 y.o. female presents with ongoing right knee pain for quite some time now.  She initially had an injury in April 2021 for which she underwent right knee MCL reconstruction as well as lateral meniscal debridement with Dr. Griffin Basil.  Unfortunately she did have a subsequent injury and the end of 2022 where she tripped over a dog.  Since that time she is having persistent pain about the proximal medial femoral condyle.  She is having some mechanical symptoms as well as popping and clicking.  She did have 2 injections in 2022 with very limited pain relief.  She has started to hike although this has been quite limited as result of her knee pain.  She has been using a hinged brace which helps somewhat particularly with hiking.  She does complain of the knee buckling.  She overall is hoping to remain active and does enjoy hiking.  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    Surgical History:   As detailed above  PMH/PSH/Family History/Social History/Meds/Allergies:    Past Medical History:  Diagnosis Date   Anemia    years ago   Anxiety    Arthritis    Asthma    bronchitis   Back pain    lumbar   Carpal tunnel syndrome    Complication of anesthesia    Depression    Family history of adverse reaction to anesthesia    mother's blood pressure always drops    GERD (gastroesophageal reflux disease)    History of hiatal hernia    Hyperlipidemia    Numbness of right foot    Perimenopausal    PONV (postoperative nausea and vomiting)    Vitamin D deficiency    Past Surgical History:  Procedure Laterality Date   ABDOMINAL HYSTERECTOMY     DUB   CHOLECYSTECTOMY  06/19/2014   CHOLECYSTECTOMY N/A 06/19/2014   Procedure: LAPAROSCOPIC CHOLECYSTECTOMY WITH INTRAOPERATIVE CHOLANGIOGRAM;  Surgeon: Donnie Mesa, MD;  Location: [place];  Service: General;  Laterality: N/A;    CHONDROPLASTY Right 06/06/2019   Procedure: KNEE ARTHROSCOPY WITH CHONDROPLASTY;  Surgeon: Hiram Gash, MD;  Location: [place];  Service: Orthopedics;  Laterality: Right;   COLONOSCOPY     KNEE ARTHROSCOPY WITH LATERAL MENISECTOMY Right 06/06/2019   Procedure: KNEE ARTHROSCOPY WITH LATERAL MENISECTOMY;  Surgeon: Hiram Gash, MD;  Location: [place];  Service: Orthopedics;  Laterality: Right;   KNEE ARTHROSCOPY WITH MEDIAL COLLATERAL LIGAMENT RECONSTRUCTION Right 06/06/2019   Procedure: KNEE ARTHROSCOPY WITH MEDIAL COLLATERAL LIGAMENT RECONSTRUCTION;  Surgeon: Hiram Gash, MD;  Location: [place];  Service: Orthopedics;  Laterality: Right;   LUMBAR LAMINECTOMY/DECOMPRESSION MICRODISCECTOMY Right 09/16/2015   Procedure: Right - Lumbar five-sacral one  lumbar laminotomy and microdiscectomy;  Surgeon: Jovita Gamma, MD;  Location: [facility];  Service: Neurosurgery;  Laterality: Right;   OOPHORECTOMY  02/21/94   left   Social History   Socioeconomic History   Marital status: Divorced    Spouse name: Not on file   Number of children: 1   Years of education: 12   Highest education level: Not on file  Occupational History   Occupation: special needs care  Tobacco Use   Smoking status: Former    Types: Cigarettes    Quit date: 02/21/1988    Years since quitting: 33.4   Smokeless tobacco: Never  Vaping Use   Vaping Use: Never used  Substance and Sexual Activity   Alcohol use: Yes    Alcohol/week: 0.0 standard drinks    Comment: occasional   Drug use: No   Sexual activity: Never  Other Topics Concern   Not on file  Social History Narrative   One story home   One child   Decaf mostly    Right handed    Family history: History of hypertension osteoporosis heart disease thyroid disorder and lung tumor as well as COPD in mother.  Father with history of hypertension heart disease and arthritis.       Social Determinants of Health    Financial Resource Strain: Not on file  Food Insecurity: Not on file  Transportation Needs: Not on file  Physical Activity: Not on file  Stress: Not on file  Social Connections: Not on file   Family History  Problem Relation Age of Onset   Coronary artery disease Mother    Heart attack Mother    Hypertension Mother    Colon polyps Father    Colon cancer Other    Allergies  Allergen Reactions   Hydrocodone Nausea And Vomiting   Oxycodone     Nausea, vomiting, dizzy   Codeine Nausea And Vomiting   Current Outpatient Medications  Medication Sig Dispense Refill   ALPRAZolam (XANAX) 0.5 MG tablet TAKE 1 TABLET BY MOUTH TWICE DAILY AS NEEDED 60 tablet 2   azithromycin (ZITHROMAX) 250 MG tablet Take 2 tablets on day 1, then 1 tablet daily on days 2 through 5 6 tablet 0   buPROPion (WELLBUTRIN XL) 300 MG 24 hr tablet TAKE 1 TABLET BY MOUTH DAILY 90 tablet 1   Calcium Carbonate-Vit D-Min (CALCIUM 1200 PO) calcium     Cyanocobalamin (B-12 PO) B12     ELDERBERRY PO Elderberry     famotidine (PEPCID) 20 MG tablet TAKE 1 TABLET BY MOUTH TWICE DAILY 60 tablet 5   Multiple Vitamin (MULTIVITAMIN ADULT PO) multivitamin     No current facility-administered medications for this visit.   No results found.  Review of Systems:   A ROS was performed including pertinent positives and negatives as documented in the HPI.  Physical Exam :   Constitutional: NAD and appears stated age Neurological: Alert and oriented Psych: Appropriate affect and cooperative There were no vitals taken for this visit.   Comprehensive Musculoskeletal Exam:      Musculoskeletal Exam  Gait Normal  Alignment Normal   Right Left  Inspection Normal Normal  Palpation    Tenderness Lateral joint, medial femoral condyle None  Crepitus None None  Effusion Trace None  Range of Motion    Extension 0 0  Flexion 135 135  Strength    Extension 5/5 5/5  Flexion 5/5 5/5  Ligament Exam     Generalized Laxity No  No  Lachman Negative Negative   Pivot Shift Negative Negative  Anterior Drawer Negative Negative  Valgus at 0 Negative Negative  Valgus at 20 Painful about medial femoral condyle without significant laxity Negative  Varus at 0 0 0  Varus at 20   0 0  Posterior Drawer at 90 0 0  Vascular/Lymphatic Exam    Edema None None  Venous Stasis Changes No No  Distal Circulation Normal Normal  Neurologic  Light Touch Sensation Intact Intact  Special Tests: Positive McMurray laterally on the right knee     Imaging:   Xray (4 views right knee including a limb length view): Essentially normal right knee with very mild valgus deformity of a 2 degrees on the right side  MRI (right knee): There is evidence of tearing of the anterior horn of the lateral meniscus with an overall diminutive appearing meniscus with subsequent extrusion.  There is focal chondral loss particularly involving the tibial plateau on the lateral aspect of the knee.  There is a read tear of her proximal femoral graft of the MCL.  There is an intact portion of the graft posteriorly  I personally reviewed and interpreted the radiographs.   Assessment:   66 y.o. female with a complex right knee.  She does unfortunately have a partial retear of the proximal femoral attachment of her MCL reconstruction which was done 2 years prior.  This is overall quite symptomatic and painful for her.  She does not have symptoms which are consistent with gross instability and overall she has been doing quite well with bracing.  She does state that she continues to have lateral based knee pain as well.  This is quite bothersome to her.  She has been living with this now for several years and is not satisfied with the quality of her knee.  At this time, I have explained that it is somewhat difficult to completely counsel her with regard to the knee.  But I do believe that her MCL is symptomatic and should be addressed I do not fully understand the  treatment options available for the lateral aspect of the knee.  To this effect should an MCL procedure be needed, I believe that arthroscopy for assessment of the lateral compartment would be necessary.  This would specifically allow me to counsel her on any type of preservation procedures including meniscal repair versus segmental repair.  I do believe that these would all be possibilities given the fact that she is still very active and the remainder of her knee looks quite good on the MRI.  After significant further discussion, I did also broach the possibility of office arthroscopy.  I described that this would allow me to specifically further assess her lateral compartment so that we can ultimately advise her better on a single-stage procedure.  Given the fact that she does have a very mild valgus deformity I do not believe that there is a role or need for any type of osteotomy.  I did describe that her slight valgus is likely contributing to her lateral compartment pain in the setting of MCL insufficiency.  Plan :    -Plan for right knee office arthroscopy   After a lengthy discussion of treatment options, including risks, benefits, alternatives, complications of surgical and nonsurgical conservative options, the patient elected surgical repair.   The patient  is aware of the material risks  and complications including, but not limited to injury to adjacent structures, neurovascular injury, infection, numbness, bleeding, implant failure, thermal burns, stiffness, persistent pain, failure to heal, disease transmission from allograft, need for further surgery, dislocation, anesthetic risks, blood clots, risks of death,and others. The probabilities of surgical success and failure discussed with patient given their particular co-morbidities.The time and nature of expected rehabilitation and recovery was discussed.The patient's questions were all answered preoperatively.  No barriers to understanding were  noted. I explained the natural history of the disease process and Rx rationale.  I explained  to the patient what I considered to be reasonable expectations given their personal situation.  The final treatment plan was arrived at through a shared patient decision making process model.      I personally saw and evaluated the patient, and participated in the management and treatment plan.  Vanetta Mulders, MD Attending Physician, Orthopedic Surgery  This document was dictated using Dragon voice recognition software. A reasonable attempt at proof reading has been made to minimize errors.

## 2021-07-14 NOTE — Addendum Note (Signed)
Addended by: Benancio Deeds on: 07/14/2021 10:31 AM   Modules accepted: Level of Service

## 2021-07-16 ENCOUNTER — Encounter (HOSPITAL_BASED_OUTPATIENT_CLINIC_OR_DEPARTMENT_OTHER): Payer: Self-pay | Admitting: Orthopaedic Surgery

## 2021-07-16 ENCOUNTER — Other Ambulatory Visit (HOSPITAL_BASED_OUTPATIENT_CLINIC_OR_DEPARTMENT_OTHER): Payer: Self-pay | Admitting: Orthopaedic Surgery

## 2021-07-16 MED ORDER — DICLOFENAC SODIUM 75 MG PO TBEC: 75.0000 mg | DELAYED_RELEASE_TABLET | Freq: Two times a day (BID) | ORAL | 4 refills | Status: DC

## 2021-07-21 NOTE — Patient Instructions (Signed)
Depo-Medrol 80 mg IM.  Take Zithromax Z-PAK 2 tabs day 1 followed by 1 tab days 2 through 5.  Call if not better in 7 to 10 days or sooner if worse. 

## 2021-08-05 ENCOUNTER — Other Ambulatory Visit: Payer: Self-pay | Admitting: Internal Medicine

## 2021-08-19 ENCOUNTER — Encounter (HOSPITAL_BASED_OUTPATIENT_CLINIC_OR_DEPARTMENT_OTHER): Payer: Self-pay | Admitting: Orthopaedic Surgery

## 2021-08-20 ENCOUNTER — Ambulatory Visit (INDEPENDENT_AMBULATORY_CARE_PROVIDER_SITE_OTHER): Payer: 59 | Admitting: Orthopaedic Surgery

## 2021-08-20 ENCOUNTER — Other Ambulatory Visit (HOSPITAL_BASED_OUTPATIENT_CLINIC_OR_DEPARTMENT_OTHER): Payer: Self-pay

## 2021-08-20 DIAGNOSIS — S83261A Peripheral tear of lateral meniscus, current injury, right knee, initial encounter: Secondary | ICD-10-CM | POA: Diagnosis not present

## 2021-08-20 MED ORDER — TRAMADOL HCL 50 MG PO TABS
50.0000 mg | ORAL_TABLET | Freq: Four times a day (QID) | ORAL | 0 refills | Status: DC | PRN
  Filled 2021-08-20: qty 10, 3d supply, fill #0

## 2021-08-20 NOTE — Progress Notes (Signed)
   Date of Surgery: 08/20/2021  INDICATIONS: [name] is a 66 y.o.-year-old female with with a right knee MCL rupture after previous reconstruction as well as concern for lateral compartment deficiency in the setting of known right knee lateral previous meniscal debridement.  She is here today for diagnostic arthroscopy.  The risk and benefits of the procedure were discussed in detail and documented in the pre-operative evaluation.   PREOPERATIVE DIAGNOSIS: 1.  Right knee meniscal deficiency  POSTOPERATIVE DIAGNOSIS: Same.  PROCEDURE: 1.  Right knee diagnostic arthroscopy  SURGEON: Benancio Deeds MD  ASSISTANT: Kerby Less, ATC  ANESTHESIA: 1% lidocaine local  IV FLUIDS AND URINE: See anesthesia record.  ESTIMATED BLOOD LOSS: 1 mL.  IMPLANTS:  * No surgical log found *  DRAINS: None  CULTURES: None  COMPLICATIONS: none  DESCRIPTION OF PROCEDURE:  The patient presented to the outpatient setting.  The lateral anterior portal was prepped sterilely.  The site was infiltrated with 1% lidocaine local.  A total of 10 cc was used.  This was done down to the joint line.  11 blade was used to make an anterior lateral incision.  The scope trocar was introduced.  Diagnostic arthroscopy was performed with the following findings.  The fluid was evacuated.  Wound was closed with a single 3-0 nylon simple suture.  Sterile bandage was applied.   Intra-operative findings: A thorough arthroscopic examination of the knee was performed.  The findings are: 1. Suprapatellar pouch: Normal 2. Undersurface of median ridge: Normal 3. Medial patellar facet: Normal 4. Lateral patellar facet: Normal 5. Trochlea: Normal 6. Lateral gutter/popliteus tendon: Normal 7. Hoffa's fat pad: Normal 8. Medial gutter/plica: Normal 9. ACL: Normal 10. PCL: Normal 11. Medial meniscus: Normal 12. Medial compartment cartilage: Normal 13. Lateral meniscus: Near complete subtotal meniscectomy involving the anterior  half of the meniscus 14. Lateral compartment cartilage: Grade 1 changes    POSTOPERATIVE PLAN: She will be weightbearing as tolerated on the right leg.  I will see her back in 2 weeks for suture removal and further surgical discussion  Benancio Deeds, MD 3:20 PM

## 2021-08-27 ENCOUNTER — Encounter: Payer: Self-pay | Admitting: Ophthalmology

## 2021-08-27 ENCOUNTER — Ambulatory Visit (INDEPENDENT_AMBULATORY_CARE_PROVIDER_SITE_OTHER): Payer: 59 | Admitting: Orthopaedic Surgery

## 2021-08-27 DIAGNOSIS — S83261A Peripheral tear of lateral meniscus, current injury, right knee, initial encounter: Secondary | ICD-10-CM | POA: Diagnosis not present

## 2021-08-27 DIAGNOSIS — S83411A Sprain of medial collateral ligament of right knee, initial encounter: Secondary | ICD-10-CM | POA: Diagnosis not present

## 2021-08-27 MED ORDER — TRIAMCINOLONE ACETONIDE 40 MG/ML IJ SUSP
80.0000 mg | INTRAMUSCULAR | Status: AC | PRN
  Administered 2021-08-27: 80 mg via INTRA_ARTICULAR

## 2021-08-27 MED ORDER — LIDOCAINE HCL 1 % IJ SOLN
4.0000 mL | INTRAMUSCULAR | Status: AC | PRN
  Administered 2021-08-27: 4 mL

## 2021-08-27 NOTE — Progress Notes (Signed)
Chief Complaint: Right knee pain     History of Present Illness:   08/27/2021: Presents today for follow-up status post right knee diagnostic arthroscopy 1 week prior on 08/20/21  Patricia Ferrell is a 66 y.o. female presents with ongoing right knee pain for quite some time now.  She initially had an injury in April 2021 for which she underwent right knee MCL reconstruction as well as lateral meniscal debridement with Dr. Griffin Basil.  Unfortunately she did have a subsequent injury and the end of 2022 where she tripped over a dog.  Since that time she is having persistent pain about the proximal medial femoral condyle.  She is having some mechanical symptoms as well as popping and clicking.  She did have 2 injections in 2022 with very limited pain relief.  She has started to hike although this has been quite limited as result of her knee pain.  She has been using a hinged brace which helps somewhat particularly with hiking.  She does complain of the knee buckling.  She overall is hoping to remain active and does enjoy hiking.  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    Surgical History:   As detailed above  PMH/PSH/Family History/Social History/Meds/Allergies:    Past Medical History:  Diagnosis Date  . Anemia    years ago  . Anxiety   . Arthritis   . Asthma    bronchitis  . Back pain    lumbar  . Carpal tunnel syndrome   . Complication of anesthesia   . Depression   . Family history of adverse reaction to anesthesia    mother's blood pressure always drops   . GERD (gastroesophageal reflux disease)   . History of hiatal hernia   . Hyperlipidemia   . Numbness of right foot   . Perimenopausal   . PONV (postoperative nausea and vomiting)   . Vitamin D deficiency    Past Surgical History:  Procedure Laterality Date  . ABDOMINAL HYSTERECTOMY     DUB  . CHOLECYSTECTOMY  06/19/2014  . CHOLECYSTECTOMY N/A 06/19/2014   Procedure: LAPAROSCOPIC  CHOLECYSTECTOMY WITH INTRAOPERATIVE CHOLANGIOGRAM;  Surgeon: Donnie Mesa, MD;  Location: [facility];  Service: General;  Laterality: N/A;  . CHONDROPLASTY Right 06/06/2019   Procedure: KNEE ARTHROSCOPY WITH CHONDROPLASTY;  Surgeon: Hiram Gash, MD;  Location: [facility];  Service: Orthopedics;  Laterality: Right;  . COLONOSCOPY    . KNEE ARTHROSCOPY WITH LATERAL MENISECTOMY Right 06/06/2019   Procedure: KNEE ARTHROSCOPY WITH LATERAL MENISECTOMY;  Surgeon: Hiram Gash, MD;  Location: [place];  Service: Orthopedics;  Laterality: Right;  . KNEE ARTHROSCOPY WITH MEDIAL COLLATERAL LIGAMENT RECONSTRUCTION Right 06/06/2019   Procedure: KNEE ARTHROSCOPY WITH MEDIAL COLLATERAL LIGAMENT RECONSTRUCTION;  Surgeon: Hiram Gash, MD;  Location: [place];  Service: Orthopedics;  Laterality: Right;  . LUMBAR LAMINECTOMY/DECOMPRESSION MICRODISCECTOMY Right 09/16/2015   Procedure: Right - Lumbar five-sacral one  lumbar laminotomy and microdiscectomy;  Surgeon: Jovita Gamma, MD;  Location: [facility];  Service: Neurosurgery;  Laterality: Right;  . OOPHORECTOMY  02/21/94   left   Social History   Socioeconomic History  . Marital status: Divorced    Spouse name: Not on file  . Number of children: 1  . Years of education: 83  .  Highest education level: Not on file  Occupational History  . Occupation: special needs care  Tobacco Use  . Smoking status: Former    Types: Cigarettes    Quit date: 02/21/1988    Years since quitting: 33.5  . Smokeless tobacco: Never  Vaping Use  . Vaping Use: Never used  Substance and Sexual Activity  . Alcohol use: Yes    Alcohol/week: 0.0 standard drinks of alcohol    Comment: occasional  . Drug use: No  . Sexual activity: Never  Other Topics Concern  . Not on file  Social History Narrative   One story home   One child   Decaf mostly    Right handed    Family history: History of hypertension osteoporosis heart  disease thyroid disorder and lung tumor as well as COPD in mother.  Father with history of hypertension heart disease and arthritis.       Social Determinants of Health   Financial Resource Strain: Not on file  Food Insecurity: Not on file  Transportation Needs: Not on file  Physical Activity: Not on file  Stress: Not on file  Social Connections: Not on file   Family History  Problem Relation Age of Onset  . Coronary artery disease Mother   . Heart attack Mother   . Hypertension Mother   . Colon polyps Father   . Colon cancer Other    Allergies  Allergen Reactions  . Hydrocodone Nausea And Vomiting  . Oxycodone     Nausea, vomiting, dizzy  . Codeine Nausea And Vomiting   Current Outpatient Medications  Medication Sig Dispense Refill  . ALPRAZolam (XANAX) 0.5 MG tablet TAKE 1 TABLET BY MOUTH TWICE DAILY AS NEEDED 60 tablet 2  . buPROPion (WELLBUTRIN XL) 300 MG 24 hr tablet TAKE 1 TABLET BY MOUTH DAILY 90 tablet 1  . Calcium Carbonate-Vit D-Min (CALCIUM 1200 PO) calcium    . COLLAGEN PO Take 1 Scoop by mouth daily after breakfast.    . Cyanocobalamin (B-12 PO) B12    . diclofenac (VOLTAREN) 75 MG EC tablet Take 1 tablet (75 mg total) by mouth 2 (two) times daily. 30 tablet 4  . ELDERBERRY PO Elderberry    . famotidine (PEPCID) 20 MG tablet TAKE 1 TABLET BY MOUTH TWICE DAILY 60 tablet 5  . Multiple Vitamin (MULTIVITAMIN ADULT PO) multivitamin    . traMADol (ULTRAM) 50 MG tablet Take 1 tablet (50 mg total) by mouth every 6 (six) hours as needed. 10 tablet 0   No current facility-administered medications for this visit.   No results found.  Review of Systems:   A ROS was performed including pertinent positives and negatives as documented in the HPI.  Physical Exam :   Constitutional: NAD and appears stated age Neurological: Alert and oriented Psych: Appropriate affect and cooperative There were no vitals taken for this visit.   Comprehensive Musculoskeletal Exam:       Musculoskeletal Exam  Gait Normal  Alignment Normal   Right Left  Inspection Normal Normal  Palpation    Tenderness Lateral joint, medial femoral condyle None  Crepitus None None  Effusion Trace None  Range of Motion    Extension 0 0  Flexion 135 135  Strength    Extension 5/5 5/5  Flexion 5/5 5/5  Ligament Exam     Generalized Laxity No No  Lachman Negative Negative   Pivot Shift Negative Negative  Anterior Drawer Negative Negative  Valgus at 0 Negative Negative  Valgus  at 20 Painful about medial femoral condyle without significant laxity Negative  Varus at 0 0 0  Varus at 20   0 0  Posterior Drawer at 90 0 0  Vascular/Lymphatic Exam    Edema None None  Venous Stasis Changes No No  Distal Circulation Normal Normal  Neurologic    Light Touch Sensation Intact Intact  Special Tests: Positive McMurray laterally on the right knee     Imaging:   Xray (4 views right knee including a limb length view): Essentially normal right knee with very mild valgus deformity of a 2 degrees on the right side  MRI (right knee): There is evidence of tearing of the anterior horn of the lateral meniscus with an overall diminutive appearing meniscus with subsequent extrusion.  There is focal chondral loss particularly involving the tibial plateau on the lateral aspect of the knee.  There is a read tear of her proximal femoral graft of the MCL.  There is an intact portion of the graft posteriorly  I personally reviewed and interpreted the radiographs.   Assessment:   66 y.o. female with a complex right knee.  She does unfortunately have a partial retear of the proximal femoral attachment of her MCL reconstruction which was done 2 years prior.  At today's visit the majority of her pain is involving the lateral joint line.  She did have diagnostic arthroscopy in office with me 1 week prior which revealed grade 2-3 cartilage loss involving the lateral tibial plateau as well as a complete  meniscectomy of the lateral meniscus.  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  Unfortunately I do believe that this is progressed significantly enough that I do not believe joint preservation would necessarily give her a durable long-lasting outcome.  To that effect I do believe that if anything were to be performed a more constrained knee arthroplasty would be required.  That being said her arthritis is not significant enough that she would want to pursue this at this time.  Side effects we will plan for a right knee intra-articular guided injection we will plan for physical therapy for strengthening of the right knee.  I also provided her with an osteoarthritis hinged brace to provide some additional support Plan :    -Plan for right knee intra-articular ultrasound-guided injection after verbal consent obtained     Procedure Note  Patient: Patricia Ferrell             Date of Birth: [birth year]           MRN: [id number]             Visit Date: 08/27/2021  Procedures: Visit Diagnoses: No diagnosis found.  Large Joint Inj: R knee on 08/27/2021 1:36 PM Indications: pain Details: 22 G 1.5 in needle, ultrasound-guided anterior approach  Arthrogram: No  Medications: 4 mL lidocaine 1 %; 80 mg triamcinolone acetonide 40 MG/ML Outcome: tolerated well, no immediate complications Procedure, treatment alternatives, risks and benefits explained, specific risks discussed. Consent was given by the patient. Immediately prior to procedure a time out was called to verify the correct patient, procedure, equipment, support staff and site/side marked as required. Patient was prepped and draped in the usual sterile fashion.        I personally saw and evaluated the patient, and participated in the management and treatment plan.  Vanetta Mulders, MD Attending Physician, Orthopedic Surgery  This document was  dictated using [employer]. A reasonable attempt at proof reading has been made to minimize errors.

## 2021-08-31 ENCOUNTER — Ambulatory Visit (HOSPITAL_BASED_OUTPATIENT_CLINIC_OR_DEPARTMENT_OTHER): Payer: 59 | Attending: Orthopaedic Surgery | Admitting: Physical Therapy

## 2021-08-31 ENCOUNTER — Encounter (HOSPITAL_BASED_OUTPATIENT_CLINIC_OR_DEPARTMENT_OTHER): Payer: Self-pay | Admitting: Physical Therapy

## 2021-08-31 DIAGNOSIS — G8929 Other chronic pain: Secondary | ICD-10-CM | POA: Insufficient documentation

## 2021-08-31 DIAGNOSIS — X58XXXA Exposure to other specified factors, initial encounter: Secondary | ICD-10-CM | POA: Insufficient documentation

## 2021-08-31 DIAGNOSIS — S83411A Sprain of medial collateral ligament of right knee, initial encounter: Secondary | ICD-10-CM | POA: Insufficient documentation

## 2021-08-31 DIAGNOSIS — M25561 Pain in right knee: Secondary | ICD-10-CM | POA: Insufficient documentation

## 2021-08-31 DIAGNOSIS — R2689 Other abnormalities of gait and mobility: Secondary | ICD-10-CM | POA: Insufficient documentation

## 2021-08-31 NOTE — Therapy (Addendum)
OUTPATIENT PHYSICAL THERAPY LOWER EXTREMITY EVALUATION/discharge    Patient Name: Patricia Ferrell MRN: [id number] DOB:07/28/1955, 65 y.o., female Today's Date: 08/31/2021   PT End of Session - 08/31/21 1202     Visit Number 1    Number of Visits 17    PT Start Time 0930    PT Stop Time 1010    PT Time Calculation (min) 40 min    Activity Tolerance Patient tolerated treatment well    Behavior During Therapy WFL for tasks assessed/performed             Past Medical History:  Diagnosis Date   Anemia    years ago   Anxiety    Arthritis    Asthma    bronchitis   Back pain    lumbar   Carpal tunnel syndrome    Complication of anesthesia    Depression    Family history of adverse reaction to anesthesia    mother's blood pressure always drops    GERD (gastroesophageal reflux disease)    History of hiatal hernia    Hyperlipidemia    Numbness of right foot    Perimenopausal    PONV (postoperative nausea and vomiting)    Vitamin D deficiency    Past Surgical History:  Procedure Laterality Date   ABDOMINAL HYSTERECTOMY     DUB   CHOLECYSTECTOMY  06/19/2014   CHOLECYSTECTOMY N/A 06/19/2014   Procedure: LAPAROSCOPIC CHOLECYSTECTOMY WITH INTRAOPERATIVE CHOLANGIOGRAM;  Surgeon: Matthew Tsuei, MD;  Location: [facility];  Service: General;  Laterality: N/A;   CHONDROPLASTY Right 06/06/2019   Procedure: KNEE ARTHROSCOPY WITH CHONDROPLASTY;  Surgeon: Varkey, Dax T, MD;  Location: [facility];  Service: Orthopedics;  Laterality: Right;   COLONOSCOPY     KNEE ARTHROSCOPY WITH LATERAL MENISECTOMY Right 06/06/2019   Procedure: KNEE ARTHROSCOPY WITH LATERAL MENISECTOMY;  Surgeon: Varkey, Dax T, MD;  Location: [facility];  Service: Orthopedics;  Laterality: Right;   KNEE ARTHROSCOPY WITH MEDIAL COLLATERAL LIGAMENT RECONSTRUCTION Right 06/06/2019   Procedure: KNEE ARTHROSCOPY WITH MEDIAL COLLATERAL LIGAMENT RECONSTRUCTION;  Surgeon: Varkey, Dax T, MD;  Location:  [facility];  Service: Orthopedics;  Laterality: Right;   LUMBAR LAMINECTOMY/DECOMPRESSION MICRODISCECTOMY Right 09/16/2015   Procedure: Right - Lumbar five-sacral one  lumbar laminotomy and microdiscectomy;  Surgeon: Robert Nudelman, MD;  Location: [facility];  Service: Neurosurgery;  Laterality: Right;   OOPHORECTOMY  02/21/94   left   Patient Active Problem List   Diagnosis Date Noted   Seborrheic keratosis, inflamed 05/02/2017   HNP (herniated nucleus pulposus), lumbar 09/16/2015   Radicular pain of right lower extremity 07/24/2015   Calculus of gallbladder with acute cholecystitis without obstruction 06/20/2014   Abdominal pain 06/17/2014   Diarrhea 06/17/2014   Allergic rhinitis 09/08/2008   Anxiety disorder, unspecified 09/02/2008   HYPERLIPIDEMIA 02/18/2008   PERIPHERAL NEUROPATHY, MILD 02/06/2008   PERIMENOPAUSAL SYNDROME 02/06/2008   CARPAL TUNNEL SYNDROME, BILATERAL, HX OF 02/06/2008   ASTHMA 08/21/2007   SEBORRHEIC KERATOSIS 08/21/2007   DISC DISEASE, LUMBAR 07/25/2007   EXTRINSIC ASTHMA, UNSPECIFIED 05/04/2007    PCP: Dr Mary Baxley   REFERRING PROVIDER: Dr Steven Bokshan   REFERRING DIAG: S83.411A (ICD-10-CM) - Complete tear of medial collateral ligament of right knee, initial encounter  THERAPY DIAG:  Chronic pain of right knee  Other abnormalities of gait and mobility  Rationale for Evaluation and Treatment Rehabilitation  ONSET DATE:   SUBJECTIVE:   SUBJECTIVE STATEMENT: The patient had a right MCL   repair done in 2021. 6 months ago she was hiking. She had no fall or twist but aftwerwards she had significant swelling and pain in her knee. She had an MRI which showed the MCL had re-torn. She began to develop lateral knee pain as well. She has decided to proceed non-operatively. She has baseline knee OA. Per MD her best option would be a TKA. The patient is not ready for that yet. She had an injection which helped significantly.    PERTINENT  HISTORY: Anemia, anxiety, arthritis , Back Pain, hiatal hernia, numbness of right foot ( per chart)   PAIN:  Are you having pain? Yes: NPRS scale: 2/10 Pain location: lateral and medial knee at times  Pain description: aching  Aggravating factors: standing and walking particularly down hill.  Relieving factors: rest   PRECAUTIONS: None  WEIGHT BEARING RESTRICTIONS No  FALLS:  Has patient fallen in last 6 months? No  LIVING ENVIRONMENT: Lives with: lives with their family Lives in: House/apartment Stairs: Yes: External: 3 steps; can reach both Has following equipment at home: None  OCCUPATION:  Unknown     PLOF: Independent  PATIENT GOALS   To hold off on surgery. To be able to hike    OBJECTIVE:   DIAGNOSTIC FINDINGS: MRI from 2022: multi area cartilage loss. Complete MCL tear.   PATIENT SURVEYS:  FOTO 50% baseline 68 % expected in  13 visits   COGNITION:  Overall cognitive status: Within functional limits for tasks assessed     SENSATION: WFL  EDEMA:  No visible swelling   POSTURE: rounded shoulders bilateral knee valgus; bilateral high arches   PALPATION: TTP in the medial joint line and where the MCL injury is   LOWER EXTREMITY ROM:  Passive ROM Right eval Left eval  Hip flexion    Hip extension    Hip abduction    Hip adduction    Hip internal rotation    Hip external rotation    Knee flexion Full without pain    Knee extension    Ankle dorsiflexion    Ankle plantarflexion    Ankle inversion    Ankle eversion     (Blank rows = not tested)  LOWER EXTREMITY MMT:  MMT Right eval Left eval  Hip flexion 21.9 25.9  Hip extension    Hip abduction 23 36.8  Hip adduction    Hip internal rotation    Hip external rotation    Knee flexion    Knee extension 31.8 43.8  Ankle dorsiflexion    Ankle plantarflexion    Ankle inversion    Ankle eversion     (Blank rows = not tested)  LOWER EXTREMITY SPECIAL TESTS:    FUNCTIONAL TESTS:   5x sit to stand   GAIT: Distance walked:  Assistive device utilized: None Level of assistance: Complete Independence Comments: shoe more worn on the left lateral aspect liknely due to lateral shift away from the right side.    TODAY'S TREATMENT:   Exercises - Supine Active Straight Leg Raise  - 1 x daily - 7 x weekly - 3 sets - 10 reps - Supine Bridge  - 1 x daily - 7 x weekly - 3 sets - 10 reps - Side Stepping with Resistance at Thighs  - 1 x daily - 7 x weekly - 3 sets - 10 reps   PATIENT EDUCATION:  Education details: reviewed HEP and symptom management  Person educated: Patient Education method: Explanation Education comprehension: verbalized understanding  HOME EXERCISE PROGRAM: Access Code: [email or web address] URL: [email or web address] Date: 08/31/2021 Prepared by: Carolyne Littles   Exercises - Supine Active Straight Leg Raise  - 1 x daily - 7 x weekly - 3 sets - 10 reps - Supine Bridge  - 1 x daily - 7 x weekly - 3 sets - 10 reps - Side Stepping with Resistance at Thighs  - 1 x daily - 7 x weekly - 3 sets - 10 reps  ASSESSMENT:  CLINICAL IMPRESSION: Patient is a 66 y.o. female who was seen today for physical therapy evaluation and treatment for right medial MCL tear. She presents right leg weakness and pain in weight bearing. She has full ROM without pain. She is currently wearing a medial unloading brace which is helping. She would benefit from skilled therapy to improve right knee stability in standing and her ability to hike.    OBJECTIVE IMPAIRMENTS Abnormal gait, decreased activity tolerance, decreased endurance, decreased mobility, difficulty walking, decreased strength, and pain.   ACTIVITY LIMITATIONS carrying, lifting, bending, sitting, standing, squatting, stairs, locomotion level, and downhill walking   PARTICIPATION LIMITATIONS: cleaning, shopping, occupation, and yard work  PERSONAL FACTORS anxiety, arthritis , Back Pain are also affecting  patient's functional outcome.   REHAB POTENTIAL: Good  CLINICAL DECISION MAKING: Evolving/moderate complexity instability with weight bearing and walking   EVALUATION COMPLEXITY: Moderate   GOALS: Goals reviewed with patient? Yes  SHORT TERM GOALS: Target date: 09/22/2021  Patient will increase gross right LE strength by 5 lbs  Baseline: Goal status: INITIAL  2.  Patient will perform single leg stance for 20 seconds without pain and instability  Baseline:  Goal status: INITIAL  3.  Patient will be independent with base HEP  Baseline:  Goal status: INITIAL  LONG TERM GOALS: Target date: 10/13/2021   Patient will go down hills without increased pain  Baseline:  Goal status: INITIAL  2.  Patient will have a full strength and stability program  Baseline:  Goal status: INITIAL  3.  Patient will return to hiking without increased pain  Baseline:  Goal status: INITIAL    PLAN: PT FREQUENCY: 1x/week  PT DURATION: 6 weeks  PLANNED INTERVENTIONS: Therapeutic exercises, Therapeutic activity, Neuromuscular re-education, Balance training, Gait training, Patient/Family education, Joint mobilization, DME instructions, Aquatic Therapy, Dry Needling, Cryotherapy, Moist heat, Ultrasound, and Manual therapy  PLAN FOR NEXT SESSION: progress closed chain strengthening as tolerated. Consider a step up and step down. Progress HEP; modalities PRN   PHYSICAL THERAPY DISCHARGE SUMMARY  Visits from Start of Care: 1  Current functional level related to goals / functional outcomes: Unknown did not return    Remaining deficits: Unknown    Education / Equipment: Unknown    Patient agrees to discharge. Patient goals were not met. Patient is being discharged due to not returning since the last visit.   Carney Living, PT 08/31/2021, 2:59 PM  Mathew Hamlet SPT  08/31/2021  During this treatment session, the therapist was present, participating in and directing the treatment.

## 2021-09-01 ENCOUNTER — Encounter (HOSPITAL_BASED_OUTPATIENT_CLINIC_OR_DEPARTMENT_OTHER): Payer: Self-pay | Admitting: Physical Therapy

## 2021-09-06 NOTE — Discharge Instructions (Signed)

## 2021-09-08 ENCOUNTER — Ambulatory Visit
Admission: RE | Admit: 2021-09-08 | Discharge: 2021-09-08 | Disposition: A | Discharge status: Home / Self Care | Payer: 59 | Attending: Ophthalmology | Admitting: Ophthalmology

## 2021-09-08 ENCOUNTER — Other Ambulatory Visit: Payer: Self-pay

## 2021-09-08 ENCOUNTER — Encounter
Admission: RE | Disposition: A | Discharge status: Home / Self Care | Payer: Self-pay | Source: Home / Self Care | Attending: Ophthalmology

## 2021-09-08 ENCOUNTER — Ambulatory Visit: Payer: 59 | Admitting: Anesthesiology

## 2021-09-08 ENCOUNTER — Encounter: Payer: Self-pay | Admitting: Ophthalmology

## 2021-09-08 DIAGNOSIS — M1990 Unspecified osteoarthritis, unspecified site: Secondary | ICD-10-CM | POA: Diagnosis not present

## 2021-09-08 DIAGNOSIS — Z87891 Personal history of nicotine dependence: Secondary | ICD-10-CM | POA: Insufficient documentation

## 2021-09-08 DIAGNOSIS — H2511 Age-related nuclear cataract, right eye: Secondary | ICD-10-CM | POA: Insufficient documentation

## 2021-09-08 DIAGNOSIS — F32A Depression, unspecified: Secondary | ICD-10-CM | POA: Insufficient documentation

## 2021-09-08 DIAGNOSIS — R109 Unspecified abdominal pain: Secondary | ICD-10-CM

## 2021-09-08 DIAGNOSIS — F419 Anxiety disorder, unspecified: Secondary | ICD-10-CM | POA: Insufficient documentation

## 2021-09-08 DIAGNOSIS — K219 Gastro-esophageal reflux disease without esophagitis: Secondary | ICD-10-CM | POA: Insufficient documentation

## 2021-09-08 HISTORY — PX: CATARACT EXTRACTION W/PHACO: SHX586

## 2021-09-08 SURGERY — PHACOEMULSIFICATION, CATARACT, WITH IOL INSERTION
Anesthesia: Monitor Anesthesia Care | Site: Eye | Laterality: Right

## 2021-09-08 MED ORDER — BRIMONIDINE TARTRATE-TIMOLOL 0.2-0.5 % OP SOLN
OPHTHALMIC | Status: DC | PRN
  Administered 2021-09-08: 1 [drp] via OPHTHALMIC

## 2021-09-08 MED ORDER — FENTANYL CITRATE (PF) 100 MCG/2ML IJ SOLN
INTRAMUSCULAR | Status: DC | PRN
  Administered 2021-09-08: 50 ug via INTRAVENOUS

## 2021-09-08 MED ORDER — SIGHTPATH DOSE#1 BSS IO SOLN
INTRAOCULAR | Status: DC | PRN
  Administered 2021-09-08: 1 mL via INTRAMUSCULAR

## 2021-09-08 MED ORDER — SIGHTPATH DOSE#1 BSS IO SOLN
INTRAOCULAR | Status: DC | PRN
  Administered 2021-09-08: 65 mL via OPHTHALMIC

## 2021-09-08 MED ORDER — MIDAZOLAM HCL 2 MG/2ML IJ SOLN
INTRAMUSCULAR | Status: DC | PRN
  Administered 2021-09-08: 2 mg via INTRAVENOUS

## 2021-09-08 MED ORDER — MOXIFLOXACIN HCL 0.5 % OP SOLN: OPHTHALMIC | Status: DC | PRN

## 2021-09-08 MED ORDER — TETRACAINE HCL 0.5 % OP SOLN
1.0000 [drp] | OPHTHALMIC | Status: DC | PRN
  Administered 2021-09-08 (×3): 1 [drp] via OPHTHALMIC

## 2021-09-08 MED ORDER — SIGHTPATH DOSE#1 NA HYALUR & NA CHOND-NA HYALUR IO KIT
PACK | INTRAOCULAR | Status: DC | PRN
  Administered 2021-09-08: 1 via OPHTHALMIC

## 2021-09-08 MED ORDER — ARMC OPHTHALMIC DILATING DROPS
1.0000 | OPHTHALMIC | Status: DC | PRN
  Administered 2021-09-08 (×3): 1 via OPHTHALMIC

## 2021-09-08 MED ORDER — CEFUROXIME OPHTHALMIC INJECTION 1 MG/0.1 ML
INJECTION | OPHTHALMIC | Status: DC | PRN
  Administered 2021-09-08: 1 mg via OPHTHALMIC

## 2021-09-08 MED ORDER — SIGHTPATH DOSE#1 BSS IO SOLN
INTRAOCULAR | Status: DC | PRN
  Administered 2021-09-08: 15 mL

## 2021-09-08 SURGICAL SUPPLY — 11 items
CATARACT SUITE SIGHTPATH (MISCELLANEOUS) ×2 IMPLANT
FEE CATARACT SUITE SIGHTPATH (MISCELLANEOUS) ×1 IMPLANT
GLOVE SRG 8 PF TXTR STRL LF DI (GLOVE) ×1 IMPLANT
GLOVE SURG ENC TEXT LTX SZ7.5 (GLOVE) ×2 IMPLANT
GLOVE SURG UNDER POLY LF SZ8 (GLOVE) ×2
LENS IOL TECNIS EYHANCE 24.0 (Intraocular Lens) ×1 IMPLANT
NDL FILTER BLUNT 18X1 1/2 (NEEDLE) ×1 IMPLANT
NEEDLE FILTER BLUNT 18X 1/2SAF (NEEDLE) ×1
NEEDLE FILTER BLUNT 18X1 1/2 (NEEDLE) ×1 IMPLANT
SYR 3ML LL SCALE MARK (SYRINGE) ×2 IMPLANT
WATER STERILE IRR 250ML POUR (IV SOLUTION) ×2 IMPLANT

## 2021-09-08 NOTE — Transfer of Care (Signed)
Immediate Anesthesia Transfer of Care Note  Patient: Patricia Ferrell  Procedure(s) Performed: CATARACT EXTRACTION PHACO AND INTRAOCULAR LENS PLACEMENT (IOC) RIGHT (Right: Eye)  Patient Location: PACU  Anesthesia Type: MAC  Level of Consciousness: awake, alert  and patient cooperative  Airway and Oxygen Therapy: Patient Spontanous Breathing and Patient connected to supplemental oxygen  Post-op Assessment: Post-op Vital signs reviewed, Patient's Cardiovascular Status Stable, Respiratory Function Stable, Patent Airway and No signs of Nausea or vomiting  Post-op Vital Signs: Reviewed and stable  Complications: No notable events documented.

## 2021-09-08 NOTE — H&P (Signed)
[facility]   Primary Care Physician:  Lenord Fellers Luanna Cole, MD Ophthalmologist: Dr. Lockie Mola  Pre-Procedure History & Physical: HPI:  Patricia Ferrell is a 66 y.o. female here for ophthalmic surgery.   Past Medical History:  Diagnosis Date   Anemia    years ago   Anxiety    Arthritis    Asthma    bronchitis   Back pain    lumbar   Carpal tunnel syndrome    Complication of anesthesia    Depression    Family history of adverse reaction to anesthesia    mother's blood pressure always drops    GERD (gastroesophageal reflux disease)    History of hiatal hernia    Hyperlipidemia    Numbness of right foot    Perimenopausal    PONV (postoperative nausea and vomiting)    Vitamin D deficiency     Past Surgical History:  Procedure Laterality Date   ABDOMINAL HYSTERECTOMY     DUB   CHOLECYSTECTOMY  06/19/2014   CHOLECYSTECTOMY N/A 06/19/2014   Procedure: LAPAROSCOPIC CHOLECYSTECTOMY WITH INTRAOPERATIVE CHOLANGIOGRAM;  Surgeon: Manus Rudd, MD;  Location: [facility];  Service: General;  Laterality: N/A;   CHONDROPLASTY Right 06/06/2019   Procedure: KNEE ARTHROSCOPY WITH CHONDROPLASTY;  Surgeon: Bjorn Pippin, MD;  Location: [facility];  Service: Orthopedics;  Laterality: Right;   COLONOSCOPY     KNEE ARTHROSCOPY WITH LATERAL MENISECTOMY Right 06/06/2019   Procedure: KNEE ARTHROSCOPY WITH LATERAL MENISECTOMY;  Surgeon: Bjorn Pippin, MD;  Location: [facility];  Service: Orthopedics;  Laterality: Right;   KNEE ARTHROSCOPY WITH MEDIAL COLLATERAL LIGAMENT RECONSTRUCTION Right 06/06/2019   Procedure: KNEE ARTHROSCOPY WITH MEDIAL COLLATERAL LIGAMENT RECONSTRUCTION;  Surgeon: Bjorn Pippin, MD;  Location: [facility];  Service: Orthopedics;  Laterality: Right;   LUMBAR LAMINECTOMY/DECOMPRESSION MICRODISCECTOMY Right 09/16/2015   Procedure: Right - Lumbar five-sacral one  lumbar laminotomy and microdiscectomy;  Surgeon: Shirlean Kelly,  MD;  Location: [facility];  Service: Neurosurgery;  Laterality: Right;   OOPHORECTOMY  02/21/94   left    Prior to Admission medications   Medication Sig Start Date End Date Taking? Authorizing Provider  ALPRAZolam (XANAX) 0.5 MG tablet TAKE 1 TABLET BY MOUTH TWICE DAILY AS NEEDED 11/16/20  Yes Baxley, Luanna Cole, MD  buPROPion (WELLBUTRIN XL) 300 MG 24 hr tablet TAKE 1 TABLET BY MOUTH DAILY 08/05/21  Yes Baxley, Luanna Cole, MD  Calcium Carbonate-Vit D-Min (CALCIUM 1200 PO) calcium   Yes [provider]  COLLAGEN PO Take 1 Scoop by mouth daily after breakfast.   Yes [provider]  Cyanocobalamin (B-12 PO) B12   Yes [provider]  diclofenac (VOLTAREN) 75 MG EC tablet Take 1 tablet (75 mg total) by mouth 2 (two) times daily. 07/16/21  Yes Huel Cote, MD  ELDERBERRY PO Elderberry   Yes [provider]  famotidine (PEPCID) 20 MG tablet TAKE 1 TABLET BY MOUTH TWICE DAILY 05/10/21  Yes Baxley, Luanna Cole, MD  Multiple Vitamin (MULTIVITAMIN ADULT PO) multivitamin   Yes [provider]  traMADol (ULTRAM) 50 MG tablet Take 1 tablet (50 mg total) by mouth every 6 (six) hours as needed. 08/20/21  Yes Huel Cote, MD  gabapentin (NEURONTIN) 100 MG capsule Take 1-3 capsules (100-300 mg total) by mouth 3 (three) times daily. 11/13/19 02/17/20  Tarry Kos, MD    Allergies as of 08/19/2021 - Review Complete 07/12/2021  Allergen Reaction Noted   Hydrocodone Nausea  And Vomiting 05/29/2019   Oxycodone  06/06/2019   Codeine Nausea And Vomiting 02/27/2008    Family History  Problem Relation Age of Onset   Coronary artery disease Mother    Heart attack Mother    Hypertension Mother    Colon polyps Father    Colon cancer Other     Social History   Socioeconomic History   Marital status: Divorced    Spouse name: Not on file   Number of children: 1   Years of education: 12   Highest education level: Not on file  Occupational History   Occupation:  special needs care  Tobacco Use   Smoking status: Former    Types: Cigarettes    Quit date: 02/21/1988    Years since quitting: 33.5   Smokeless tobacco: Never  Vaping Use   Vaping Use: Never used  Substance and Sexual Activity   Alcohol use: Yes    Alcohol/week: 0.0 standard drinks of alcohol    Comment: occasional   Drug use: No   Sexual activity: Never  Other Topics Concern   Not on file  Social History Narrative   One story home   One child   Decaf mostly    Right handed    Family history: History of hypertension osteoporosis heart disease thyroid disorder and lung tumor as well as COPD in mother.  Father with history of hypertension heart disease and arthritis.       Social Determinants of Health   Financial Resource Strain: Not on file  Food Insecurity: Not on file  Transportation Needs: Not on file  Physical Activity: Not on file  Stress: Not on file  Social Connections: Not on file  Intimate Partner Violence: Not on file    Review of Systems: See HPI, otherwise negative ROS  Physical Exam: BP 139/82   Pulse 77   Temp (!) 97.1 F (36.2 C) (Temporal)   Wt 76.2 kg   SpO2 98%   BMI 27.96 kg/m  General:   Alert,  pleasant and cooperative in NAD Head:  Normocephalic and atraumatic. Lungs:  Clear to auscultation.    Heart:  Regular rate and rhythm.   Impression/Plan: Patricia Ferrell is here for ophthalmic surgery.  Risks, benefits, limitations, and alternatives regarding ophthalmic surgery have been reviewed with the patient.  Questions have been answered.  All parties agreeable.   Lockie Mola, MD  09/08/2021, 11:28 AM

## 2021-09-08 NOTE — Anesthesia Preprocedure Evaluation (Signed)
Anesthesia Evaluation  Patient identified by MRN, date of birth, ID band Patient awake    Reviewed: Allergy & Precautions, NPO status , Patient's Chart, lab work & pertinent test results  History of Anesthesia Complications (+) PONV and history of anesthetic complications  Airway Mallampati: II  TM Distance: >3 FB Neck ROM: Full    Dental no notable dental hx.    Pulmonary asthma , former smoker,    Pulmonary exam normal        Cardiovascular negative cardio ROS Normal cardiovascular exam     Neuro/Psych Anxiety Depression negative neurological ROS     GI/Hepatic GERD  Controlled,  Endo/Other  negative endocrine ROS  Renal/GU negative Renal ROS     Musculoskeletal  (+) Arthritis ,   Abdominal Normal abdominal exam  (+)   Peds  Hematology negative hematology ROS (+)   Anesthesia Other Findings   Reproductive/Obstetrics                             Anesthesia Physical Anesthesia Plan  ASA: 2  Anesthesia Plan: MAC   Post-op Pain Management: Minimal or no pain anticipated   Induction: Intravenous  PONV Risk Score and Plan: 3 and Midazolam, TIVA and Treatment may vary due to age or medical condition  Airway Management Planned: Natural Airway and Nasal Cannula  Additional Equipment:   Intra-op Plan:   Post-operative Plan:   Informed Consent: I have reviewed the patients History and Physical, chart, labs and discussed the procedure including the risks, benefits and alternatives for the proposed anesthesia with the patient or authorized representative who has indicated his/her understanding and acceptance.     Dental advisory given  Plan Discussed with: CRNA  Anesthesia Plan Comments:         Anesthesia Quick Evaluation

## 2021-09-08 NOTE — Op Note (Signed)
LOCATION:  [facility]   PREOPERATIVE DIAGNOSIS:    Nuclear sclerotic cataract right eye. H25.11   POSTOPERATIVE DIAGNOSIS:  Nuclear sclerotic cataract right eye.     PROCEDURE:  Phacoemusification with posterior chamber intraocular lens placement of the right eye   ULTRASOUND TIME: Procedure(s) with comments: CATARACT EXTRACTION PHACO AND INTRAOCULAR LENS PLACEMENT (IOC) RIGHT (Right) - 5.05 00:55.6  LENS:   Implant Name Type Inv. Item Serial No. Manufacturer Lot No. LRB No. Used Action  LENS IOL TECNIS EYHANCE 24.0 - [id number] Intraocular Lens LENS IOL TECNIS EYHANCE 24.0 [id number] [employer]  Right 1 Implanted         SURGEON:  Deirdre Evener, MD   ANESTHESIA:  Topical with tetracaine drops and 2% Xylocaine jelly, augmented with 1% preservative-free intracameral lidocaine.    COMPLICATIONS:  None.   DESCRIPTION OF PROCEDURE:  The patient was identified in the holding room and transported to the operating room and placed in the supine position under the operating microscope.  The right eye was identified as the operative eye and it was prepped and draped in the usual sterile ophthalmic fashion.   A 1 millimeter clear-corneal paracentesis was made at the 12:00 position.  0.5 ml of preservative-free 1% lidocaine was injected into the anterior chamber. The anterior chamber was filled with Viscoat viscoelastic.  A 2.4 millimeter keratome was used to make a near-clear corneal incision at the 9:00 position.  A curvilinear capsulorrhexis was made with a cystotome and capsulorrhexis forceps.  Balanced salt solution was used to hydrodissect and hydrodelineate the nucleus.   Phacoemulsification was then used in stop and chop fashion to remove the lens nucleus and epinucleus.  The remaining cortex was then removed using the irrigation and aspiration handpiece. Provisc was then placed into the capsular bag to distend it for lens placement.  A lens was then injected into the  capsular bag.  The remaining viscoelastic was aspirated.   Wounds were hydrated with balanced salt solution.  The anterior chamber was inflated to a physiologic pressure with balanced salt solution.  No wound leaks were noted. Cefuroxime 0.1 ml of a 10mg /ml solution was injected into the anterior chamber for a dose of 1 mg of intracameral antibiotic at the completion of the case.   Timolol and Brimonidine drops were applied to the eye.  The patient was taken to the recovery room in stable condition without complications of anesthesia or surgery.   Alira Fretwell 09/08/2021, 12:17 PM

## 2021-09-08 NOTE — Anesthesia Postprocedure Evaluation (Signed)
Anesthesia Post Note  Patient: Patricia Ferrell  Procedure(s) Performed: CATARACT EXTRACTION PHACO AND INTRAOCULAR LENS PLACEMENT (IOC) RIGHT (Right: Eye)     Patient location during evaluation: PACU Anesthesia Type: MAC Level of consciousness: awake and alert Pain management: pain level controlled Vital Signs Assessment: post-procedure vital signs reviewed and stable Respiratory status: spontaneous breathing and nonlabored ventilation Cardiovascular status: blood pressure returned to baseline Postop Assessment: no apparent nausea or vomiting Anesthetic complications: no   No notable events documented.  Maddelyn Rocca [employer]

## 2021-09-09 ENCOUNTER — Other Ambulatory Visit: Payer: Self-pay

## 2021-09-09 ENCOUNTER — Encounter: Payer: Self-pay | Admitting: Ophthalmology

## 2021-09-13 ENCOUNTER — Other Ambulatory Visit: Payer: 59

## 2021-09-13 DIAGNOSIS — E782 Mixed hyperlipidemia: Secondary | ICD-10-CM

## 2021-09-14 ENCOUNTER — Encounter: Payer: Self-pay | Admitting: Internal Medicine

## 2021-09-14 ENCOUNTER — Ambulatory Visit (INDEPENDENT_AMBULATORY_CARE_PROVIDER_SITE_OTHER): Payer: 59 | Admitting: Internal Medicine

## 2021-09-14 VITALS — BP 128/82 | HR 82 | Temp 97.8°F | Ht 65.0 in | Wt 168.8 lb

## 2021-09-14 DIAGNOSIS — Z7185 Encounter for immunization safety counseling: Secondary | ICD-10-CM | POA: Diagnosis not present

## 2021-09-14 DIAGNOSIS — F419 Anxiety disorder, unspecified: Secondary | ICD-10-CM | POA: Diagnosis not present

## 2021-09-14 DIAGNOSIS — Z9841 Cataract extraction status, right eye: Secondary | ICD-10-CM | POA: Diagnosis not present

## 2021-09-14 DIAGNOSIS — Z23 Encounter for immunization: Secondary | ICD-10-CM

## 2021-09-14 DIAGNOSIS — F32A Depression, unspecified: Secondary | ICD-10-CM

## 2021-09-14 DIAGNOSIS — E782 Mixed hyperlipidemia: Secondary | ICD-10-CM

## 2021-09-14 DIAGNOSIS — Z961 Presence of intraocular lens: Secondary | ICD-10-CM

## 2021-09-14 LAB — LIPID PANEL
Cholesterol: 261 mg/dL — ABNORMAL HIGH (ref <200)
HDL: 74 mg/dL (ref >=50)
LDL Cholesterol (Calc): 153 mg/dL (calc) — ABNORMAL HIGH
Non-HDL Cholesterol (Calc): 187 mg/dL (calc) — ABNORMAL HIGH (ref <130)
Total CHOL/HDL Ratio: 3.5 (calc) (ref <5.0)
Triglycerides: 200 mg/dL — ABNORMAL HIGH (ref <150)

## 2021-09-14 NOTE — Patient Instructions (Addendum)
Will return in 6 months for CPE and fasting labs. Sees GYN in December. Tdap given today. Labs stable and improved. Does not want to be on statin. Coronary calcium  study ordered.

## 2021-09-14 NOTE — Progress Notes (Signed)
6 month follow up  Subjective:    Patient ID: Patricia Ferrell, female    DOB: [birth year], 66 y.o.   MRN: [id number]  HPI 66 year old Female for 42-month follow-up.  History of mixed hyperlipidemia.  Has not wanted to be on statin medication.  In January total cholesterol was 219 and is now up to 57.  However her good (HDL) cholesterol has increased from 39 to 69.  Also her triglycerides have decreased from 1 88-1 09.  However her LDL has increased from 148 to 165 which is her bad cholesterol.  Says she may have not been eating as well recently.  She is doing well on Wellbutrin and Xanax for anxiety and depression.  Had GYN exam by Dr. Mitchel Honour in December 2022.  Had right knee arthroscopic surgery in June 2023 confirming peripheral tear of lateral meniscus right knee by Dr. Steward Drone, orthopedic surgeon.  Vaccines discussed.  Tetanus update given today.  She does not want Prevnar 20, COVID-vaccine or Shingrix vaccine at this time.  Had mammogram in December 2022 per GYN.      Review of Systems Had cataract surgery July 19 right eye and left eye planned for early August by Dr. Inez Pilgrim at [facility].     Objective:   Physical Exam  Vital signs reviewed.  Blood pressure 128/82.  Pulse oximetry 97%.  Weight 168 pounds 12 ounces BMI 28.08.  In January her weight was 166 pounds 8 ounces and BMI was 27.71. Neck is supple without JVD thyromegaly or carotid bruits.  Chest is clear to auscultation without rales.  Cardiac exam: Regular rate and rhythm without murmurs or gallops or ectopy.  No lower extremity pitting edema.  Affect thought and judgment appear to be normal.     Assessment & Plan:  Pure hypercholesterolemia-does not want to be on statin medication at this time but agrees to coronary calcium scoring.  This will be ordered to [facility] location.  Anxiety depression stable on Wellbutrin and Xanax  Status post right cataract extraction and doing well  History of GE  reflux treated with Pepcid twice daily  Knee pain treated with Voltaren per Dr. Steward Drone  Health maintenance-tetanus update given today  Plan: Return in 6 months health maintenance exam and fasting labs.  Coronary calcium score ordered.

## 2021-09-20 NOTE — Discharge Instructions (Signed)

## 2021-09-22 ENCOUNTER — Ambulatory Visit
Admission: RE | Admit: 2021-09-22 | Discharge: 2021-09-22 | Disposition: A | Discharge status: Home / Self Care | Payer: 59 | Attending: Ophthalmology | Admitting: Ophthalmology

## 2021-09-22 ENCOUNTER — Encounter
Admission: RE | Disposition: A | Discharge status: Home / Self Care | Payer: Self-pay | Source: Home / Self Care | Attending: Ophthalmology

## 2021-09-22 ENCOUNTER — Encounter: Payer: Self-pay | Admitting: Ophthalmology

## 2021-09-22 ENCOUNTER — Ambulatory Visit (AMBULATORY_SURGERY_CENTER): Payer: 59 | Admitting: Anesthesiology

## 2021-09-22 ENCOUNTER — Ambulatory Visit: Payer: 59 | Admitting: Anesthesiology

## 2021-09-22 DIAGNOSIS — I10 Essential (primary) hypertension: Secondary | ICD-10-CM | POA: Diagnosis not present

## 2021-09-22 DIAGNOSIS — Z87891 Personal history of nicotine dependence: Secondary | ICD-10-CM | POA: Diagnosis not present

## 2021-09-22 DIAGNOSIS — E119 Type 2 diabetes mellitus without complications: Secondary | ICD-10-CM | POA: Diagnosis not present

## 2021-09-22 DIAGNOSIS — H2512 Age-related nuclear cataract, left eye: Secondary | ICD-10-CM

## 2021-09-22 HISTORY — PX: CATARACT EXTRACTION W/PHACO: SHX586

## 2021-09-22 SURGERY — PHACOEMULSIFICATION, CATARACT, WITH IOL INSERTION
Anesthesia: Monitor Anesthesia Care | Site: Eye | Laterality: Left

## 2021-09-22 MED ORDER — SIGHTPATH DOSE#1 BSS IO SOLN
INTRAOCULAR | Status: DC | PRN
  Administered 2021-09-22: 1 mL via INTRAMUSCULAR

## 2021-09-22 MED ORDER — BRIMONIDINE TARTRATE-TIMOLOL 0.2-0.5 % OP SOLN
OPHTHALMIC | Status: DC | PRN
  Administered 2021-09-22: 1 [drp] via OPHTHALMIC

## 2021-09-22 MED ORDER — ONDANSETRON HCL 4 MG/2ML IJ SOLN
INTRAMUSCULAR | Status: DC | PRN
  Administered 2021-09-22: 4 mg via INTRAVENOUS

## 2021-09-22 MED ORDER — CEFUROXIME OPHTHALMIC INJECTION 1 MG/0.1 ML
INJECTION | OPHTHALMIC | Status: DC | PRN
  Administered 2021-09-22: 0.1 mL via INTRACAMERAL

## 2021-09-22 MED ORDER — SIGHTPATH DOSE#1 NA HYALUR & NA CHOND-NA HYALUR IO KIT
PACK | INTRAOCULAR | Status: DC | PRN
  Administered 2021-09-22: 1 via OPHTHALMIC

## 2021-09-22 MED ORDER — FENTANYL CITRATE (PF) 100 MCG/2ML IJ SOLN
INTRAMUSCULAR | Status: DC | PRN
Start: 2021-09-22 — End: 2021-09-22
  Administered 2021-09-22 (×2): 50 ug via INTRAVENOUS

## 2021-09-22 MED ORDER — SIGHTPATH DOSE#1 BSS IO SOLN
INTRAOCULAR | Status: DC | PRN
  Administered 2021-09-22: 60 mL via OPHTHALMIC

## 2021-09-22 MED ORDER — LACTATED RINGERS IV SOLN: INTRAVENOUS | Status: DC

## 2021-09-22 MED ORDER — ARMC OPHTHALMIC DILATING DROPS
1.0000 | OPHTHALMIC | Status: DC | PRN
  Administered 2021-09-22 (×3): 1 via OPHTHALMIC

## 2021-09-22 MED ORDER — SIGHTPATH DOSE#1 BSS IO SOLN
INTRAOCULAR | Status: DC | PRN
  Administered 2021-09-22: 15 mL

## 2021-09-22 MED ORDER — TETRACAINE HCL 0.5 % OP SOLN
1.0000 [drp] | OPHTHALMIC | Status: DC | PRN
  Administered 2021-09-22 (×3): 1 [drp] via OPHTHALMIC

## 2021-09-22 MED ORDER — MIDAZOLAM HCL 2 MG/2ML IJ SOLN
INTRAMUSCULAR | Status: DC | PRN
  Administered 2021-09-22: 2 mg via INTRAVENOUS

## 2021-09-22 SURGICAL SUPPLY — 11 items
CATARACT SUITE SIGHTPATH (MISCELLANEOUS) ×2 IMPLANT
FEE CATARACT SUITE SIGHTPATH (MISCELLANEOUS) ×1 IMPLANT
GLOVE SRG 8 PF TXTR STRL LF DI (GLOVE) ×1 IMPLANT
GLOVE SURG ENC TEXT LTX SZ7.5 (GLOVE) ×2 IMPLANT
GLOVE SURG UNDER POLY LF SZ8 (GLOVE) ×2
LENS IOL TECNIS EYHANCE 24.0 (Intraocular Lens) ×1 IMPLANT
NDL FILTER BLUNT 18X1 1/2 (NEEDLE) ×1 IMPLANT
NEEDLE FILTER BLUNT 18X 1/2SAF (NEEDLE) ×1
NEEDLE FILTER BLUNT 18X1 1/2 (NEEDLE) ×1 IMPLANT
SYR 3ML LL SCALE MARK (SYRINGE) ×2 IMPLANT
WATER STERILE IRR 250ML POUR (IV SOLUTION) ×2 IMPLANT

## 2021-09-22 NOTE — Transfer of Care (Signed)
Immediate Anesthesia Transfer of Care Note  Patient: Patricia Ferrell  Procedure(s) Performed: CATARACT EXTRACTION PHACO AND INTRAOCULAR LENS PLACEMENT (IOC) LEFT 5.87 00:55.5 (Left: Eye)  Patient Location: PACU  Anesthesia Type:MAC  Level of Consciousness: awake, alert  and oriented  Airway & Oxygen Therapy: Patient Spontanous Breathing  Post-op Assessment: Report given to RN and Post -op Vital signs reviewed and stable  Post vital signs: Reviewed  Last Vitals:  Vitals Value Taken Time  BP    Temp    Pulse    Resp    SpO2      Last Pain:  Vitals:   09/22/21 0735  TempSrc: Temporal  PainSc: 0-No pain         Complications: No notable events documented.

## 2021-09-22 NOTE — Anesthesia Preprocedure Evaluation (Signed)
Anesthesia Evaluation  Patient identified by MRN, date of birth, ID band Patient awake    Reviewed: Allergy & Precautions, H&P , NPO status , Patient's Chart, lab work & pertinent test results, reviewed documented beta blocker date and time   History of Anesthesia Complications (+) PONV, Family history of anesthesia reaction and history of anesthetic complications  Airway Mallampati: II  TM Distance: >3 FB Neck ROM: full    Dental no notable dental hx. (+) Teeth Intact   Pulmonary asthma , former smoker,    Pulmonary exam normal breath sounds clear to auscultation       Cardiovascular Exercise Tolerance: Good hypertension, On Medications negative cardio ROS   Rhythm:regular Rate:Normal     Neuro/Psych PSYCHIATRIC DISORDERS Anxiety Depression  Neuromuscular disease    GI/Hepatic Neg liver ROS, hiatal hernia, GERD  ,  Endo/Other  negative endocrine ROSdiabetes  Renal/GU      Musculoskeletal   Abdominal   Peds  Hematology  (+) Blood dyscrasia, anemia ,   Anesthesia Other Findings   Reproductive/Obstetrics negative OB ROS                             Anesthesia Physical Anesthesia Plan  ASA: 3  Anesthesia Plan: MAC   Post-op Pain Management:    Induction:   PONV Risk Score and Plan:   Airway Management Planned:   Additional Equipment:   Intra-op Plan:   Post-operative Plan:   Informed Consent: I have reviewed the patients History and Physical, chart, labs and discussed the procedure including the risks, benefits and alternatives for the proposed anesthesia with the patient or authorized representative who has indicated his/her understanding and acceptance.       Plan Discussed with: CRNA  Anesthesia Plan Comments:         Anesthesia Quick Evaluation

## 2021-09-22 NOTE — Op Note (Signed)
OPERATIVE NOTE  [place] Kirchner [id number] 09/22/2021   PREOPERATIVE DIAGNOSIS:  Nuclear sclerotic cataract left eye. H25.12   POSTOPERATIVE DIAGNOSIS:    Nuclear sclerotic cataract left eye.     PROCEDURE:  Phacoemusification with posterior chamber intraocular lens placement of the left eye  Ultrasound time: Procedure(s): CATARACT EXTRACTION PHACO AND INTRAOCULAR LENS PLACEMENT (IOC) LEFT 5.87 00:55.5 (Left)  LENS:   Implant Name Type Inv. Item Serial No. Manufacturer Lot No. LRB No. Used Action  LENS IOL TECNIS EYHANCE 24.0 - [id number] Intraocular Lens LENS IOL TECNIS EYHANCE 24.0 [id number] [employer]  Left 1 Implanted      SURGEON:  Deirdre Evener, MD   ANESTHESIA:  Topical with tetracaine drops and 2% Xylocaine jelly, augmented with 1% preservative-free intracameral lidocaine.    COMPLICATIONS:  None.   DESCRIPTION OF PROCEDURE:  The patient was identified in the holding room and transported to the operating room and placed in the supine position under the operating microscope.  The left eye was identified as the operative eye and it was prepped and draped in the usual sterile ophthalmic fashion.   A 1 millimeter clear-corneal paracentesis was made at the 1:30 position.  0.5 ml of preservative-free 1% lidocaine was injected into the anterior chamber.  The anterior chamber was filled with Viscoat viscoelastic.  A 2.4 millimeter keratome was used to make a near-clear corneal incision at the 10:30 position.  .  A curvilinear capsulorrhexis was made with a cystotome and capsulorrhexis forceps.  Balanced salt solution was used to hydrodissect and hydrodelineate the nucleus.   Phacoemulsification was then used in stop and chop fashion to remove the lens nucleus and epinucleus.  The remaining cortex was then removed using the irrigation and aspiration handpiece. Provisc was then placed into the capsular bag to distend it for lens placement.  A lens was then injected into the  capsular bag.  The remaining viscoelastic was aspirated.   Wounds were hydrated with balanced salt solution.  The anterior chamber was inflated to a physiologic pressure with balanced salt solution.  No wound leaks were noted. Cefuroxime 0.1 ml of a 10mg /ml solution was injected into the anterior chamber for a dose of 1 mg of intracameral antibiotic at the completion of the case.   Timolol and Brimonidine drops were applied to the eye.  The patient was taken to the recovery room in stable condition without complications of anesthesia or surgery.  Auryn Paige 09/22/2021, 8:36 AM

## 2021-09-22 NOTE — H&P (Signed)
[facility]   Primary Care Physician:  Lenord Fellers Luanna Cole, MD Ophthalmologist: Dr. Lockie Mola  Pre-Procedure History & Physical: HPI:  Patricia Ferrell is a 66 y.o. female here for ophthalmic surgery.   Past Medical History:  Diagnosis Date   Anemia    years ago   Anxiety    Arthritis    Asthma    bronchitis   Back pain    lumbar   Carpal tunnel syndrome    Complication of anesthesia    Depression    Family history of adverse reaction to anesthesia    mother's blood pressure always drops    GERD (gastroesophageal reflux disease)    History of hiatal hernia    Hyperlipidemia    Numbness of right foot    Perimenopausal    PONV (postoperative nausea and vomiting)    Vitamin D deficiency     Past Surgical History:  Procedure Laterality Date   ABDOMINAL HYSTERECTOMY     DUB   CATARACT EXTRACTION W/PHACO Right 09/08/2021   Procedure: CATARACT EXTRACTION PHACO AND INTRAOCULAR LENS PLACEMENT (IOC) RIGHT;  Surgeon: Lockie Mola, MD;  Location: [facility];  Service: Ophthalmology;  Laterality: Right;  5.05 00:55.6   CHOLECYSTECTOMY  06/19/2014   CHOLECYSTECTOMY N/A 06/19/2014   Procedure: LAPAROSCOPIC CHOLECYSTECTOMY WITH INTRAOPERATIVE CHOLANGIOGRAM;  Surgeon: Manus Rudd, MD;  Location: [facility];  Service: General;  Laterality: N/A;   CHONDROPLASTY Right 06/06/2019   Procedure: KNEE ARTHROSCOPY WITH CHONDROPLASTY;  Surgeon: Bjorn Pippin, MD;  Location: [facility];  Service: Orthopedics;  Laterality: Right;   COLONOSCOPY     KNEE ARTHROSCOPY WITH LATERAL MENISECTOMY Right 06/06/2019   Procedure: KNEE ARTHROSCOPY WITH LATERAL MENISECTOMY;  Surgeon: Bjorn Pippin, MD;  Location: [facility];  Service: Orthopedics;  Laterality: Right;   KNEE ARTHROSCOPY WITH MEDIAL COLLATERAL LIGAMENT RECONSTRUCTION Right 06/06/2019   Procedure: KNEE ARTHROSCOPY WITH MEDIAL COLLATERAL LIGAMENT RECONSTRUCTION;  Surgeon: Bjorn Pippin, MD;   Location: [facility];  Service: Orthopedics;  Laterality: Right;   LUMBAR LAMINECTOMY/DECOMPRESSION MICRODISCECTOMY Right 09/16/2015   Procedure: Right - Lumbar five-sacral one  lumbar laminotomy and microdiscectomy;  Surgeon: Shirlean Kelly, MD;  Location: [facility];  Service: Neurosurgery;  Laterality: Right;   OOPHORECTOMY  02/21/94   left    Prior to Admission medications   Medication Sig Start Date End Date Taking? Authorizing Provider  ALPRAZolam (XANAX) 0.5 MG tablet TAKE 1 TABLET BY MOUTH TWICE DAILY AS NEEDED 11/16/20  Yes Baxley, Luanna Cole, MD  buPROPion (WELLBUTRIN XL) 300 MG 24 hr tablet TAKE 1 TABLET BY MOUTH DAILY 08/05/21  Yes Baxley, Luanna Cole, MD  Calcium Carbonate-Vit D-Min (CALCIUM 1200 PO) calcium   Yes [provider]  COLLAGEN PO Take 1 Scoop by mouth daily after breakfast.   Yes [provider]  Cyanocobalamin (B-12 PO) B12   Yes [provider]  diclofenac (VOLTAREN) 75 MG EC tablet Take 1 tablet (75 mg total) by mouth 2 (two) times daily. 07/16/21  Yes Huel Cote, MD  ELDERBERRY PO Elderberry   Yes [provider]  famotidine (PEPCID) 20 MG tablet TAKE 1 TABLET BY MOUTH TWICE DAILY 05/10/21  Yes Baxley, Luanna Cole, MD  Multiple Vitamin (MULTIVITAMIN ADULT PO) multivitamin   Yes [provider]  traMADol (ULTRAM) 50 MG tablet Take 1 tablet (50 mg total) by mouth every 6 (six) hours as needed. 08/20/21  Yes Huel Cote, MD  gabapentin (NEURONTIN) 100 MG capsule  Take 1-3 capsules (100-300 mg total) by mouth 3 (three) times daily. 11/13/19 02/17/20  Tarry Kos, MD    Allergies as of 08/19/2021 - Review Complete 07/12/2021  Allergen Reaction Noted   Hydrocodone Nausea And Vomiting 05/29/2019   Oxycodone  06/06/2019   Codeine Nausea And Vomiting 02/27/2008    Family History  Problem Relation Age of Onset   Coronary artery disease Mother    Heart attack Mother    Hypertension Mother    Colon polyps Father     Colon cancer Other     Social History   Socioeconomic History   Marital status: Divorced    Spouse name: Not on file   Number of children: 1   Years of education: 12   Highest education level: Not on file  Occupational History   Occupation: special needs care  Tobacco Use   Smoking status: Former    Types: Cigarettes    Quit date: 02/21/1988    Years since quitting: 33.6   Smokeless tobacco: Never  Vaping Use   Vaping Use: Never used  Substance and Sexual Activity   Alcohol use: Yes    Alcohol/week: 0.0 standard drinks of alcohol    Comment: occasional   Drug use: No   Sexual activity: Never  Other Topics Concern   Not on file  Social History Narrative   One story home   One child   Decaf mostly    Right handed    Family history: History of hypertension osteoporosis heart disease thyroid disorder and lung tumor as well as COPD in mother.  Father with history of hypertension heart disease and arthritis.       Social Determinants of Health   Financial Resource Strain: Not on file  Food Insecurity: Not on file  Transportation Needs: Not on file  Physical Activity: Not on file  Stress: Not on file  Social Connections: Not on file  Intimate Partner Violence: Not on file    Review of Systems: See HPI, otherwise negative ROS  Physical Exam: BP (!) 134/91   Pulse 76   Temp 97.7 F (36.5 C) (Temporal)   Resp 15   Wt 76 kg   SpO2 98%   BMI 27.88 kg/m  General:   Alert,  pleasant and cooperative in NAD Head:  Normocephalic and atraumatic. Lungs:  Clear to auscultation.    Heart:  Regular rate and rhythm.   Impression/Plan: Patricia Ferrell is here for ophthalmic surgery.  Risks, benefits, limitations, and alternatives regarding ophthalmic surgery have been reviewed with the patient.  Questions have been answered.  All parties agreeable.   Lockie Mola, MD  09/22/2021, 8:09 AM

## 2021-09-22 NOTE — Anesthesia Postprocedure Evaluation (Signed)
Anesthesia Post Note  Patient: Patricia Ferrell  Procedure(s) Performed: CATARACT EXTRACTION PHACO AND INTRAOCULAR LENS PLACEMENT (IOC) LEFT 5.87 00:55.5 (Left: Eye)     Patient location during evaluation: PACU Anesthesia Type: MAC Level of consciousness: awake and alert Pain management: pain level controlled Vital Signs Assessment: post-procedure vital signs reviewed and stable Respiratory status: spontaneous breathing, nonlabored ventilation, respiratory function stable and patient connected to nasal cannula oxygen Cardiovascular status: blood pressure returned to baseline and stable Postop Assessment: no apparent nausea or vomiting Anesthetic complications: no   No notable events documented.  Molli Barrows

## 2021-09-23 ENCOUNTER — Encounter: Payer: Self-pay | Admitting: Ophthalmology

## 2021-10-05 ENCOUNTER — Other Ambulatory Visit (HOSPITAL_BASED_OUTPATIENT_CLINIC_OR_DEPARTMENT_OTHER): Payer: 59

## 2021-10-08 ENCOUNTER — Telehealth (INDEPENDENT_AMBULATORY_CARE_PROVIDER_SITE_OTHER): Payer: 59 | Admitting: Internal Medicine

## 2021-10-08 ENCOUNTER — Encounter: Payer: Self-pay | Admitting: Internal Medicine

## 2021-10-08 VITALS — Temp 97.0°F | Wt 168.0 lb

## 2021-10-08 DIAGNOSIS — F419 Anxiety disorder, unspecified: Secondary | ICD-10-CM

## 2021-10-08 DIAGNOSIS — F32A Depression, unspecified: Secondary | ICD-10-CM

## 2021-10-08 DIAGNOSIS — U071 COVID-19: Secondary | ICD-10-CM | POA: Diagnosis not present

## 2021-10-08 MED ORDER — ONDANSETRON HCL 4 MG PO TABS: 4.0000 mg | ORAL_TABLET | Freq: Three times a day (TID) | ORAL | 0 refills | Status: AC | PRN

## 2021-10-08 MED ORDER — AZITHROMYCIN 250 MG PO TABS: ORAL_TABLET | ORAL | 0 refills | Status: AC

## 2021-10-08 NOTE — Patient Instructions (Addendum)
Take Zithromax Z-PAK 2 tabs day 1 followed by 1 tab days 2 through 5.  May take Zofran sparingly if needed for nausea.  Walk some around your home to prevent atelectasis.  Suggest monitoring pulse oximetry with home pulse ox device.  Call if symptoms not improving or worsening.  Stay well-hydrated.

## 2021-10-08 NOTE — Progress Notes (Signed)
   Subjective:    Patient ID: Patricia Ferrell, female    DOB: December 14, 1955, 66 y.o.   MRN: [id number]  HPI 66 year old Female seen today via interactive audio and video telecommunications.  She is agreeable to visit in this format today.  She is identified using 2 identifiers as Patricia Ferrell, a patient in this practice.  She is at her home and I am at my office.  Patient called to say that she had onset last evening of sore throat and headache.  Has felt weak.  Her father was recently diagnosed with COVID-19 and she was around him yesterday.  She took a COVID test today and it was positive.  She has no fever.  She has headache and runny nose.  She has no cough.  She denies shortness of breath.  Has decreased energy.  Does have some chills.  Currently not nauseated.  Patient says this is her second time coming down with COVID-19.  I do not see in epic where she was treated for COVID previously.  Apparently she was at the beach recently and a 47-year-old relative had a febrile illness.  It is not known if the 19-year-old had COVID-19 because he was not tested.  The only COVID-vaccine that we have on file is April 2021.  Patient has been seen here since January 2022.    Cholecystectomy 2016.  Complete hysterectomy around 2009 or 2010.  Left ovary and fallopian tube removed 1994.  Vaginal delivery 1980.  History of migraine headaches.  History of vitamin D deficiency.  History of mixed hyperlipidemia but has not wanted to be on statin medication.  She is intolerant to hydrocodone, oxycodone and codeine.  History of right L5-S1 lumbar laminectomy and microdiscectomy in 2017.  History of right knee lateral meniscectomy, medial collateral ligament reconstruction and chondroplasty of the right knee in April 2021 for lateral meniscus tear and chronic instability of the right knee. She has history of anxiety and depression.  Takes Wellbutrin 300 mg XL daily and has been prescribed Xanax 0.5 mg to take  twice daily if needed in September 2022.     Objective:   Physical Exam  Patient is seen virtually.  She does not sound short of breath.  Is able to give a clear concise history but looks fatigued and slightly pale.  Not heard to be coughing on video interview.  She complains of sore throat, headache malaise and fatigue.  Denies cough.  No shortness of breath.  Has decreased energy and some chills.      Assessment & Plan:   Acute COVID-19 virus infection.  Options for treatment were discussed with her.  She is well within the timeframe for starting Paxlovid but she declines to take it.  We are going to treat her with a Zithromax Z-PAK 2 tabs day 1 followed by 1 tab days 2 through 5 and Zofran 4 mg tablets every 8 hours as needed for nausea.  She is to rest and drink plenty of fluids.  She is to walk some to prevent atelectasis.  Suggest monitoring pulse oximetry but she currently does not have a pulse ox device at her home.  Patient will call if she has further questions or concerns.  She has to quarantine at home for 5 days.

## 2021-10-08 NOTE — Progress Notes (Signed)
   Subjective:    Patient ID: Patricia Ferrell, female    DOB: Feb 21, 1956, 66 y.o.   MRN: [id number]  HPI    Review of Systems     Objective:   Physical Exam        Assessment & Plan:

## 2021-10-18 ENCOUNTER — Ambulatory Visit (HOSPITAL_BASED_OUTPATIENT_CLINIC_OR_DEPARTMENT_OTHER)
Admission: RE | Admit: 2021-10-18 | Discharge: 2021-10-18 | Disposition: A | Discharge status: Home / Self Care | Payer: 59 | Source: Ambulatory Visit | Attending: Internal Medicine | Admitting: Internal Medicine

## 2021-10-18 DIAGNOSIS — E782 Mixed hyperlipidemia: Secondary | ICD-10-CM | POA: Insufficient documentation

## 2021-10-27 ENCOUNTER — Encounter: Payer: Self-pay | Admitting: Internal Medicine

## 2021-12-15 ENCOUNTER — Ambulatory Visit (HOSPITAL_BASED_OUTPATIENT_CLINIC_OR_DEPARTMENT_OTHER): Payer: 59 | Admitting: Orthopaedic Surgery

## 2021-12-15 DIAGNOSIS — G8929 Other chronic pain: Secondary | ICD-10-CM | POA: Diagnosis not present

## 2021-12-15 DIAGNOSIS — M25561 Pain in right knee: Secondary | ICD-10-CM

## 2021-12-15 MED ORDER — LIDOCAINE HCL 1 % IJ SOLN
4.0000 mL | INTRAMUSCULAR | Status: AC | PRN
  Administered 2021-12-15: 4 mL

## 2021-12-15 MED ORDER — TRIAMCINOLONE ACETONIDE 40 MG/ML IJ SUSP
80.0000 mg | INTRAMUSCULAR | Status: AC | PRN
  Administered 2021-12-15: 80 mg via INTRA_ARTICULAR

## 2021-12-15 NOTE — Progress Notes (Signed)
Chief Complaint: Right knee pain     History of Present Illness:   12/15/2021: Here for follow-up of her right knee pain.  She is experiencing pain in the posterior lateral aspect of the knee which remains significantly painful.  She is hoping to be able to walk around in amusement park for an upcoming trip to [place].  She is here today seeking additional injection.  Patricia Ferrell is a 65 y.o. female presents with ongoing right knee pain for quite some time now.  She initially had an injury in April 2021 for which she underwent right knee MCL reconstruction as well as lateral meniscal debridement with Dr. Griffin Basil.  Unfortunately she did have a subsequent injury and the end of 2022 where she tripped over a dog.  Since that time she is having persistent pain about the proximal medial femoral condyle.  She is having some mechanical symptoms as well as popping and clicking.  She did have 2 injections in 2022 with very limited pain relief.  She has started to hike although this has been quite limited as result of her knee pain.  She has been using a hinged brace which helps somewhat particularly with hiking.  She does complain of the knee buckling.  She overall is hoping to remain active and does enjoy hiking.  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    Surgical History:   As detailed above  PMH/PSH/Family History/Social History/Meds/Allergies:    Past Medical History:  Diagnosis Date   Anemia    years ago   Anxiety    Arthritis    Asthma    bronchitis   Back pain    lumbar   Carpal tunnel syndrome    Complication of anesthesia    Depression    Family history of adverse reaction to anesthesia    mother's blood pressure always drops    GERD (gastroesophageal reflux disease)    History of hiatal hernia    Hyperlipidemia    Numbness of right foot    Perimenopausal    PONV (postoperative nausea and vomiting)    Vitamin D deficiency    Past Surgical  History:  Procedure Laterality Date   ABDOMINAL HYSTERECTOMY     DUB   CATARACT EXTRACTION W/PHACO Right 09/08/2021   Procedure: CATARACT EXTRACTION PHACO AND INTRAOCULAR LENS PLACEMENT ([place]) RIGHT;  Surgeon: Leandrew Koyanagi, MD;  Location: [place];  Service: Ophthalmology;  Laterality: Right;  5.05 00:55.6   CATARACT EXTRACTION W/PHACO Left 09/22/2021   Procedure: CATARACT EXTRACTION PHACO AND INTRAOCULAR LENS PLACEMENT ([facility]) LEFT 5.87 00:55.5;  Surgeon: Leandrew Koyanagi, MD;  Location: [place];  Service: Ophthalmology;  Laterality: Left;   CHOLECYSTECTOMY  06/19/2014   CHOLECYSTECTOMY N/A 06/19/2014   Procedure: LAPAROSCOPIC CHOLECYSTECTOMY WITH INTRAOPERATIVE CHOLANGIOGRAM;  Surgeon: Donnie Mesa, MD;  Location: [facility];  Service: General;  Laterality: N/A;   CHONDROPLASTY Right 06/06/2019   Procedure: KNEE ARTHROSCOPY WITH CHONDROPLASTY;  Surgeon: Hiram Gash, MD;  Location: [place];  Service: Orthopedics;  Laterality: Right;   COLONOSCOPY     KNEE ARTHROSCOPY WITH LATERAL MENISECTOMY Right 06/06/2019   Procedure: KNEE ARTHROSCOPY WITH LATERAL MENISECTOMY;  Surgeon: Hiram Gash, MD;  Location: [facility];  Service: Orthopedics;  Laterality: Right;   KNEE ARTHROSCOPY WITH MEDIAL  COLLATERAL LIGAMENT RECONSTRUCTION Right 06/06/2019   Procedure: KNEE ARTHROSCOPY WITH MEDIAL COLLATERAL LIGAMENT RECONSTRUCTION;  Surgeon: Hiram Gash, MD;  Location: [facility];  Service: Orthopedics;  Laterality: Right;   LUMBAR LAMINECTOMY/DECOMPRESSION MICRODISCECTOMY Right 09/16/2015   Procedure: Right - Lumbar five-sacral one  lumbar laminotomy and microdiscectomy;  Surgeon: Jovita Gamma, MD;  Location: [facility];  Service: Neurosurgery;  Laterality: Right;   OOPHORECTOMY  02/21/94   left   Social History   Socioeconomic History   Marital status: Divorced    Spouse name: Not on file   Number of children: 1   Years of  education: 12   Highest education level: Not on file  Occupational History   Occupation: special needs care  Tobacco Use   Smoking status: Former    Types: Cigarettes    Quit date: 02/21/1988    Years since quitting: 33.8   Smokeless tobacco: Never  Vaping Use   Vaping Use: Never used  Substance and Sexual Activity   Alcohol use: Yes    Alcohol/week: 0.0 standard drinks of alcohol    Comment: occasional   Drug use: No   Sexual activity: Never  Other Topics Concern   Not on file  Social History Narrative   One story home   One child   Decaf mostly    Right handed    Family history: History of hypertension osteoporosis heart disease thyroid disorder and lung tumor as well as COPD in mother.  Father with history of hypertension heart disease and arthritis.       Social Determinants of Health   Financial Resource Strain: Not on file  Food Insecurity: Not on file  Transportation Needs: Not on file  Physical Activity: Not on file  Stress: Not on file  Social Connections: Not on file   Family History  Problem Relation Age of Onset   Coronary artery disease Mother    Heart attack Mother    Hypertension Mother    Colon polyps Father    Colon cancer Other    Allergies  Allergen Reactions   Hydrocodone Nausea And Vomiting   Oxycodone     Nausea, vomiting, dizzy   Codeine Nausea And Vomiting   Current Outpatient Medications  Medication Sig Dispense Refill   ALPRAZolam (XANAX) 0.5 MG tablet TAKE 1 TABLET BY MOUTH TWICE DAILY AS NEEDED 60 tablet 2   buPROPion (WELLBUTRIN XL) 300 MG 24 hr tablet TAKE 1 TABLET BY MOUTH DAILY 90 tablet 1   Calcium Carbonate-Vit D-Min (CALCIUM 1200 PO) calcium     COLLAGEN PO Take 1 Scoop by mouth daily after breakfast.     Cyanocobalamin (B-12 PO) B12     diclofenac (VOLTAREN) 75 MG EC tablet Take 1 tablet (75 mg total) by mouth 2 (two) times daily. 30 tablet 4   ELDERBERRY PO Elderberry     famotidine (PEPCID) 20 MG tablet TAKE 1 TABLET  BY MOUTH TWICE DAILY 60 tablet 5   Multiple Vitamin (MULTIVITAMIN ADULT PO) multivitamin     ondansetron (ZOFRAN) 4 MG tablet Take 1 tablet (4 mg total) by mouth every 8 (eight) hours as needed for nausea or vomiting. 20 tablet 0   traMADol (ULTRAM) 50 MG tablet Take 1 tablet (50 mg total) by mouth every 6 (six) hours as needed. 10 tablet 0   No current facility-administered medications for this visit.   No results found.  Review of Systems:   A ROS was performed including pertinent positives and negatives  as documented in the HPI.  Physical Exam :   Constitutional: NAD and appears stated age Neurological: Alert and oriented Psych: Appropriate affect and cooperative There were no vitals taken for this visit.   Comprehensive Musculoskeletal Exam:      Musculoskeletal Exam  Gait Normal  Alignment Normal   Right Left  Inspection Normal Normal  Palpation    Tenderness Lateral joint, medial femoral condyle None  Crepitus None None  Effusion Trace None  Range of Motion    Extension 0 0  Flexion 135 135  Strength    Extension 5/5 5/5  Flexion 5/5 5/5  Ligament Exam     Generalized Laxity No No  Lachman Negative Negative   Pivot Shift Negative Negative  Anterior Drawer Negative Negative  Valgus at 0 Negative Negative  Valgus at 20 Painful about medial femoral condyle without significant laxity Negative  Varus at 0 0 0  Varus at 20   0 0  Posterior Drawer at 90 0 0  Vascular/Lymphatic Exam    Edema None None  Venous Stasis Changes No No  Distal Circulation Normal Normal  Neurologic    Light Touch Sensation Intact Intact  Special Tests: Positive McMurray laterally on the right knee     Imaging:   Xray (4 views right knee including a limb length view): Essentially normal right knee with very mild valgus deformity of a 2 degrees on the right side  MRI (right knee): There is evidence of tearing of the anterior horn of the lateral meniscus with an overall diminutive  appearing meniscus with subsequent extrusion.  There is focal chondral loss particularly involving the tibial plateau on the lateral aspect of the knee.  There is a read tear of her proximal femoral graft of the MCL.  There is an intact portion of the graft posteriorly  I personally reviewed and interpreted the radiographs.   Assessment:   66 y.o. female with a complex right knee.  She does unfortunately have a partial retear of the proximal femoral attachment of her MCL reconstruction which was done 2 years prior.  At today's visit the majority of her pain is involving the lateral joint line.  She did have diagnostic arthroscopy in office with me 1 week prior which revealed grade 2-3 cartilage loss involving the lateral tibial plateau as well as a complete meniscectomy of the lateral meniscus.  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  Unfortunately I do believe that this is progressed significantly enough that I do not believe joint preservation would necessarily give her a durable long-lasting outcome.  To that effect I do believe that if anything were to be performed a more constrained knee arthroplasty would be required.  That being said her arthritis is not significant enough that she would want to pursue this at this time.  At this time she would like to consider and pursue an intra-articular injection of the right knee.  We will plan to proceed with this. Plan :    -Plan for right knee intra-articular ultrasound-guided injection after verbal consent obtained     Procedure Note  Patient: Patricia Ferrell             Date of Birth: [birth year]           MRN: [id number]             Visit Date: 12/15/2021  Procedures: Visit Diagnoses: No diagnosis found.  Large Joint Inj: R knee on 12/15/2021 3:58 PM Indications: pain Details: 22 G 1.5 in needle, ultrasound-guided anterior approach  Arthrogram: No  Medications:  4 mL lidocaine 1 %; 80 mg triamcinolone acetonide 40 MG/ML Outcome: tolerated well, no immediate complications Procedure, treatment alternatives, risks and benefits explained, specific risks discussed. Consent was given by the patient. Immediately prior to procedure a time out was called to verify the correct patient, procedure, equipment, support staff and site/side marked as required. Patient was prepped and draped in the usual sterile fashion.        I personally saw and evaluated the patient, and participated in the management and treatment plan.  Vanetta Mulders, MD Attending Physician, Orthopedic Surgery  This document was dictated using Dragon voice recognition software. A reasonable attempt at proof reading has been made to minimize errors.

## 2021-12-16 DIAGNOSIS — H5711 Ocular pain, right eye: Secondary | ICD-10-CM | POA: Diagnosis not present

## 2021-12-28 DIAGNOSIS — Z961 Presence of intraocular lens: Secondary | ICD-10-CM | POA: Diagnosis not present

## 2021-12-28 DIAGNOSIS — H5203 Hypermetropia, bilateral: Secondary | ICD-10-CM | POA: Diagnosis not present

## 2021-12-28 DIAGNOSIS — H524 Presbyopia: Secondary | ICD-10-CM | POA: Diagnosis not present

## 2021-12-28 DIAGNOSIS — H16223 Keratoconjunctivitis sicca, not specified as Sjogren's, bilateral: Secondary | ICD-10-CM | POA: Diagnosis not present

## 2022-03-14 ENCOUNTER — Other Ambulatory Visit: Payer: Self-pay

## 2022-03-17 ENCOUNTER — Encounter: Payer: Self-pay | Admitting: Internal Medicine

## 2022-04-07 ENCOUNTER — Other Ambulatory Visit: Payer: Self-pay | Admitting: Internal Medicine

## 2022-04-29 ENCOUNTER — Ambulatory Visit (INDEPENDENT_AMBULATORY_CARE_PROVIDER_SITE_OTHER): Payer: 59 | Admitting: Orthopaedic Surgery

## 2022-04-29 DIAGNOSIS — M1711 Unilateral primary osteoarthritis, right knee: Secondary | ICD-10-CM | POA: Diagnosis not present

## 2022-04-29 NOTE — Progress Notes (Signed)
Chief Complaint: Right knee pain     History of Present Illness:   04/29/2022: Here for follow-up of her right knee pain.  At this time she remains symptomatic with pain about the lateral aspect of the knee.  She is having a very difficult time mobilizing and doing basic things like walking.  She has been bracing although this is often giving her prolonged relief.  Patricia Ferrell is a 67 y.o. female presents with ongoing right knee pain for quite some time now.  She initially had an injury in April 2021 for which she underwent right knee MCL reconstruction as well as lateral meniscal debridement with Dr. Griffin Basil.  Unfortunately she did have a subsequent injury and the end of 2022 where she tripped over a dog.  Since that time she is having persistent pain about the proximal medial femoral condyle.  She is having some mechanical symptoms as well as popping and clicking.  She did have 2 injections in 2022 with very limited pain relief.  She has started to hike although this has been quite limited as result of her knee pain.  She has been using a hinged brace which helps somewhat particularly with hiking.  She does complain of the knee buckling.  She overall is hoping to remain active and does enjoy hiking.  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    Surgical History:   As detailed above  PMH/PSH/Family History/Social History/Meds/Allergies:    Past Medical History:  Diagnosis Date   Anemia    years ago   Anxiety    Arthritis    Asthma    bronchitis   Back pain    lumbar   Carpal tunnel syndrome    Complication of anesthesia    Depression    Family history of adverse reaction to anesthesia    mother's blood pressure always drops    GERD (gastroesophageal reflux disease)    History of hiatal hernia    Hyperlipidemia    Numbness of right foot    Perimenopausal    PONV (postoperative nausea and vomiting)    Vitamin D deficiency    Past Surgical  History:  Procedure Laterality Date   ABDOMINAL HYSTERECTOMY     DUB   CATARACT EXTRACTION W/PHACO Right 09/08/2021   Procedure: CATARACT EXTRACTION PHACO AND INTRAOCULAR LENS PLACEMENT ([place]) RIGHT;  Surgeon: Leandrew Koyanagi, MD;  Location: [facility] [facility];  Service: Ophthalmology;  Laterality: Right;  5.05 00:55.6   CATARACT EXTRACTION W/PHACO Left 09/22/2021   Procedure: CATARACT EXTRACTION PHACO AND INTRAOCULAR LENS PLACEMENT ([facility]) LEFT 5.87 00:55.5;  Surgeon: Leandrew Koyanagi, MD;  Location: [facility];  Service: Ophthalmology;  Laterality: Left;   CHOLECYSTECTOMY  06/19/2014   CHOLECYSTECTOMY N/A 06/19/2014   Procedure: LAPAROSCOPIC CHOLECYSTECTOMY WITH INTRAOPERATIVE CHOLANGIOGRAM;  Surgeon: Donnie Mesa, MD;  Location: [facility];  Service: General;  Laterality: N/A;   CHONDROPLASTY Right 06/06/2019   Procedure: KNEE ARTHROSCOPY WITH CHONDROPLASTY;  Surgeon: Hiram Gash, MD;  Location: [facility];  Service: Orthopedics;  Laterality: Right;   COLONOSCOPY     KNEE ARTHROSCOPY WITH LATERAL MENISECTOMY Right 06/06/2019   Procedure: KNEE ARTHROSCOPY WITH LATERAL MENISECTOMY;  Surgeon: Hiram Gash, MD;  Location: [facility];  Service: Orthopedics;  Laterality: Right;   KNEE ARTHROSCOPY WITH MEDIAL  COLLATERAL LIGAMENT RECONSTRUCTION Right 06/06/2019   Procedure: KNEE ARTHROSCOPY WITH MEDIAL COLLATERAL LIGAMENT RECONSTRUCTION;  Surgeon: Hiram Gash, MD;  Location: [facility];  Service: Orthopedics;  Laterality: Right;   LUMBAR LAMINECTOMY/DECOMPRESSION MICRODISCECTOMY Right 09/16/2015   Procedure: Right - Lumbar five-sacral one  lumbar laminotomy and microdiscectomy;  Surgeon: Jovita Gamma, MD;  Location: [facility];  Service: Neurosurgery;  Laterality: Right;   OOPHORECTOMY  02/21/94   left   Social History   Socioeconomic History   Marital status: Divorced    Spouse name: Not on file   Number of children: 1   Years of  education: 12   Highest education level: Not on file  Occupational History   Occupation: special needs care  Tobacco Use   Smoking status: Former    Types: Cigarettes    Quit date: 02/21/1988    Years since quitting: 34.2   Smokeless tobacco: Never  Vaping Use   Vaping Use: Never used  Substance and Sexual Activity   Alcohol use: Yes    Alcohol/week: 0.0 standard drinks of alcohol    Comment: occasional   Drug use: No   Sexual activity: Never  Other Topics Concern   Not on file  Social History Narrative   One story home   One child   Decaf mostly    Right handed    Family history: History of hypertension osteoporosis heart disease thyroid disorder and lung tumor as well as COPD in mother.  Father with history of hypertension heart disease and arthritis.       Social Determinants of Health   Financial Resource Strain: Not on file  Food Insecurity: Not on file  Transportation Needs: Not on file  Physical Activity: Not on file  Stress: Not on file  Social Connections: Not on file   Family History  Problem Relation Age of Onset   Coronary artery disease Mother    Heart attack Mother    Hypertension Mother    Colon polyps Father    Colon cancer Other    Allergies  Allergen Reactions   Hydrocodone Nausea And Vomiting   Oxycodone     Nausea, vomiting, dizzy   Codeine Nausea And Vomiting   Current Outpatient Medications  Medication Sig Dispense Refill   ALPRAZolam (XANAX) 0.5 MG tablet TAKE 1 TABLET BY MOUTH TWICE DAILY AS NEEDED 60 tablet 2   buPROPion (WELLBUTRIN XL) 300 MG 24 hr tablet TAKE 1 TABLET BY MOUTH DAILY 90 tablet 1   Calcium Carbonate-Vit D-Min (CALCIUM 1200 PO) calcium     COLLAGEN PO Take 1 Scoop by mouth daily after breakfast.     Cyanocobalamin (B-12 PO) B12     diclofenac (VOLTAREN) 75 MG EC tablet Take 1 tablet (75 mg total) by mouth 2 (two) times daily. 30 tablet 4   ELDERBERRY PO Elderberry     famotidine (PEPCID) 20 MG tablet TAKE 1 TABLET  BY MOUTH TWICE DAILY 60 tablet 5   Multiple Vitamin (MULTIVITAMIN ADULT PO) multivitamin     ondansetron (ZOFRAN) 4 MG tablet Take 1 tablet (4 mg total) by mouth every 8 (eight) hours as needed for nausea or vomiting. 20 tablet 0   traMADol (ULTRAM) 50 MG tablet Take 1 tablet (50 mg total) by mouth every 6 (six) hours as needed. 10 tablet 0   No current facility-administered medications for this visit.   No results found.  Review of Systems:   A ROS was performed including pertinent positives and negatives  as documented in the HPI.  Physical Exam :   Constitutional: NAD and appears stated age Neurological: Alert and oriented Psych: Appropriate affect and cooperative There were no vitals taken for this visit.   Comprehensive Musculoskeletal Exam:      Musculoskeletal Exam  Gait Normal  Alignment Normal   Right Left  Inspection Normal Normal  Palpation    Tenderness Lateral joint, medial femoral condyle None  Crepitus None None  Effusion Trace None  Range of Motion    Extension 0 0  Flexion 135 135  Strength    Extension 5/5 5/5  Flexion 5/5 5/5  Ligament Exam     Generalized Laxity No No  Lachman Negative Negative   Pivot Shift Negative Negative  Anterior Drawer Negative Negative  Valgus at 0 Negative Negative  Valgus at 20 Painful about medial femoral condyle without significant laxity Negative  Varus at 0 0 0  Varus at 20   0 0  Posterior Drawer at 90 0 0  Vascular/Lymphatic Exam    Edema None None  Venous Stasis Changes No No  Distal Circulation Normal Normal  Neurologic    Light Touch Sensation Intact Intact  Special Tests: Positive McMurray laterally on the right knee     Imaging:   Xray (4 views right knee including a limb length view): Essentially normal right knee with very mild valgus deformity of a 2 degrees on the right side  MRI (right knee): There is evidence of tearing of the anterior horn of the lateral meniscus with an overall diminutive  appearing meniscus with subsequent extrusion.  There is focal chondral loss particularly involving the tibial plateau on the lateral aspect of the knee.  There is a read tear of her proximal femoral graft of the MCL.  There is an intact portion of the graft posteriorly  I personally reviewed and interpreted the radiographs.   Assessment:   67 y.o. female with a complex right knee.  She does unfortunately have a partial retear of the proximal femoral attachment of her MCL reconstruction which was done 2 years prior.  At today's visit the majority of her pain is involving the lateral joint line.  She did have diagnostic arthroscopy in office with me 1 week prior which revealed grade 2-3 cartilage loss involving the lateral tibial plateau as well as a complete meniscectomy of the lateral meniscus.  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  Unfortunately I do believe that this is progressed significantly enough that I do not believe joint preservation would necessarily give her a durable long-lasting outcome.  To that effect I do believe that if anything were to be performed a more constrained knee arthroplasty would be required.  At this time while her arthritis is mild she does remain quite symptomatic and has now failed multiple at least 2 steroid injections into the knee.  I did discuss additional treatment options including PRP although I do not believe this would give her prolonged relief due to more of a chronic MCL insufficiency.  To that effect I do believe that she would ultimately benefit from a constrained total knee arthroplasty.  I will plan to refer her to my partner Dr. Ninfa Linden for discussion of this. Plan :    -Plan referral to Dr. Ninfa Linden for discussion of knee arthroplasty    I personally saw and evaluated the patient, and participated in the management and treatment plan.  Patricia Mulders, MD Attending  Physician, Orthopedic Surgery  This document was dictated using Dragon voice recognition software. A reasonable attempt at proof reading has been made to minimize errors.

## 2022-05-02 ENCOUNTER — Encounter (HOSPITAL_BASED_OUTPATIENT_CLINIC_OR_DEPARTMENT_OTHER): Payer: Self-pay | Admitting: Orthopaedic Surgery

## 2022-05-17 ENCOUNTER — Ambulatory Visit (INDEPENDENT_AMBULATORY_CARE_PROVIDER_SITE_OTHER): Payer: 59 | Admitting: Orthopaedic Surgery

## 2022-05-17 ENCOUNTER — Other Ambulatory Visit (INDEPENDENT_AMBULATORY_CARE_PROVIDER_SITE_OTHER): Payer: 59

## 2022-05-17 DIAGNOSIS — M1711 Unilateral primary osteoarthritis, right knee: Secondary | ICD-10-CM

## 2022-05-17 NOTE — Progress Notes (Signed)
The patient is sent to me by one of her partners due to significant arthritis in her right knee.  She has a complex history as it relates to the right knee with a remote history of a MCL reconstruction.  She then sustained a failure of that repair and does ambulate with a hinged knee brace.  She has days with the knee feels unstable to her and hurts with weightbearing but other days where she is able to walk this off.  She is 67 years old.  A MRI of her knee has shown full-thickness cartilage loss of the lateral compartment of her knee as well as a chronic MCL tear.  At this point she is sent to me to consider constrained total knee arthroplasty.  She is not a diabetic.  She is not obese.  She does not have any significant active medical problems.  She would like to discuss surgery today as well.  She has tried and failed conservative treatment for many years now including physical therapy as well.  She has had injections and again several surgeries on that knee.  Examination of the right knee shows some slight laxity MCL.  There is significant lateral joint line tenderness and patellofemoral pain.  She has good range of motion of the knee.  I did review all of her imaging studies including x-rays today of her knee.  We had a long and thorough discussion about knee replacement surgery.  I did show her knee replacement model and we described what the surgery involves.  She would need a constrained liner given her MCL laxity.  I used a knee replacement model to describe what the surgery involves including the risk and benefits of surgery and what to expect from an intraoperative and postoperative course.  Given the detrimental effect her knee is having on her quality of life, her mobility and her actives daily living she wishes to have the surgery performed and I agree with this well.  We will work on getting this scheduled.  All question concerns were answered and addressed.

## 2022-05-23 ENCOUNTER — Ambulatory Visit: Payer: 59 | Admitting: Orthopaedic Surgery

## 2022-05-30 ENCOUNTER — Encounter (HOSPITAL_BASED_OUTPATIENT_CLINIC_OR_DEPARTMENT_OTHER): Payer: Self-pay | Admitting: Orthopaedic Surgery

## 2022-05-31 ENCOUNTER — Encounter: Payer: Self-pay | Admitting: Internal Medicine

## 2022-05-31 ENCOUNTER — Ambulatory Visit (INDEPENDENT_AMBULATORY_CARE_PROVIDER_SITE_OTHER): Payer: 59 | Admitting: Internal Medicine

## 2022-05-31 VITALS — BP 122/78 | HR 66 | Temp 98.6°F | Ht 65.0 in | Wt 182.0 lb

## 2022-05-31 DIAGNOSIS — W57XXXA Bitten or stung by nonvenomous insect and other nonvenomous arthropods, initial encounter: Secondary | ICD-10-CM | POA: Diagnosis not present

## 2022-05-31 DIAGNOSIS — S20462A Insect bite (nonvenomous) of left back wall of thorax, initial encounter: Secondary | ICD-10-CM

## 2022-05-31 MED ORDER — DOXYCYCLINE HYCLATE 100 MG PO TABS: 100.0000 mg | ORAL_TABLET | Freq: Two times a day (BID) | ORAL | 0 refills | Status: DC

## 2022-05-31 MED ORDER — MUPIROCIN 2 % EX OINT
1.0000 | TOPICAL_OINTMENT | Freq: Two times a day (BID) | CUTANEOUS | 0 refills | Status: DC
Start: 2022-05-31 — End: 2022-07-13

## 2022-05-31 NOTE — Progress Notes (Addendum)
Patient Care Team: Margaree Mackintosh, MD as PCP - General (Internal Medicine) Drema Dallas, DO as Consulting Physician (Neurology)  Visit Date: 05/31/22  Subjective:    Patient ID: Patricia Ferrell , Female   DOB: 11-Oct-1955, 67 y.o.    MRN: [id number]   67 y.o. Female presents today for tick bites that she noticed on 05/24/22.  Noticed some itchy welts. Tetanus vaccine is UTD. No headache, nausea or vomiting. Has PE scheduled soon. She brought in the tick she found on her.  I examined it then discarded it.  Past Medical History:  Diagnosis Date   Anemia    years ago   Anxiety    Arthritis    Asthma    bronchitis   Back pain    lumbar   Carpal tunnel syndrome    Complication of anesthesia    Depression    Family history of adverse reaction to anesthesia    mother's blood pressure always drops    GERD (gastroesophageal reflux disease)    History of hiatal hernia    Hyperlipidemia    Numbness of right foot    Perimenopausal    PONV (postoperative nausea and vomiting)    Vitamin D deficiency      Family History  Problem Relation Age of Onset   Coronary artery disease Mother    Heart attack Mother    Hypertension Mother    Colon polyps Father    Colon cancer Other     Social History   Social History Narrative   One story home   One child   Decaf mostly    Right handed    Family history: History of hypertension osteoporosis heart disease thyroid disorder and lung tumor as well as COPD in mother.  Father with history of hypertension heart disease and arthritis.          Review of Systems  Constitutional:  Negative for fever and malaise/fatigue.  HENT:  Negative for congestion.   Eyes:  Negative for blurred vision.  Respiratory:  Negative for cough and shortness of breath.   Cardiovascular:  Negative for chest pain, palpitations and leg swelling.  Gastrointestinal:  Negative for vomiting.  Musculoskeletal:  Negative for back pain.  Skin:  Negative for  rash.       (+) Three insect bites on back  Neurological:  Negative for loss of consciousness and headaches.        Objective:   Vitals: BP 122/78   Pulse 66   Temp 98.6 F (37 C) (Tympanic)   Ht 5\' 5"  (1.651 m)   Wt 182 lb (82.6 kg)   SpO2 100%   BMI 30.29 kg/m    Physical Exam Vitals and nursing note reviewed.  Constitutional:      General: She is not in acute distress.    Appearance: Normal appearance. She is not toxic-appearing.  HENT:     Head: Normocephalic and atraumatic.  Pulmonary:     Effort: Pulmonary effort is normal.  Skin:    General: Skin is warm and dry.     Comments: Three insect bites on left upper mid-back. The upper bite has an excoriated area 4 mm in diameter. No discharge from bites.   Neurological:     Mental Status: She is alert and oriented to person, place, and time. Mental status is at baseline.  Psychiatric:        Mood and Affect: Mood normal.  Behavior: Behavior normal.        Thought Content: Thought content normal.        Judgment: Judgment normal.       Results:   Studies obtained and personally reviewed by me:   Labs:       Component Value Date/Time   NA 143 03/15/2021 1055   K 4.7 03/19/2021 1129   CL 108 03/15/2021 1055   CO2 31 03/15/2021 1055   GLUCOSE 100 (H) 03/15/2021 1055   BUN 19 03/15/2021 1055   CREATININE 0.95 03/15/2021 1055   CALCIUM 10.1 03/15/2021 1055   PROT 6.5 03/15/2021 1055   ALBUMIN 4.2 10/06/2015 1559   AST 20 03/15/2021 1055   ALT 18 03/15/2021 1055   ALKPHOS 96 10/06/2015 1559   BILITOT 0.4 03/15/2021 1055   GFRNONAA 70 03/05/2020 0951   GFRAA 82 03/05/2020 0951     Lab Results  Component Value Date   WBC 6.9 03/15/2021   HGB 13.4 03/15/2021   HCT 40.9 03/15/2021   MCV 87.2 03/15/2021   PLT 257 03/15/2021    Lab Results  Component Value Date   CHOL 261 (H) 09/13/2021   HDL 74 09/13/2021   LDLCALC 153 (H) 09/13/2021   LDLDIRECT 135.2 04/10/2009   TRIG 200 (H)  09/13/2021   CHOLHDL 3.5 09/13/2021    No results found for: "HGBA1C"   Lab Results  Component Value Date   TSH 1.64 03/15/2021      Assessment & Plan:   Insect(Tick) bites: prescribed doxycycline 100 mg twice daily for 5 days, Bactroban ointment 2% apply twice daily. Contact us if develops fever, chill or headache. Has upcoming CPE soon.    I,Alexander Ruley,acting as a Neurosurgeon for Margaree Mackintosh, MD.,have documented all relevant documentation on the behalf of Margaree Mackintosh, MD,as directed by  Margaree Mackintosh, MD while in the presence of Margaree Mackintosh, MD.   I, Margaree Mackintosh, MD, have reviewed all documentation for this visit. The documentation on 06/18/22 for the exam, diagnosis, procedures, and orders are all accurate and complete.

## 2022-05-31 NOTE — Progress Notes (Signed)
   Subjective:    Patient ID: Patricia Ferrell, female    DOB: August 07, 1955, 67 y.o.   MRN: [id number]  HPI Patient seen today regarding a tick bite. Noted several welts on back and sent message about an appointment request early this am.     Review of Systems no fever, chills or headache. Did not save tick.     Objective:   Physical Exam   VS reviewed   3 areas that are raised with central punctum. Looks like  insect bites. Cannot say for sure if tick bite    Assessment & Plan:   Insect bites. Tdap is up to date having been given in  July 2023.  Could well be tick bites or another insect.  Plan: Patient will take 100 mg twice daily for 5 days.  She will call if the lesions do not resolve without sequela.  May apply Bactroban to bite areas twice daily.  Keep lesions clean and dry.  Do not cover them.

## 2022-05-31 NOTE — Telephone Encounter (Signed)
Neeka also called about this, and I have scheduled her for an appointment for today

## 2022-06-13 ENCOUNTER — Encounter: Payer: 59 | Admitting: Nurse Practitioner

## 2022-06-18 ENCOUNTER — Encounter: Payer: Self-pay | Admitting: Internal Medicine

## 2022-06-18 NOTE — Patient Instructions (Addendum)
Take doxycycline 100 mg twice daily for 5 days.  Keep lesions clean and dry.  Call if not improving within a few days or sooner if worse.  Tetanus immunization is up-to-date.  Apply Bactroban to bite areas twice daily.  Keep lesions clean and dry.  Do not cover them.

## 2022-06-22 ENCOUNTER — Encounter (HOSPITAL_BASED_OUTPATIENT_CLINIC_OR_DEPARTMENT_OTHER): Payer: Self-pay | Admitting: Orthopaedic Surgery

## 2022-06-28 ENCOUNTER — Other Ambulatory Visit: Payer: 59

## 2022-06-28 DIAGNOSIS — Z1329 Encounter for screening for other suspected endocrine disorder: Secondary | ICD-10-CM

## 2022-06-28 DIAGNOSIS — F32A Depression, unspecified: Secondary | ICD-10-CM

## 2022-06-28 DIAGNOSIS — E782 Mixed hyperlipidemia: Secondary | ICD-10-CM

## 2022-06-28 NOTE — Addendum Note (Signed)
Addended by: Margaree Mackintosh on: 06/28/2022 04:19 PM   Modules accepted: Level of Service

## 2022-06-29 LAB — CBC WITH DIFFERENTIAL/PLATELET
Absolute Monocytes: 517 cells/uL (ref 200–950)
Basophils Absolute: 69 cells/uL (ref 0–200)
Basophils Relative: 1.1 %
Eosinophils Absolute: 176 cells/uL (ref 15–500)
Eosinophils Relative: 2.8 %
HCT: 40.1 % (ref 35.0–45.0)
Hemoglobin: 13.3 g/dL (ref 11.7–15.5)
Lymphs Abs: 2413 cells/uL (ref 850–3900)
MCH: 28.4 pg (ref 27.0–33.0)
MCHC: 33.2 g/dL (ref 32.0–36.0)
MCV: 85.7 fL (ref 80.0–100.0)
MPV: 9.9 fL (ref 7.5–12.5)
Monocytes Relative: 8.2 %
Neutro Abs: 3125 cells/uL (ref 1500–7800)
Neutrophils Relative %: 49.6 %
Platelets: 270 10*3/uL (ref 140–400)
RBC: 4.68 10*6/uL (ref 3.80–5.10)
RDW: 13.1 % (ref 11.0–15.0)
Total Lymphocyte: 38.3 %
WBC: 6.3 10*3/uL (ref 3.8–10.8)

## 2022-06-29 LAB — COMPLETE METABOLIC PANEL WITH GFR
AG Ratio: 1.9 (calc) (ref 1.0–2.5)
ALT: 16 U/L (ref 6–29)
AST: 15 U/L (ref 10–35)
Albumin: 4.5 g/dL (ref 3.6–5.1)
Alkaline phosphatase (APISO): 85 U/L (ref 37–153)
BUN: 21 mg/dL (ref 7–25)
CO2: 26 mmol/L (ref 20–32)
Calcium: 10 mg/dL (ref 8.6–10.4)
Chloride: 106 mmol/L (ref 98–110)
Creat: 1.05 mg/dL (ref 0.50–1.05)
Globulin: 2.4 g/dL (calc) (ref 1.9–3.7)
Glucose, Bld: 101 mg/dL — ABNORMAL HIGH (ref 65–99)
Potassium: 4.6 mmol/L (ref 3.5–5.3)
Sodium: 143 mmol/L (ref 135–146)
Total Bilirubin: 0.5 mg/dL (ref 0.2–1.2)
Total Protein: 6.9 g/dL (ref 6.1–8.1)
eGFR: 59 mL/min/{1.73_m2} — ABNORMAL LOW (ref >=60)

## 2022-06-29 LAB — LIPID PANEL
Cholesterol: 258 mg/dL — ABNORMAL HIGH (ref <200)
HDL: 65 mg/dL (ref >=50)
LDL Cholesterol (Calc): 166 mg/dL (calc) — ABNORMAL HIGH
Non-HDL Cholesterol (Calc): 193 mg/dL (calc) — ABNORMAL HIGH (ref <130)
Total CHOL/HDL Ratio: 4 (calc) (ref <5.0)
Triglycerides: 131 mg/dL (ref <150)

## 2022-06-29 LAB — TSH: TSH: 1.93 mIU/L (ref 0.40–4.50)

## 2022-07-01 ENCOUNTER — Encounter (HOSPITAL_BASED_OUTPATIENT_CLINIC_OR_DEPARTMENT_OTHER): Payer: Self-pay | Admitting: Orthopaedic Surgery

## 2022-07-01 ENCOUNTER — Ambulatory Visit (INDEPENDENT_AMBULATORY_CARE_PROVIDER_SITE_OTHER): Payer: 59 | Admitting: Orthopaedic Surgery

## 2022-07-01 DIAGNOSIS — M1711 Unilateral primary osteoarthritis, right knee: Secondary | ICD-10-CM

## 2022-07-01 MED ORDER — LIDOCAINE HCL 1 % IJ SOLN
4.0000 mL | INTRAMUSCULAR | Status: AC | PRN
  Administered 2022-07-01: 4 mL

## 2022-07-01 MED ORDER — TRIAMCINOLONE ACETONIDE 40 MG/ML IJ SUSP
80.0000 mg | INTRAMUSCULAR | Status: AC | PRN
  Administered 2022-07-01: 80 mg via INTRA_ARTICULAR

## 2022-07-01 NOTE — Progress Notes (Signed)
Chief Complaint: Right knee pain     History of Present Illness:   07/01/2022: Here today for right knee injection.  She is scheduled with a knee arthroplasty with Dr. Magnus Ivan at the end of the summer.  She is hoping to get a knee injection in order to get some relief prior to an upcoming cruise.  Patricia Ferrell is a 67 y.o. female presents with ongoing right knee pain for quite some time now.  She initially had an injury in April 2021 for which she underwent right knee MCL reconstruction as well as lateral meniscal debridement with Dr. Everardo Pacific.  Unfortunately she did have a subsequent injury and the end of 2022 where she tripped over a dog.  Since that time she is having persistent pain about the proximal medial femoral condyle.  She is having some mechanical symptoms as well as popping and clicking.  She did have 2 injections in 2022 with very limited pain relief.  She has started to hike although this has been quite limited as result of her knee pain.  She has been using a hinged brace which helps somewhat particularly with hiking.  She does complain of the knee buckling.  She overall is hoping to remain active and does enjoy hiking.  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    Surgical History:   As detailed above  PMH/PSH/Family History/Social History/Meds/Allergies:    Past Medical History:  Diagnosis Date  . Anemia    years ago  . Anxiety   . Arthritis   . Asthma    bronchitis  . Back pain    lumbar  . Carpal tunnel syndrome   . Complication of anesthesia   . Depression   . Family history of adverse reaction to anesthesia    mother's blood pressure always drops   . GERD (gastroesophageal reflux disease)   . History of hiatal hernia   . Hyperlipidemia   . Numbness of right foot   . Perimenopausal   . PONV (postoperative nausea and vomiting)   . Vitamin D deficiency    Past Surgical History:  Procedure Laterality Date  . ABDOMINAL  HYSTERECTOMY     DUB  . CATARACT EXTRACTION W/PHACO Right 09/08/2021   Procedure: CATARACT EXTRACTION PHACO AND INTRAOCULAR LENS PLACEMENT (IOC) RIGHT;  Surgeon: Lockie Mola, MD;  Location: [facility];  Service: Ophthalmology;  Laterality: Right;  5.05 00:55.6  . CATARACT EXTRACTION W/PHACO Left 09/22/2021   Procedure: CATARACT EXTRACTION PHACO AND INTRAOCULAR LENS PLACEMENT (IOC) LEFT 5.87 00:55.5;  Surgeon: Lockie Mola, MD;  Location: [facility];  Service: Ophthalmology;  Laterality: Left;  . CHOLECYSTECTOMY  06/19/2014  . CHOLECYSTECTOMY N/A 06/19/2014   Procedure: LAPAROSCOPIC CHOLECYSTECTOMY WITH INTRAOPERATIVE CHOLANGIOGRAM;  Surgeon: Manus Rudd, MD;  Location: [facility];  Service: General;  Laterality: N/A;  . CHONDROPLASTY Right 06/06/2019   Procedure: KNEE ARTHROSCOPY WITH CHONDROPLASTY;  Surgeon: Bjorn Pippin, MD;  Location: [facility];  Service: Orthopedics;  Laterality: Right;  . COLONOSCOPY    . KNEE ARTHROSCOPY WITH LATERAL MENISECTOMY Right 06/06/2019   Procedure: KNEE ARTHROSCOPY WITH LATERAL MENISECTOMY;  Surgeon: Bjorn Pippin, MD;  Location: [facility];  Service: Orthopedics;  Laterality: Right;  . KNEE ARTHROSCOPY WITH MEDIAL COLLATERAL LIGAMENT RECONSTRUCTION Right 06/06/2019   Procedure: KNEE  ARTHROSCOPY WITH MEDIAL COLLATERAL LIGAMENT RECONSTRUCTION;  Surgeon: Bjorn Pippin, MD;  Location: [facility];  Service: Orthopedics;  Laterality: Right;  . LUMBAR LAMINECTOMY/DECOMPRESSION MICRODISCECTOMY Right 09/16/2015   Procedure: Right - Lumbar five-sacral one  lumbar laminotomy and microdiscectomy;  Surgeon: Shirlean Kelly, MD;  Location: [facility];  Service: Neurosurgery;  Laterality: Right;  . OOPHORECTOMY  02/21/94   left   Social History   Socioeconomic History  . Marital status: Divorced    Spouse name: Not on file  . Number of children: 1  . Years of education: 30  . Highest education level:  Not on file  Occupational History  . Occupation: special needs care  Tobacco Use  . Smoking status: Former    Types: Cigarettes    Quit date: 02/21/1988    Years since quitting: 34.3  . Smokeless tobacco: Never  Vaping Use  . Vaping Use: Never used  Substance and Sexual Activity  . Alcohol use: Yes    Alcohol/week: 0.0 standard drinks of alcohol    Comment: occasional  . Drug use: No  . Sexual activity: Never  Other Topics Concern  . Not on file  Social History Narrative   One story home   One child   Decaf mostly    Right handed    Family history: History of hypertension osteoporosis heart disease thyroid disorder and lung tumor as well as COPD in mother.  Father with history of hypertension heart disease and arthritis.       Social Determinants of Health   Financial Resource Strain: Not on file  Food Insecurity: Not on file  Transportation Needs: Not on file  Physical Activity: Not on file  Stress: Not on file  Social Connections: Not on file   Family History  Problem Relation Age of Onset  . Coronary artery disease Mother   . Heart attack Mother   . Hypertension Mother   . Colon polyps Father   . Colon cancer Other    Allergies  Allergen Reactions  . Hydrocodone Nausea And Vomiting  . Oxycodone     Nausea, vomiting, dizzy  . Codeine Nausea And Vomiting   Current Outpatient Medications  Medication Sig Dispense Refill  . ALPRAZolam (XANAX) 0.5 MG tablet TAKE 1 TABLET BY MOUTH TWICE DAILY AS NEEDED 60 tablet 2  . buPROPion (WELLBUTRIN XL) 300 MG 24 hr tablet TAKE 1 TABLET BY MOUTH DAILY 90 tablet 1  . Calcium Carbonate-Vit D-Min (CALCIUM 1200 PO) calcium    . COLLAGEN PO Take 1 Scoop by mouth daily after breakfast.    . Cyanocobalamin (B-12 PO) B12    . diclofenac (VOLTAREN) 75 MG EC tablet Take 1 tablet (75 mg total) by mouth 2 (two) times daily. 30 tablet 4  . famotidine (PEPCID) 20 MG tablet TAKE 1 TABLET BY MOUTH TWICE DAILY 60 tablet 5  . Multiple  Vitamin (MULTIVITAMIN ADULT PO) multivitamin    . mupirocin ointment (BACTROBAN) 2 % Apply 1 Application topically 2 (two) times daily. 22 g 0  . ondansetron (ZOFRAN) 4 MG tablet Take 1 tablet (4 mg total) by mouth every 8 (eight) hours as needed for nausea or vomiting. 20 tablet 0  . traMADol (ULTRAM) 50 MG tablet Take 1 tablet (50 mg total) by mouth every 6 (six) hours as needed. 10 tablet 0   No current facility-administered medications for this visit.   No results found.  Review of Systems:   A ROS was performed including pertinent positives  and negatives as documented in the HPI.  Physical Exam :   Constitutional: NAD and appears stated age Neurological: Alert and oriented Psych: Appropriate affect and cooperative There were no vitals taken for this visit.   Comprehensive Musculoskeletal Exam:      Musculoskeletal Exam  Gait Normal  Alignment Normal   Right Left  Inspection Normal Normal  Palpation    Tenderness Lateral joint, medial femoral condyle None  Crepitus None None  Effusion Trace None  Range of Motion    Extension 0 0  Flexion 135 135  Strength    Extension 5/5 5/5  Flexion 5/5 5/5  Ligament Exam     Generalized Laxity No No  Lachman Negative Negative   Pivot Shift Negative Negative  Anterior Drawer Negative Negative  Valgus at 0 Negative Negative  Valgus at 20 Painful about medial femoral condyle without significant laxity Negative  Varus at 0 0 0  Varus at 20   0 0  Posterior Drawer at 90 0 0  Vascular/Lymphatic Exam    Edema None None  Venous Stasis Changes No No  Distal Circulation Normal Normal  Neurologic    Light Touch Sensation Intact Intact  Special Tests: Positive McMurray laterally on the right knee     Imaging:   Xray (4 views right knee including a limb length view): Essentially normal right knee with very mild valgus deformity of a 2 degrees on the right side  MRI (right knee): There is evidence of tearing of the anterior horn  of the lateral meniscus with an overall diminutive appearing meniscus with subsequent extrusion.  There is focal chondral loss particularly involving the tibial plateau on the lateral aspect of the knee.  There is a read tear of her proximal femoral graft of the MCL.  There is an intact portion of the graft posteriorly  I personally reviewed and interpreted the radiographs.   Assessment:   67 y.o. female with a complex right knee.  She does unfortunately have a partial retear of the proximal femoral attachment of her MCL reconstruction which was done 2 years prior.  At today's visit the majority of her pain is involving the lateral joint line.  She did have diagnostic arthroscopy in office with me 1 week prior which revealed grade 2-3 cartilage loss involving the lateral tibial plateau as well as a complete meniscectomy of the lateral meniscus.  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  Unfortunately I do believe that this is progressed significantly enough that I do not believe joint preservation would necessarily give her a durable long-lasting outcome.  To that effect I do believe that if anything were to be performed a more constrained knee arthroplasty would be required.  At this time she does have a knee arthroplasty scheduled with Dr. Magnus Ivan.  I will plan to perform a right knee injection as requested Plan :    -Right knee ultrasound-guided injection informed verbal consent obtained    Procedure Note  Patient: Patricia Ferrell             Date of Birth: 1955-08-09           MRN: [id number]             Visit Date: 07/01/2022  Procedures: Visit Diagnoses: No diagnosis found.  Large Joint Inj: R knee on 07/01/2022 2:57 PM Indications: pain Details: 22 G 1.5 in needle, ultrasound-guided anterior approach  Arthrogram: No  Medications: 4 mL lidocaine 1 %; 80 mg triamcinolone acetonide 40 MG/ML Outcome: tolerated  well, no immediate complications Procedure, treatment alternatives, risks and benefits explained, specific risks discussed. Consent was given by the patient. Immediately prior to procedure a time out was called to verify the correct patient, procedure, equipment, support staff and site/side marked as required. Patient was prepped and draped in the usual sterile fashion.         I personally saw and evaluated the patient, and participated in the management and treatment plan.  Huel Cote, MD Attending Physician, Orthopedic Surgery  This document was dictated using Dragon voice recognition software. A reasonable attempt at proof reading has been made to minimize errors.

## 2022-07-04 NOTE — Progress Notes (Signed)
Patient Care Team: Margaree Mackintosh, MD as PCP - General (Internal Medicine) Drema Dallas, DO as Consulting Physician (Neurology)  Visit Date: 07/11/22  Subjective:    Patient ID: Patricia Ferrell , Female   DOB: 07-29-1955, 67 y.o.    MRN: [id number]   67 y.o. Female presents today for annual comprehensive physical exam.  Reports excessive daytime sleepiness. Had an incident recently where she became very sleepy while driving with her father to the beach and fell asleep briefly a couple of times. Denies taking alprazolam prior to driving in this instance.Had not taken Alprazolam when this happened.  Reports having abdominal bloating recently, obstipation.  History of anemia in remote past.  History of anxiety and depression treated with alprazolam 0.5 mg twice daily as needed, bupropion 300 mg daily.  History of lumbar back pain treated with diclofenac 75 mg twice daily, tramadol 50 mg every 6 hours as needed. History of osteoarthritis right knee. Had lumbar surgery July 2017. Received injection from Dr. Steward Drone 07/01/22. Scheduled for right knee arthroplasty with Dr. Magnus Ivan at the end of the Summer. Remote history of right MCL reconstruction.  Has chronic L5-S1 radiculopathy of the right lower extremity and right leg numbness.  Has seen Dr. Everlena Cooper, Neurologist and had nerve conduction studies in June 2023.  History of right facial numbness in 2020 and also saw Dr. Everlena Cooper.  This resolved.    History of GERD treated with famotidine 20 mg twice daily.  History of hyperlipidemia. Does not wish to be on statin medication. CHOL elevated at 258, LDL at 166.  Dr. Tawanna Cooler was her former primary care physician who is now retired.  He removed irritated keratoses x2 left anterior chest wall in March 2019.   Care everywhere records indicates she had squamous metaplasia of the cervix and had hysterectomy and left oophorectomy in 2011.  Patient says she had right oophorectomy and right Fallopian  tube removed at age 21.  History of back surgery 2017 by Dr. Lonni Fix and cholecystectomy in 2016.  Intolerant of codeine.  GYN records indicate she has history of condyloma and osteopenia.   Had childbirth (vaginal delivery) 1980.  TSH at 1.93. Glucose slightly elevated at 101. GFR low at 59. Electrolytes normal. Liver function normal. Blood proteins normal. CBC normal.  Apparently saw Dr. Mitchel Honour in December 2022 for GYN exam. Cervix was noted to be absent and Pap was taken of vaginal cuff.   Mammogram last completed 02/18/21. No mammographic evidence of malignancy. Recommended repeat in 2023.  Colonoscopy last completed 12/11/17. Entire examined colon was normal. Repeat recommended in 2026.  Bone density study in 2019 had a T score of -1.2 in the right femoral neck and -1.3 in the left femoral neck. HP spine score was -1.0 and she was diagnosed with osteopenia. Taking calcium, Vitamin D supplement.  Past Medical History:  Diagnosis Date   Anemia    years ago   Anxiety    Arthritis    Asthma    bronchitis   Back pain    lumbar   Carpal tunnel syndrome    Complication of anesthesia    Depression    Family history of adverse reaction to anesthesia    mother's blood pressure always drops    GERD (gastroesophageal reflux disease)    History of hiatal hernia    Hyperlipidemia    Numbness of right foot    Perimenopausal    PONV (postoperative nausea and vomiting)    Vitamin  D deficiency      Family History  Problem Relation Age of Onset   Coronary artery disease Mother    Heart attack Mother    Hypertension Mother    Colon polyps Father    Colon cancer Other     Social History   Social History Narrative   One story home   One child   Decaf mostly    Right handed    Family history: History of hypertension osteoporosis heart disease thyroid disorder and lung tumor as well as COPD in mother.  Father with history of hypertension heart disease and arthritis.           Review of Systems  Constitutional:  Negative for chills, fever, malaise/fatigue and weight loss.  HENT:  Negative for hearing loss, sinus pain and sore throat.   Respiratory:  Negative for cough, hemoptysis and shortness of breath.   Cardiovascular:  Negative for chest pain, palpitations, leg swelling and PND.  Gastrointestinal:  Positive for abdominal pain (Bloating). Negative for constipation, diarrhea, heartburn, nausea and vomiting.       (+) Obstipation  Genitourinary:  Negative for dysuria, frequency and urgency.  Musculoskeletal:  Negative for back pain, myalgias and neck pain.  Skin:  Negative for itching and rash.  Neurological:  Negative for dizziness, tingling, seizures and headaches.       (+) Excessive daytime sleepiness  Endo/Heme/Allergies:  Negative for polydipsia.  Psychiatric/Behavioral:  Negative for depression. The patient is not nervous/anxious.         Objective:   Vitals: BP 108/72   Pulse 69   Temp 98.1 F (36.7 C) (Tympanic)   Ht 5\' 5"  (1.651 m)   Wt 178 lb 12.8 oz (81.1 kg)   SpO2 98%   BMI 29.75 kg/m    Physical Exam Vitals and nursing note reviewed.  Constitutional:      General: She is not in acute distress.    Appearance: Normal appearance. She is not ill-appearing or toxic-appearing.  HENT:     Head: Normocephalic and atraumatic.     Right Ear: Hearing, tympanic membrane, ear canal and external ear normal.     Left Ear: Hearing, tympanic membrane, ear canal and external ear normal.     Mouth/Throat:     Pharynx: Oropharynx is clear.  Eyes:     Extraocular Movements: Extraocular movements intact.     Pupils: Pupils are equal, round, and reactive to light.  Neck:     Thyroid: No thyroid mass, thyromegaly or thyroid tenderness.     Vascular: No carotid bruit.  Cardiovascular:     Rate and Rhythm: Normal rate and regular rhythm. No extrasystoles are present.    Pulses:          Dorsalis pedis pulses are 1+ on the right side and  1+ on the left side.     Heart sounds: Normal heart sounds. No murmur heard.    No friction rub. No gallop.  Pulmonary:     Effort: Pulmonary effort is normal.     Breath sounds: Normal breath sounds. No decreased breath sounds, wheezing, rhonchi or rales.  Chest:     Chest wall: No mass.  Abdominal:     Palpations: Abdomen is soft. There is no hepatomegaly, splenomegaly or mass.     Tenderness: There is no abdominal tenderness.     Hernia: No hernia is present.  Musculoskeletal:     Cervical back: Normal range of motion.     Right lower  leg: 1+ Edema present.     Left lower leg: 1+ Edema present.  Lymphadenopathy:     Cervical: No cervical adenopathy.     Upper Body:     Right upper body: No supraclavicular adenopathy.     Left upper body: No supraclavicular adenopathy.  Skin:    General: Skin is warm and dry.  Neurological:     General: No focal deficit present.     Mental Status: She is alert and oriented to person, place, and time. Mental status is at baseline.     Sensory: Sensation is intact.     Motor: Motor function is intact. No weakness.     Deep Tendon Reflexes: Reflexes are normal and symmetric.  Psychiatric:        Attention and Perception: Attention normal.        Mood and Affect: Mood normal.        Speech: Speech normal.        Behavior: Behavior normal.        Thought Content: Thought content normal.        Cognition and Memory: Cognition normal.        Judgment: Judgment normal.       Results:   Studies obtained and personally reviewed by me:  Mammogram last completed 02/18/21. No mammographic evidence of malignancy. Recommended repeat in 2023.  Colonoscopy last completed 12/11/17. Entire examined colon was normal. Repeat recommended in 2026.  Bone density study in 2019 had a T score of -1.2 in the right femoral neck and -1.3 in the left femoral neck. HP spine score was -1.0 and she was diagnosed with osteopenia.   Labs:       Component Value  Date/Time   NA 143 06/28/2022 0955   K 4.6 06/28/2022 0955   CL 106 06/28/2022 0955   CO2 26 06/28/2022 0955   GLUCOSE 101 (H) 06/28/2022 0955   BUN 21 06/28/2022 0955   CREATININE 1.05 06/28/2022 0955   CALCIUM 10.0 06/28/2022 0955   PROT 6.9 06/28/2022 0955   ALBUMIN 4.2 10/06/2015 1559   AST 15 06/28/2022 0955   ALT 16 06/28/2022 0955   ALKPHOS 96 10/06/2015 1559   BILITOT 0.5 06/28/2022 0955   GFRNONAA 70 03/05/2020 0951   GFRAA 82 03/05/2020 0951     Lab Results  Component Value Date   WBC 6.3 06/28/2022   HGB 13.3 06/28/2022   HCT 40.1 06/28/2022   MCV 85.7 06/28/2022   PLT 270 06/28/2022    Lab Results  Component Value Date   CHOL 258 (H) 06/28/2022   HDL 65 06/28/2022   LDLCALC 166 (H) 06/28/2022   LDLDIRECT 135.2 04/10/2009   TRIG 131 06/28/2022   CHOLHDL 4.0 06/28/2022    No results found for: "HGBA1C"   Lab Results  Component Value Date   TSH 1.93 06/28/2022      Assessment & Plan:   Excessive daytime sleepiness: requested sleep study and Pulmonary consult. Ordered home sleep test, CXR.   Abdominal bloating, obstipation: ordered KUB. Advised to try fiber supplement or Miralax daily. Colonoscopy due 2026. May need to see GI if symptoms persist. Normal colonoscopy 2019  Anxiety and depression: stable with alprazolam 0.5 mg twice daily as needed, bupropion 300 mg daily.  Lumbar back pain, right knee osteoarthritis: treated with diclofenac 75 mg twice daily, tramadol 50 mg every 6 hours as needed. Received injection from Dr. Steward Drone 07/01/22. Scheduled for right knee arthroplasty with Dr. Magnus Ivan at the end of  the summer.   GERD: treated with famotidine 20 mg twice daily.  Hyperlipidemia: does not wish to be on statin medication. CHOL elevated at 258, LDL at 166.  Mammogram: last completed 02/18/21. No mammographic evidence of malignancy. Recommended repeat in 2023.  Colonoscopy: last completed 12/11/17. Entire examined colon was normal. Repeat  recommended in 2026.  Bone density: study in 2019 had a T score of -1.2 in the right femoral neck and -1.3 in the left femoral neck. HP spine score was -1.0 and she was diagnosed with osteopenia. Taking calcium, Vitamin D supplement.  Vaccine counseling: UTD on shingles, flu, tetanus, Covid-19 vaccines.   Return in 1 year for health maintenance exam or as needed.    I,Alexander Ruley,acting as a Neurosurgeon for Margaree Mackintosh, MD.,have documented all relevant documentation on the behalf of Margaree Mackintosh, MD,as directed by  Margaree Mackintosh, MD while in the presence of Margaree Mackintosh, MD.   I, Margaree Mackintosh, MD, have reviewed all documentation for this visit. The documentation on 07/11/22 for the exam, diagnosis, procedures, and orders are all accurate and complete.

## 2022-07-07 ENCOUNTER — Other Ambulatory Visit: Payer: Self-pay

## 2022-07-11 ENCOUNTER — Ambulatory Visit
Admission: RE | Admit: 2022-07-11 | Discharge: 2022-07-11 | Disposition: A | Discharge status: Home / Self Care | Payer: 59 | Source: Ambulatory Visit | Attending: Internal Medicine | Admitting: Internal Medicine

## 2022-07-11 ENCOUNTER — Encounter: Payer: Self-pay | Admitting: Internal Medicine

## 2022-07-11 ENCOUNTER — Ambulatory Visit (INDEPENDENT_AMBULATORY_CARE_PROVIDER_SITE_OTHER): Payer: 59 | Admitting: Internal Medicine

## 2022-07-11 VITALS — BP 108/72 | HR 69 | Temp 98.1°F | Ht 65.0 in | Wt 178.8 lb

## 2022-07-11 DIAGNOSIS — R140 Abdominal distension (gaseous): Secondary | ICD-10-CM

## 2022-07-11 DIAGNOSIS — F32A Depression, unspecified: Secondary | ICD-10-CM

## 2022-07-11 DIAGNOSIS — K219 Gastro-esophageal reflux disease without esophagitis: Secondary | ICD-10-CM

## 2022-07-11 DIAGNOSIS — R400 Somnolence: Secondary | ICD-10-CM

## 2022-07-11 DIAGNOSIS — F419 Anxiety disorder, unspecified: Secondary | ICD-10-CM

## 2022-07-11 DIAGNOSIS — E782 Mixed hyperlipidemia: Secondary | ICD-10-CM

## 2022-07-11 DIAGNOSIS — Z0000 Encounter for general adult medical examination without abnormal findings: Secondary | ICD-10-CM

## 2022-07-11 DIAGNOSIS — R062 Wheezing: Secondary | ICD-10-CM | POA: Diagnosis not present

## 2022-07-11 LAB — POCT URINALYSIS DIPSTICK
Bilirubin, UA: NEGATIVE
Glucose, UA: NEGATIVE
Ketones, UA: NEGATIVE
Leukocytes, UA: NEGATIVE
Nitrite, UA: NEGATIVE
Protein, UA: NEGATIVE
Spec Grav, UA: 1.015 (ref 1.010–1.025)
Urobilinogen, UA: 0.2 E.U./dL
pH, UA: 5 (ref 5.0–8.0)

## 2022-07-11 MED ORDER — FAMOTIDINE 20 MG PO TABS: 20.0000 mg | ORAL_TABLET | Freq: Two times a day (BID) | ORAL | 3 refills | Status: AC

## 2022-07-11 MED ORDER — ALBUTEROL SULFATE HFA 108 (90 BASE) MCG/ACT IN AERS: 2.0000 | INHALATION_SPRAY | Freq: Four times a day (QID) | RESPIRATORY_TRACT | 0 refills | Status: AC | PRN

## 2022-07-11 NOTE — Patient Instructions (Addendum)
Pulmonary consult for excessive daytime sleepiness. Denies taking sedating meds before trip recently where she found herself trying to fall asleep while driving. Have asked for sleep study and consult. Ordered home sleep test in addition to Pulmonary consult. Ordered CXR. Ordered KUB for abdominal bloating. Try Fiber supplement or Miralax daily for bloating. Colonoscopy not due until 2026. Pepcid refilled Vaccines discussed. RTC in one year or as needed.

## 2022-07-13 ENCOUNTER — Ambulatory Visit: Payer: 59

## 2022-07-13 ENCOUNTER — Ambulatory Visit: Payer: 59 | Admitting: Nurse Practitioner

## 2022-07-13 ENCOUNTER — Other Ambulatory Visit: Payer: Self-pay | Admitting: Internal Medicine

## 2022-07-13 ENCOUNTER — Encounter: Payer: Self-pay | Admitting: Nurse Practitioner

## 2022-07-13 VITALS — BP 114/76 | Ht 64.75 in | Wt 178.0 lb

## 2022-07-13 DIAGNOSIS — M85851 Other specified disorders of bone density and structure, right thigh: Secondary | ICD-10-CM | POA: Diagnosis not present

## 2022-07-13 DIAGNOSIS — Z780 Asymptomatic menopausal state: Secondary | ICD-10-CM | POA: Diagnosis not present

## 2022-07-13 DIAGNOSIS — M85852 Other specified disorders of bone density and structure, left thigh: Secondary | ICD-10-CM | POA: Diagnosis not present

## 2022-07-13 DIAGNOSIS — Z01419 Encounter for gynecological examination (general) (routine) without abnormal findings: Secondary | ICD-10-CM

## 2022-07-13 DIAGNOSIS — Z1231 Encounter for screening mammogram for malignant neoplasm of breast: Secondary | ICD-10-CM

## 2022-07-13 NOTE — Progress Notes (Signed)
   Patricia Ferrell 22-Sep-1955 [id number]   History:  67 y.o. G2P1 presents as new patient to establish care. No GYN complaints. Postmenopausal - no HRT. S/P 2009 TAH LSO for DUB. Normal pap history.   Gynecologic History No LMP recorded. Patient has had a hysterectomy.   Contraception/Family planning: status post hysterectomy Sexually active: No  Health Maintenance Last Pap: 2009. Results were: Normal  Last mammogram: 02/18/2021. Results were: Normal Last colonoscopy: 12/11/2017. 7-year recall Last Dexa: 12/05/2017. Results were: T-score -1.3  Past medical history, past surgical history, family history and social history were all reviewed and documented in the EPIC chart. Divorced. Caregiver for adult with disabilities.   ROS:  A ROS was performed and pertinent positives and negatives are included.  Exam:  Vitals:   07/13/22 1139  BP: 114/76  Weight: 178 lb (80.7 kg)  Height: 5' 4.75" (1.645 m)   Body mass index is 29.85 kg/m.  General appearance:  Normal Thyroid:  Symmetrical, normal in size, without palpable masses or nodularity. Respiratory  Auscultation:  Clear without wheezing or rhonchi Cardiovascular  Auscultation:  Regular rate, without rubs, murmurs or gallops  Edema/varicosities:  Not grossly evident Abdominal  Soft,nontender, without masses, guarding or rebound.  Liver/spleen:  No organomegaly noted  Hernia:  None appreciated  Skin  Inspection:  Grossly normal Breasts: Examined lying and sitting.   Right: Without masses, retractions, nipple discharge or axillary adenopathy.   Left: Without masses, retractions, nipple discharge or axillary adenopathy. Genitourinary   Inguinal/mons:  Normal without inguinal adenopathy  External genitalia:  Normal appearing vulva with no masses, tenderness, or lesions  BUS/Urethra/Skene's glands:  Normal  Vagina:  Normal appearing with normal color and discharge, no lesions  Cervix:  and uterus  absent  Adnexa/parametria:     Rt: Normal in size, without masses or tenderness.   Lt: Normal in size, without masses or tenderness.  Anus and perineum: Normal  Digital rectal exam: Deferred  Patient informed chaperone available to be present for breast and pelvic exam. Patient has requested no chaperone to be present. Patient has been advised what will be completed during breast and pelvic exam.   Assessment/Plan:  68 y.o. G2P1 to establish care.   Well female exam with routine gynecological exam - Education provided on SBEs, importance of preventative screenings, current guidelines, high calcium diet, regular exercise, and multivitamin daily.  Labs with PCP.   Postmenopausal - No HRT. S/P 2009 TAH for DUB. 1996 LSO for benign tumor per patient.   Osteopenia of necks of both femurs - Plan: DG Bone Density. Mild osteopenia in bilateral hips. Recommend Vit D + Calcium and regular exercise. Will schedule DXA.   Screening for cervical cancer - Normal Pap history.  No longer screening per guidelines.   Screening for breast cancer - Normal mammogram history. Overdue and encouraged to schedule. Information provided on local imaging center. Normal breast exam today.  Screening for colon cancer - 2019 colonoscopy. Will repeat at 7-year interval per GI's recommendation.   Return in 1 year for annual.     Olivia Mackie DNP, 12:12 PM 07/13/2022

## 2022-07-19 ENCOUNTER — Telehealth: Payer: Self-pay | Admitting: Internal Medicine

## 2022-07-19 NOTE — Telephone Encounter (Signed)
-----   Message from Eulis Foster, FNP sent at 07/15/2022 11:43 AM EDT ----- CXR is normal

## 2022-07-19 NOTE — Telephone Encounter (Signed)
Called and spoke to patient about her CXR results and she also ask about abdomen results so I read the results of them and Dr Iona Hansen advise.

## 2022-07-28 ENCOUNTER — Ambulatory Visit (INDEPENDENT_AMBULATORY_CARE_PROVIDER_SITE_OTHER): Payer: 59

## 2022-07-28 DIAGNOSIS — Z1231 Encounter for screening mammogram for malignant neoplasm of breast: Secondary | ICD-10-CM

## 2022-08-02 ENCOUNTER — Ambulatory Visit (INDEPENDENT_AMBULATORY_CARE_PROVIDER_SITE_OTHER): Payer: 59

## 2022-08-02 ENCOUNTER — Other Ambulatory Visit: Payer: Self-pay | Admitting: Nurse Practitioner

## 2022-08-02 DIAGNOSIS — Z780 Asymptomatic menopausal state: Secondary | ICD-10-CM

## 2022-08-02 DIAGNOSIS — M8589 Other specified disorders of bone density and structure, multiple sites: Secondary | ICD-10-CM

## 2022-08-02 DIAGNOSIS — Z13820 Encounter for screening for osteoporosis: Secondary | ICD-10-CM | POA: Diagnosis not present

## 2022-08-02 DIAGNOSIS — Z01419 Encounter for gynecological examination (general) (routine) without abnormal findings: Secondary | ICD-10-CM

## 2022-08-02 DIAGNOSIS — M85851 Other specified disorders of bone density and structure, right thigh: Secondary | ICD-10-CM

## 2022-08-04 ENCOUNTER — Encounter (HOSPITAL_BASED_OUTPATIENT_CLINIC_OR_DEPARTMENT_OTHER): Payer: Self-pay | Admitting: Orthopaedic Surgery

## 2022-08-05 ENCOUNTER — Ambulatory Visit (HOSPITAL_BASED_OUTPATIENT_CLINIC_OR_DEPARTMENT_OTHER): Payer: 59

## 2022-08-05 ENCOUNTER — Encounter (HOSPITAL_BASED_OUTPATIENT_CLINIC_OR_DEPARTMENT_OTHER): Payer: Self-pay | Admitting: Student

## 2022-08-05 ENCOUNTER — Ambulatory Visit (INDEPENDENT_AMBULATORY_CARE_PROVIDER_SITE_OTHER): Payer: 59 | Admitting: Student

## 2022-08-05 DIAGNOSIS — M79642 Pain in left hand: Secondary | ICD-10-CM

## 2022-08-05 DIAGNOSIS — M180 Bilateral primary osteoarthritis of first carpometacarpal joints: Secondary | ICD-10-CM

## 2022-08-05 DIAGNOSIS — M79641 Pain in right hand: Secondary | ICD-10-CM

## 2022-08-05 MED ORDER — TRIAMCINOLONE ACETONIDE 40 MG/ML IJ SUSP
1.0000 mL | INTRAMUSCULAR | Status: AC | PRN
Start: 2022-08-05 — End: 2022-08-05
  Administered 2022-08-05: 1 mL

## 2022-08-05 MED ORDER — LIDOCAINE HCL 1 % IJ SOLN
1.0000 mL | INTRAMUSCULAR | Status: AC | PRN
Start: 2022-08-05 — End: 2022-08-05
  Administered 2022-08-05: 1 mL

## 2022-08-05 NOTE — Progress Notes (Signed)
Chief Complaint: Bilateral hand pain     History of Present Illness:    Lakara Seavers is a 67 y.o. female presenting today for evaluation of pain in both hands.  She states that this has been slowly worsening over the last few months.  Notes that this is worse at the base of both thumbs.  Pain levels are mild to moderate and often feels like soreness. It is accompanied with some weakness of the hands and thumbs.  She has recently been playing a lot for games of cards with her dad which she feels like has accelerated this problem.  Also notes that she does occasionally get numbness throughout both hands and all fingers during the middle of the night.  She has been previously diagnosed with carpal tunnel syndrome.  Has been taking diclofenac but states that she does not want to overdo this due to it being hard on her GI system.   Surgical History:   None  PMH/PSH/Family History/Social History/Meds/Allergies:    Past Medical History:  Diagnosis Date   Anemia    years ago   Anxiety    Arthritis    Asthma    bronchitis   Back pain    lumbar   Carpal tunnel syndrome    Complication of anesthesia    Depression    Family history of adverse reaction to anesthesia    mother's blood pressure always drops    GERD (gastroesophageal reflux disease)    History of hiatal hernia    Hyperlipidemia    Numbness of right foot    Perimenopausal    PONV (postoperative nausea and vomiting)    Vitamin D deficiency    Past Surgical History:  Procedure Laterality Date   ABDOMINAL HYSTERECTOMY     DUB   CATARACT EXTRACTION W/PHACO Right 09/08/2021   Procedure: CATARACT EXTRACTION PHACO AND INTRAOCULAR LENS PLACEMENT (IOC) RIGHT;  Surgeon: Lockie Mola, MD;  Location: [facility];  Service: Ophthalmology;  Laterality: Right;  5.05 00:55.6   CATARACT EXTRACTION W/PHACO Left 09/22/2021   Procedure: CATARACT EXTRACTION PHACO AND INTRAOCULAR LENS  PLACEMENT (IOC) LEFT 5.87 00:55.5;  Surgeon: Lockie Mola, MD;  Location: [facility];  Service: Ophthalmology;  Laterality: Left;   CHOLECYSTECTOMY  06/19/2014   CHOLECYSTECTOMY N/A 06/19/2014   Procedure: LAPAROSCOPIC CHOLECYSTECTOMY WITH INTRAOPERATIVE CHOLANGIOGRAM;  Surgeon: Manus Rudd, MD;  Location: [facility];  Service: General;  Laterality: N/A;   CHONDROPLASTY Right 06/06/2019   Procedure: KNEE ARTHROSCOPY WITH CHONDROPLASTY;  Surgeon: Bjorn Pippin, MD;  Location: [facility];  Service: Orthopedics;  Laterality: Right;   COLONOSCOPY     KNEE ARTHROSCOPY WITH LATERAL MENISECTOMY Right 06/06/2019   Procedure: KNEE ARTHROSCOPY WITH LATERAL MENISECTOMY;  Surgeon: Bjorn Pippin, MD;  Location: [facility];  Service: Orthopedics;  Laterality: Right;   KNEE ARTHROSCOPY WITH MEDIAL COLLATERAL LIGAMENT RECONSTRUCTION Right 06/06/2019   Procedure: KNEE ARTHROSCOPY WITH MEDIAL COLLATERAL LIGAMENT RECONSTRUCTION;  Surgeon: Bjorn Pippin, MD;  Location: [facility];  Service: Orthopedics;  Laterality: Right;   LUMBAR LAMINECTOMY/DECOMPRESSION MICRODISCECTOMY Right 09/16/2015   Procedure: Right - Lumbar five-sacral one  lumbar laminotomy and microdiscectomy;  Surgeon: Shirlean Kelly, MD;  Location: [facility];  Service: Neurosurgery;  Laterality: Right;   OOPHORECTOMY  02/21/94   left  Social History   Socioeconomic History   Marital status: Divorced    Spouse name: Not on file   Number of children: 1   Years of education: 12   Highest education level: Not on file  Occupational History   Occupation: special needs care  Tobacco Use   Smoking status: Former    Types: Cigarettes    Quit date: 02/21/1988    Years since quitting: 34.4   Smokeless tobacco: Never  Vaping Use   Vaping Use: Never used  Substance and Sexual Activity   Alcohol use: Yes    Alcohol/week: 0.0 standard drinks of alcohol    Comment: occasional   Drug use: No    Sexual activity: Not Currently    Partners: Male    Birth control/protection: Post-menopausal    Comment: menarche 67yo, sexual debut 67yo  Other Topics Concern   Not on file  Social History Narrative   One story home   One child   Decaf mostly    Right handed    Family history: History of hypertension osteoporosis heart disease thyroid disorder and lung tumor as well as COPD in mother.  Father with history of hypertension heart disease and arthritis.       Social Determinants of Health   Financial Resource Strain: Not on file  Food Insecurity: Not on file  Transportation Needs: Not on file  Physical Activity: Not on file  Stress: Not on file  Social Connections: Not on file   Family History  Problem Relation Age of Onset   Coronary artery disease Mother    Heart attack Mother    Hypertension Mother    Colon polyps Father    Colon cancer Other    Allergies  Allergen Reactions   Hydrocodone Nausea And Vomiting   Oxycodone     Nausea, vomiting, dizzy   Codeine Nausea And Vomiting   Current Outpatient Medications  Medication Sig Dispense Refill   albuterol (VENTOLIN HFA) 108 (90 Base) MCG/ACT inhaler Inhale 2 puffs into the lungs every 6 (six) hours as needed for wheezing or shortness of breath. (Patient not taking: Reported on 07/13/2022) 8 g 0   ALPRAZolam (XANAX) 0.5 MG tablet TAKE 1 TABLET BY MOUTH TWICE DAILY AS NEEDED 60 tablet 2   buPROPion (WELLBUTRIN XL) 300 MG 24 hr tablet TAKE 1 TABLET BY MOUTH DAILY 90 tablet 1   Calcium Carbonate-Vit D-Min (CALCIUM 1200 PO) calcium     COLLAGEN PO Take 1 Scoop by mouth daily after breakfast.     Cyanocobalamin (B-12 PO) B12     diclofenac (VOLTAREN) 75 MG EC tablet Take 1 tablet (75 mg total) by mouth 2 (two) times daily. 30 tablet 4   famotidine (PEPCID) 20 MG tablet Take 1 tablet (20 mg total) by mouth 2 (two) times daily. 90 tablet 3   Multiple Vitamin (MULTIVITAMIN ADULT PO) multivitamin     ondansetron (ZOFRAN) 4 MG  tablet Take 1 tablet (4 mg total) by mouth every 8 (eight) hours as needed for nausea or vomiting. 20 tablet 0   traMADol (ULTRAM) 50 MG tablet Take 1 tablet (50 mg total) by mouth every 6 (six) hours as needed. 10 tablet 0   No current facility-administered medications for this visit.   No results found.  Review of Systems:   A ROS was performed including pertinent positives and negatives as documented in the HPI.  Physical Exam :   Constitutional: NAD and appears stated age Neurological: Alert and oriented Psych: Appropriate  affect and cooperative There were no vitals taken for this visit.   Comprehensive Musculoskeletal Exam:    Tenderness with palpation bilaterally throughout the thenar eminence and CMC joints.  Significantly decreased grip strength in both hands.  Negative Tinel's test at the wrist bilaterally.  Range of motion of both thumbs generally limited due to pain.  Negative Finklestein's test bilaterally.  Radial pulses 2+.  Distal neurosensory exam intact.  Imaging:   Xray (left hand 3 views, right hand 3 views): Bilateral arthritis of CMC joints.  Otherwise negative.   I personally reviewed and interpreted the radiographs.   Assessment:   67 y.o. female with pain in both hands specifically at the base of the thumbs.  Based on exam and x-ray findings I do suspect that this is a result of CMC joint arthritis.  We did discuss use of CMC braces moving forward however I also recommended cortisone injections as this would provide her with symptomatic relief.  Patient agreed to this and bilateral CMC joints were injected with cortisone under ultrasound guidance.  She may also have some component of carpal tunnel syndrome however testing for this today was negative.  We will see how much relief she gets with the injections and have her follow-up on an as-needed basis.  Can further evaluate for possible carpal tunnel or other nerve related issue should numbness continue.  Plan :     -Bilateral CMC injections today and follow-up as needed     Procedure Note  Patient: Britainy Nemeroff             Date of Birth: [birth year]           MRN: [id number]             Visit Date: 08/05/2022  Procedures: Visit Diagnoses:  1. Arthritis of carpometacarpal [facility]) joint of both thumbs     Hand/UE Inj: bilateral thumb CMC for osteoarthritis on 08/05/2022 12:41 PM Indications: pain Details: 25 G needle, ultrasound-guided dorsal approach Medications (Right): 1 mL lidocaine 1 %; 1 mL triamcinolone acetonide 40 MG/ML Medications (Left): 1 mL lidocaine 1 %; 1 mL triamcinolone acetonide 40 MG/ML Outcome: tolerated well, no immediate complications Procedure, treatment alternatives, risks and benefits explained, specific risks discussed. Consent was given by the patient. Patient was prepped and draped in the usual sterile fashion.      I personally saw and evaluated the patient, and participated in the management and treatment plan.  Hazle Nordmann, PA-C Orthopedics  This document was dictated using Conservation officer, historic buildings. A reasonable attempt at proof reading has been made to minimize errors.

## 2022-08-23 ENCOUNTER — Institutional Professional Consult (permissible substitution): Payer: 59 | Admitting: Nurse Practitioner

## 2022-08-23 ENCOUNTER — Encounter: Payer: Self-pay | Admitting: Primary Care

## 2022-08-23 ENCOUNTER — Ambulatory Visit (INDEPENDENT_AMBULATORY_CARE_PROVIDER_SITE_OTHER): Payer: 59 | Admitting: Primary Care

## 2022-08-23 VITALS — BP 130/74 | HR 72 | Temp 97.9°F | Ht 65.5 in | Wt 177.0 lb

## 2022-08-23 DIAGNOSIS — G4719 Other hypersomnia: Secondary | ICD-10-CM | POA: Diagnosis not present

## 2022-08-23 NOTE — Progress Notes (Signed)
Reviewed and agree with assessment/plan.   Martavis Gurney, MD [facility] 08/23/2022, 3:55 PM Pager:  [phone]  

## 2022-08-23 NOTE — Assessment & Plan Note (Addendum)
-   Patient has symptoms of excessive daytime sleepiness. Associated restless sleep. Epworth 14. Concern patient has underlying sleep apnea, needs home sleep study to evaluate.  Reviewed risks of untreated sleep apnea including cardiac arrhythmias, pulmonary hypertension, diabetes and stroke.  We also discussed treatment options including weight loss, oral appliance, CPAP therapy referral to ENT for possible surgical options.  Encourage side sleeping position.  Advised against driving if experiencing excessive daytime sleepiness or fatigue. Checking iron panel due to fatigue.  Follow-up 1 to 2 weeks after sleep study to review results and treatment options if needed.

## 2022-08-23 NOTE — Progress Notes (Signed)
@Patient  ID: Patricia Ferrell, female    DOB: 1955-11-02, 67 y.o.   MRN: [id number]  Chief Complaint  Patient presents with   Consult    Sleep consult.     Referring provider: Margaree Mackintosh, MD  HPI: 67 year old female, former smoker. PMH significant for asthma, allergic rhinitis, anxiety, hyperlipidemia.   08/23/2022 Patient presents today for sleep consult. She reports waking up several times throughout the night. She does not feel well rested when she wakes up in the morning. Symptoms have been going on for > 6 months. She recently lost her mother and brother. She re-started Wellbutrin XL 300mg . She has trouble falling asleep at night due to racing thoughts. Leg cramps will wake her up. Typical bedtime is 10 PM to 12 AM.  It can take her between 30 to 60 minutes to fall asleep.  She wakes up 2-3 times a night.  She starts her day between 7 and 8 AM.  Her weight fluctuates 5 to 10 pounds. CBC and TSH were normal in May. No previous sleep studies.  She does not currently wear CPAP or oxygen.  Epworth score is 14. No concern for narcolepsy, cataplexy or sleepwalking. No overt asthma symptoms.  Sleep questionnaire Symptoms-  restless sleep, daytime sleepiness  Prior sleep study- none Bedtime- 10pm-12am Time to fall asleep- 30-60 mins Nocturnal awakenings- 2-3 times  Out of bed/start of day- 7-8am Weight changes- 5-10lbs Do you operate heavy machinery- no Do you currently wear CPAP- no Do you current wear oxygen- no Epworth- 14   Allergies  Allergen Reactions   Hydrocodone Nausea And Vomiting   Oxycodone     Nausea, vomiting, dizzy   Codeine Nausea And Vomiting    Immunization History  Administered Date(s) Administered   Janssen (J&J) SARS-COV-2 Vaccination 06/01/2019   Td 02/06/2008   Tdap 09/14/2021    Past Medical History:  Diagnosis Date   Anemia    years ago   Anxiety    Arthritis    Asthma    bronchitis   Back pain    lumbar   Carpal tunnel syndrome     Complication of anesthesia    Depression    Family history of adverse reaction to anesthesia    mother's blood pressure always drops    GERD (gastroesophageal reflux disease)    History of hiatal hernia    Hyperlipidemia    Numbness of right foot    Perimenopausal    PONV (postoperative nausea and vomiting)    Vitamin D deficiency     Tobacco History: Social History   Tobacco Use  Smoking Status Former   Types: Cigarettes   Quit date: 02/21/1988   Years since quitting: 34.5  Smokeless Tobacco Never   Counseling given: Not Answered   Outpatient Medications Prior to Visit  Medication Sig Dispense Refill   albuterol (VENTOLIN HFA) 108 (90 Base) MCG/ACT inhaler Inhale 2 puffs into the lungs every 6 (six) hours as needed for wheezing or shortness of breath. 8 g 0   ALPRAZolam (XANAX) 0.5 MG tablet TAKE 1 TABLET BY MOUTH TWICE DAILY AS NEEDED 60 tablet 2   buPROPion (WELLBUTRIN XL) 300 MG 24 hr tablet TAKE 1 TABLET BY MOUTH DAILY 90 tablet 1   Calcium Carbonate-Vit D-Min (CALCIUM 1200 PO) calcium     COLLAGEN PO Take 1 Scoop by mouth daily after breakfast.     Cyanocobalamin (B-12 PO) B12     diclofenac (VOLTAREN) 75 MG EC tablet Take  1 tablet (75 mg total) by mouth 2 (two) times daily. 30 tablet 4   famotidine (PEPCID) 20 MG tablet Take 1 tablet (20 mg total) by mouth 2 (two) times daily. 90 tablet 3   Multiple Vitamin (MULTIVITAMIN ADULT PO) multivitamin     ondansetron (ZOFRAN) 4 MG tablet Take 1 tablet (4 mg total) by mouth every 8 (eight) hours as needed for nausea or vomiting. 20 tablet 0   traMADol (ULTRAM) 50 MG tablet Take 1 tablet (50 mg total) by mouth every 6 (six) hours as needed. 10 tablet 0   No facility-administered medications prior to visit.   Review of Systems  Review of Systems  Constitutional:  Positive for fatigue.  HENT: Negative.    Respiratory: Negative.    Cardiovascular: Negative.   Psychiatric/Behavioral:  Positive for sleep disturbance.     Physical Exam  BP 130/74 (BP Location: Left Arm, Patient Position: Sitting, Cuff Size: Large)   Pulse 72   Temp 97.9 F (36.6 C) (Oral)   Ht 5' 5.5" (1.664 m)   Wt 177 lb (80.3 kg)   SpO2 99%   BMI 29.01 kg/m  Physical Exam Constitutional:      Appearance: Normal appearance.  HENT:     Head: Normocephalic and atraumatic.     Mouth/Throat:     Mouth: Mucous membranes are moist.     Pharynx: Oropharynx is clear.  Cardiovascular:     Rate and Rhythm: Normal rate and regular rhythm.  Pulmonary:     Effort: Pulmonary effort is normal.     Breath sounds: Normal breath sounds.  Musculoskeletal:        General: Normal range of motion.     Cervical back: Normal range of motion and neck supple.  Skin:    General: Skin is warm and dry.  Neurological:     General: No focal deficit present.     Mental Status: She is alert and oriented to person, place, and time. Mental status is at baseline.  Psychiatric:        Mood and Affect: Mood normal.        Behavior: Behavior normal.        Thought Content: Thought content normal.        Judgment: Judgment normal.      Lab Results:  CBC    Component Value Date/Time   WBC 6.3 06/28/2022 0955   RBC 4.68 06/28/2022 0955   HGB 13.3 06/28/2022 0955   HCT 40.1 06/28/2022 0955   PLT 270 06/28/2022 0955   MCV 85.7 06/28/2022 0955   MCH 28.4 06/28/2022 0955   MCHC 33.2 06/28/2022 0955   RDW 13.1 06/28/2022 0955   LYMPHSABS 2,413 06/28/2022 0955   MONOABS 0.6 06/17/2014 0750   EOSABS 176 06/28/2022 0955   BASOSABS 69 06/28/2022 0955    BMET    Component Value Date/Time   NA 143 06/28/2022 0955   K 4.6 06/28/2022 0955   CL 106 06/28/2022 0955   CO2 26 06/28/2022 0955   GLUCOSE 101 (H) 06/28/2022 0955   BUN 21 06/28/2022 0955   CREATININE 1.05 06/28/2022 0955   CALCIUM 10.0 06/28/2022 0955   GFRNONAA 70 03/05/2020 0951   GFRAA 82 03/05/2020 0951    BNP No results found for: "BNP"  ProBNP No results found for:  "PROBNP"  Imaging: DG Hand Complete Right  Result Date: 08/11/2022 CLINICAL DATA:  Hand pain.  No known injury. EXAM: RIGHT HAND - COMPLETE 3+ VIEW COMPARISON:  Right  wrist radiographs 06/03/2013 FINDINGS: Mild-to-moderate thumb carpometacarpal joint space narrowing, subchondral sclerosis, and peripheral osteophytosis. Mild second and third greater than fourth and fifth DIP, mild second PIP, and minimal third and fourth PIP joint space narrowing and peripheral osteophytosis. No acute fracture or dislocation. 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 Electronically Signed   By: Neita Garnet M.D.   On: 08/11/2022 08:47   DG Hand Complete Left  Result Date: 08/11/2022 CLINICAL DATA:  Bilateral hand pain.  No known injury. EXAM: LEFT HAND - COMPLETE 3+ VIEW COMPARISON:  None Available. FINDINGS: Neutral ulnar variance. Mild-to-moderate thumb carpometacarpal joint space narrowing, subchondral sclerosis, and peripheral osteophytosis. Mild to moderate index finger PIP and mild second through fifth DIP joint space narrowing and peripheral osteophytosis. No acute fracture or dislocation. IMPRESSION: Mild-to-moderate thumb carpometacarpal and index finger PIP osteoarthritis. Electronically Signed   By: Neita Garnet M.D.   On: 08/11/2022 08:45   MM 3D SCREENING MAMMOGRAM BILATERAL BREAST  Result Date: 07/29/2022 CLINICAL DATA:  Screening. EXAM: DIGITAL SCREENING BILATERAL MAMMOGRAM WITH TOMOSYNTHESIS AND CAD TECHNIQUE: Bilateral screening digital craniocaudal and mediolateral oblique mammograms were obtained. Bilateral screening digital breast tomosynthesis was performed. The images were evaluated with computer-aided detection. COMPARISON:  Previous exam(s). ACR Breast Density Category a: The breasts are almost entirely fatty. FINDINGS: There are no findings suspicious for malignancy. IMPRESSION: No mammographic evidence of malignancy. A result letter of  this screening mammogram will be mailed directly to the patient. RECOMMENDATION: Screening mammogram in one year. (Code:[id number]-B-01Y) BI-RADS CATEGORY  1: Negative. Electronically Signed   By: Amie Portland M.D.   On: 07/29/2022 12:58     Assessment & Plan:   Excessive daytime sleepiness - Patient has symptoms of excessive daytime sleepiness. Associated restless sleep. Epworth 14. Concern patient has underlying sleep apnea, needs home sleep study to evaluate.  Reviewed risks of untreated sleep apnea including cardiac arrhythmias, pulmonary hypertension, diabetes and stroke.  We also discussed treatment options including weight loss, oral appliance, CPAP therapy referral to ENT for possible surgical options.  Encourage side sleeping position.  Advised against driving if experiencing excessive daytime sleepiness or fatigue. Checking iron panel due to fatigue.  Follow-up 1 to 2 weeks after sleep study to review results and treatment options if needed.     Glenford Bayley, NP 08/23/2022

## 2022-08-23 NOTE — Patient Instructions (Addendum)
  Sleep apnea is defined as period of 10 seconds or longer when you stop breathing at night. This can happen multiple times a night. Dx sleep apnea is when this occurs more than 5 times an hour.    Mild OSA 5-15 apneic events an hour Moderate OSA 15-30 apneic events an hour Severe OSA > 30 apneic events an hour   Untreated sleep apnea puts you at higher risk for cardiac arrhythmias, pulmonary HTN, stroke and diabetes  Treatment options include weight loss, side sleeping position, oral appliance, CPAP therapy or referral to ENT for possible surgical options    Recommendations: - Continue Wellbutrin 300mg  XL - Consider having primary care refer you to behavioral health for therapy/counseling  - Try over the counter melatonin 3-5 mg or tylenol PM for occasional insomnia- if symptoms occurring >2-3 times a week we can try prescription sleep aid  - Focus on side sleeping position or elevate head with wedge pillow 30 degrees - Work on weight loss efforts if able  - Do not drive if experiencing excessive daytime sleepiness of fatigue    Orders: Home sleep study re: loud snoring  Labs (iron panel)    Follow-up: Please call to schedule follow-up 1-2 weeks after completing home sleep study to review results and treatment if needed (can be virtual)

## 2022-08-24 LAB — IRON,TIBC AND FERRITIN PANEL
%SAT: 28 % (calc) (ref 16–45)
Ferritin: 83 ng/mL (ref 16–288)
Iron: 102 ug/dL (ref 45–160)
TIBC: 365 mcg/dL (calc) (ref 250–450)

## 2022-09-06 ENCOUNTER — Other Ambulatory Visit: Payer: Self-pay

## 2022-09-14 NOTE — Telephone Encounter (Signed)
Thanks

## 2022-09-14 NOTE — Telephone Encounter (Signed)
Just got this pt approved for HST   [id number] valid til 12/13/22  Patricia Ferrell is calling her to sch

## 2022-09-21 ENCOUNTER — Telehealth: Payer: Self-pay | Admitting: Nurse Practitioner

## 2022-09-21 NOTE — Telephone Encounter (Signed)
Holly called patient and resch the patient

## 2022-09-21 NOTE — Telephone Encounter (Signed)
Patient is returning a call regarding a call to reschedule her sleep appt.  Please call patient back at [phone]

## 2022-09-22 ENCOUNTER — Ambulatory Visit: Payer: 59

## 2022-09-22 DIAGNOSIS — G4719 Other hypersomnia: Secondary | ICD-10-CM

## 2022-09-22 DIAGNOSIS — R0683 Snoring: Secondary | ICD-10-CM | POA: Diagnosis not present

## 2022-09-28 DIAGNOSIS — R0683 Snoring: Secondary | ICD-10-CM

## 2022-10-07 ENCOUNTER — Ambulatory Visit (HOSPITAL_COMMUNITY): Admit: 2022-10-07 | Payer: 59 | Admitting: Orthopaedic Surgery

## 2022-10-07 DIAGNOSIS — M1711 Unilateral primary osteoarthritis, right knee: Secondary | ICD-10-CM

## 2022-10-07 SURGERY — ARTHROPLASTY, KNEE, TOTAL
Anesthesia: Spinal | Site: Knee | Laterality: Right

## 2022-10-18 ENCOUNTER — Other Ambulatory Visit (HOSPITAL_BASED_OUTPATIENT_CLINIC_OR_DEPARTMENT_OTHER): Payer: Self-pay | Admitting: Orthopaedic Surgery

## 2022-10-18 ENCOUNTER — Encounter (HOSPITAL_BASED_OUTPATIENT_CLINIC_OR_DEPARTMENT_OTHER): Payer: Self-pay | Admitting: Orthopaedic Surgery

## 2022-10-18 MED ORDER — DICLOFENAC SODIUM 75 MG PO TBEC: 75.0000 mg | DELAYED_RELEASE_TABLET | Freq: Two times a day (BID) | ORAL | 4 refills | Status: DC

## 2022-10-20 ENCOUNTER — Encounter: Payer: 59 | Admitting: Orthopaedic Surgery

## 2022-10-25 ENCOUNTER — Ambulatory Visit: Payer: 59 | Admitting: Primary Care

## 2022-12-14 ENCOUNTER — Other Ambulatory Visit: Payer: Self-pay | Admitting: Internal Medicine

## 2023-01-02 NOTE — Progress Notes (Signed)
Patient Care Team: Margaree Mackintosh, MD as PCP - General (Internal Medicine) Drema Dallas, DO as Consulting Physician (Neurology) Olivia Mackie, NP as Nurse Practitioner (Gynecology)  Visit Date: 01/09/23  Subjective:    Patient ID: Patricia Ferrell , Female   DOB: 04/19/1955, 67 y.o.    MRN: [id number]   67 y.o. Female presents today for a 51-month follow-up.   History of anxiety and depression treated with alprazolam 0.5 mg twice daily as needed, bupropion 300 mg daily. She has not needed alprazolam recently. She has tapered herself off of bupropion.  History of lumbar back pain treated with diclofenac 75 mg twice daily. History of osteoarthritis right knee. Had lumbar surgery July 2017. Received injection from Dr. Steward Drone 07/01/22. 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 Reports back pain is stable.   Has chronic L5-S1 radiculopathy of the right lower extremity and right leg numbness.  Has seen Dr. Everlena Cooper, Neurologist and had nerve conduction studies in June 2023.  History of right facial numbness in 2020 and also saw Dr. Everlena Cooper.  This resolved.     History of GERD treated with famotidine 20 mg twice daily.  History of hyperlipidemia. Does not wish to be on statin medication. CHOL elevated at 300, TRIG elevated at 164, LDL elevated at 200 on 01/06/23, up from 258, 131, 166 on 06/28/22. Liver functions normal. She was eating significant amount of candy bars for two weeks leading up to blood work. 10/18/21 coronary calcium score at 0.   Mammogram last completed 02/18/21. No mammographic evidence of malignancy. Recommended repeat in 2023.   Colonoscopy last completed 12/11/17. Entire examined colon was normal. Repeat recommended in 2026.   Bone density study in 2019 had a T score of -1.2 in the right femoral neck and -1.3 in the left femoral neck. HP spine score was -1.0 and she was diagnosed with  osteopenia. Taking calcium, Vitamin D supplement.  Past Medical History:  Diagnosis Date   Anemia    years ago   Anxiety    Arthritis    Asthma    bronchitis   Back pain    lumbar   Carpal tunnel syndrome    Complication of anesthesia    Depression    Family history of adverse reaction to anesthesia    mother's blood pressure always drops    GERD (gastroesophageal reflux disease)    History of hiatal hernia    Hyperlipidemia    Numbness of right foot    Perimenopausal    PONV (postoperative nausea and vomiting)    Vitamin D deficiency      Family History  Problem Relation Age of Onset   Coronary artery disease Mother    Heart attack Mother    Hypertension Mother    Colon polyps Father    Colon cancer Other     Social History   Social History Narrative   One story home   One child   Decaf mostly    Right handed    Family history: History of hypertension osteoporosis heart disease thyroid disorder and lung tumor as well as COPD in mother.  Father with history of hypertension heart disease and arthritis.          Review of Systems  Constitutional:  Negative for fever and malaise/fatigue.  HENT:  Negative for congestion.   Eyes:  Negative for blurred vision.  Respiratory:  Negative for cough  and shortness of breath.   Cardiovascular:  Negative for chest pain, palpitations and leg swelling.  Gastrointestinal:  Negative for vomiting.  Musculoskeletal:  Negative for back pain.  Skin:  Negative for rash.  Neurological:  Negative for loss of consciousness and headaches.        Objective:   Vitals: BP 102/80   Pulse 72   Ht 5' 5.5" (1.664 m)   Wt 180 lb (81.6 kg)   SpO2 96%   BMI 29.50 kg/m    Physical Exam Vitals and nursing note reviewed.  Constitutional:      General: She is not in acute distress.    Appearance: Normal appearance. She is not toxic-appearing.  HENT:     Head: Normocephalic and atraumatic.  Pulmonary:     Effort: Pulmonary  effort is normal.  Skin:    General: Skin is warm and dry.  Neurological:     Mental Status: She is alert and oriented to person, place, and time. Mental status is at baseline.  Psychiatric:        Mood and Affect: Mood normal.        Behavior: Behavior normal.        Thought Content: Thought content normal.        Judgment: Judgment normal.       Results:   Studies obtained and personally reviewed by me:  10/18/21 coronary calcium score at 0.  Mammogram last completed 02/18/21. No mammographic evidence of malignancy. Recommended repeat in 2023.   Colonoscopy last completed 12/11/17. Entire examined colon was normal. Repeat recommended in 2026.   Bone density study in 2019 had a T score of -1.2 in the right femoral neck and -1.3 in the left femoral neck. HP spine score was -1.0 and she was diagnosed with osteopenia. Taking calcium, Vitamin D supplement.  Labs:       Component Value Date/Time   NA 143 06/28/2022 0955   K 4.6 06/28/2022 0955   CL 106 06/28/2022 0955   CO2 26 06/28/2022 0955   GLUCOSE 101 (H) 06/28/2022 0955   BUN 21 06/28/2022 0955   CREATININE 1.05 06/28/2022 0955   CALCIUM 10.0 06/28/2022 0955   PROT 6.7 01/06/2023 0942   ALBUMIN 4.2 10/06/2015 1559   AST 14 01/06/2023 0942   ALT 16 01/06/2023 0942   ALKPHOS 96 10/06/2015 1559   BILITOT 0.4 01/06/2023 0942   GFRNONAA 70 03/05/2020 0951   GFRAA 82 03/05/2020 0951     Lab Results  Component Value Date   WBC 6.3 06/28/2022   HGB 13.3 06/28/2022   HCT 40.1 06/28/2022   MCV 85.7 06/28/2022   PLT 270 06/28/2022    Lab Results  Component Value Date   CHOL 300 (H) 01/06/2023   HDL 67 01/06/2023   LDLCALC 200 (H) 01/06/2023   LDLDIRECT 135.2 04/10/2009   TRIG 164 (H) 01/06/2023   CHOLHDL 4.5 01/06/2023    No results found for: "HGBA1C"   Lab Results  Component Value Date   TSH 1.93 06/28/2022      Assessment & Plan:   Anxiety and depression: stable with alprazolam 0.5 mg twice daily  as needed.  Musculoskeletal pain: stable with diclofenac 75 mg twice daily.   GERD: stable with famotidine 20 mg twice daily.  Hyperlipidemia: she will work on controlling with diet and exercise and prefers to postpone starting lipid lowering medication. CHOL elevated at 300, TRIG elevated at 164, LDL elevated at 200 on 01/06/23, up from 258, 131,  166 on 06/28/22. Liver functions normal. 10/18/21 coronary calcium score at 0.  Ordered A1c given recent diet habits. Hgb AIC upper limits of normal at 5.7% needs to watch carbohydrate intake as well as fat intake in diet. Does not want to be on medication for glucose control. Follow up in 6 months   Mammogram last completed 02/18/21. No mammographic evidence of malignancy. Recommended repeat in 2023.   Colonoscopy last completed 12/11/17. Entire examined colon was normal. Repeat recommended in 2026.   Bone density study in 2019 had a T score of -1.2 in the right femoral neck and -1.3 in the left femoral neck. HP spine score was -1.0 and she was diagnosed with osteopenia. Taking calcium, Vitamin D supplement.  Return in 6 months for health maintenance exam or as needed.    I,Alexander Ruley,acting as a Neurosurgeon for Margaree Mackintosh, MD.,have documented all relevant documentation on the behalf of Margaree Mackintosh, MD,as directed by  Margaree Mackintosh, MD while in the presence of Margaree Mackintosh, MD.   I, Margaree Mackintosh, MD, have reviewed all documentation for this visit. The documentation on 01/20/23 for the exam, diagnosis, procedures, and orders are all accurate and complete.

## 2023-01-06 ENCOUNTER — Other Ambulatory Visit: Payer: 59

## 2023-01-06 DIAGNOSIS — E782 Mixed hyperlipidemia: Secondary | ICD-10-CM

## 2023-01-07 LAB — HEPATIC FUNCTION PANEL
AG Ratio: 1.9 (calc) (ref 1.0–2.5)
ALT: 16 U/L (ref 6–29)
AST: 14 U/L (ref 10–35)
Albumin: 4.4 g/dL (ref 3.6–5.1)
Alkaline phosphatase (APISO): 93 U/L (ref 37–153)
Bilirubin, Direct: 0.1 mg/dL (ref 0.0–0.2)
Globulin: 2.3 g/dL (ref 1.9–3.7)
Indirect Bilirubin: 0.3 mg/dL (ref 0.2–1.2)
Total Bilirubin: 0.4 mg/dL (ref 0.2–1.2)
Total Protein: 6.7 g/dL (ref 6.1–8.1)

## 2023-01-07 LAB — LIPID PANEL
Cholesterol: 300 mg/dL — ABNORMAL HIGH (ref <200)
HDL: 67 mg/dL (ref >=50)
LDL Cholesterol (Calc): 200 mg/dL — ABNORMAL HIGH
Non-HDL Cholesterol (Calc): 233 mg/dL — ABNORMAL HIGH (ref <130)
Total CHOL/HDL Ratio: 4.5 (calc) (ref <5.0)
Triglycerides: 164 mg/dL — ABNORMAL HIGH (ref <150)

## 2023-01-09 ENCOUNTER — Ambulatory Visit (INDEPENDENT_AMBULATORY_CARE_PROVIDER_SITE_OTHER): Payer: 59 | Admitting: Internal Medicine

## 2023-01-09 ENCOUNTER — Ambulatory Visit: Payer: 59 | Admitting: Internal Medicine

## 2023-01-09 ENCOUNTER — Encounter: Payer: Self-pay | Admitting: Internal Medicine

## 2023-01-09 VITALS — BP 102/80 | HR 72 | Ht 65.5 in | Wt 180.0 lb

## 2023-01-09 DIAGNOSIS — R7309 Other abnormal glucose: Secondary | ICD-10-CM | POA: Diagnosis not present

## 2023-01-09 DIAGNOSIS — R7302 Impaired glucose tolerance (oral): Secondary | ICD-10-CM

## 2023-01-09 DIAGNOSIS — E782 Mixed hyperlipidemia: Secondary | ICD-10-CM | POA: Diagnosis not present

## 2023-01-09 DIAGNOSIS — F419 Anxiety disorder, unspecified: Secondary | ICD-10-CM

## 2023-01-09 DIAGNOSIS — K219 Gastro-esophageal reflux disease without esophagitis: Secondary | ICD-10-CM

## 2023-01-09 DIAGNOSIS — F32A Depression, unspecified: Secondary | ICD-10-CM

## 2023-01-10 LAB — HEMOGLOBIN A1C
Hgb A1c MFr Bld: 5.7 %{Hb} — ABNORMAL HIGH
Mean Plasma Glucose: 117 mg/dL
eAG (mmol/L): 6.5 mmol/L

## 2023-01-20 NOTE — Patient Instructions (Addendum)
Hemoglobin A1c is elevated at 5.7% which is the upper limits of normal.  We call this impaired glucose tolerance and not diabetes mellitus.  Please watch her diet.  We now have issues with cholesterol and glucose control.  Patient does not want to be on medication for either.  Follow-up in 6 months.  She has tapered off of Wellbutrin and has not needed alprazolam recently.  Continues reflux medication (famotidine twice daily).  May continue diclofenac for musculoskeletal pain.

## 2023-01-26 ENCOUNTER — Encounter (HOSPITAL_BASED_OUTPATIENT_CLINIC_OR_DEPARTMENT_OTHER): Payer: Self-pay | Admitting: Student

## 2023-01-26 ENCOUNTER — Ambulatory Visit (HOSPITAL_BASED_OUTPATIENT_CLINIC_OR_DEPARTMENT_OTHER): Payer: 59 | Admitting: Student

## 2023-01-26 DIAGNOSIS — M79642 Pain in left hand: Secondary | ICD-10-CM | POA: Diagnosis not present

## 2023-01-26 DIAGNOSIS — M79641 Pain in right hand: Secondary | ICD-10-CM | POA: Diagnosis not present

## 2023-01-26 DIAGNOSIS — M180 Bilateral primary osteoarthritis of first carpometacarpal joints: Secondary | ICD-10-CM | POA: Diagnosis not present

## 2023-01-26 MED ORDER — LIDOCAINE HCL 1 % IJ SOLN
1.0000 mL | INTRAMUSCULAR | Status: AC | PRN
Start: 2023-01-26 — End: 2023-01-26
  Administered 2023-01-26: 1 mL

## 2023-01-26 MED ORDER — TRIAMCINOLONE ACETONIDE 40 MG/ML IJ SUSP
40.0000 mg | INTRAMUSCULAR | Status: AC | PRN
Start: 2023-01-26 — End: 2023-01-26
  Administered 2023-01-26: 40 mg

## 2023-01-26 NOTE — Progress Notes (Signed)
Chief Complaint: Bilateral hand pain     History of Present Illness:   01/26/23: Patient is here today for follow-up of bilateral CMC arthritis.  She was here on 08/05/2022 for bilateral CMC injections.  She states that this helped significantly and she has had no pain in her thumbs or hands.  This relief as began to wear off over the past few days.  She has noticed this well gripping the steering wheel while driving and playing cards.   Patricia Ferrell is a 67 y.o. female presenting today for evaluation of pain in both hands.  She states that this has been slowly worsening over the last few months.  Notes that this is worse at the base of both thumbs.  Pain levels are mild to moderate and often feels like soreness. It is accompanied with some weakness of the hands and thumbs.  She has recently been playing a lot for games of cards with her dad which she feels like has accelerated this problem.  Also notes that she does occasionally get numbness throughout both hands and all fingers during the middle of the night.  She has been previously diagnosed with carpal tunnel syndrome.  Has been taking diclofenac but states that she does not want to overdo this due to it being hard on her GI system.   Surgical History:   None  PMH/PSH/Family History/Social History/Meds/Allergies:    Past Medical History:  Diagnosis Date   Anemia    years ago   Anxiety    Arthritis    Asthma    bronchitis   Back pain    lumbar   Carpal tunnel syndrome    Complication of anesthesia    Depression    Family history of adverse reaction to anesthesia    mother's blood pressure always drops    GERD (gastroesophageal reflux disease)    History of hiatal hernia    Hyperlipidemia    Numbness of right foot    Perimenopausal    PONV (postoperative nausea and vomiting)    Vitamin D deficiency    Past Surgical History:  Procedure Laterality Date   ABDOMINAL HYSTERECTOMY      DUB   CATARACT EXTRACTION W/PHACO Right 09/08/2021   Procedure: CATARACT EXTRACTION PHACO AND INTRAOCULAR LENS PLACEMENT (IOC) RIGHT;  Surgeon: Lockie Mola, MD;  Location: [facility];  Service: Ophthalmology;  Laterality: Right;  5.05 00:55.6   CATARACT EXTRACTION W/PHACO Left 09/22/2021   Procedure: CATARACT EXTRACTION PHACO AND INTRAOCULAR LENS PLACEMENT (IOC) LEFT 5.87 00:55.5;  Surgeon: Lockie Mola, MD;  Location: [facility];  Service: Ophthalmology;  Laterality: Left;   CHOLECYSTECTOMY  06/19/2014   CHOLECYSTECTOMY N/A 06/19/2014   Procedure: LAPAROSCOPIC CHOLECYSTECTOMY WITH INTRAOPERATIVE CHOLANGIOGRAM;  Surgeon: Manus Rudd, MD;  Location: [facility];  Service: General;  Laterality: N/A;   CHONDROPLASTY Right 06/06/2019   Procedure: KNEE ARTHROSCOPY WITH CHONDROPLASTY;  Surgeon: Bjorn Pippin, MD;  Location: [facility];  Service: Orthopedics;  Laterality: Right;   COLONOSCOPY     KNEE ARTHROSCOPY WITH LATERAL MENISECTOMY Right 06/06/2019   Procedure: KNEE ARTHROSCOPY WITH LATERAL MENISECTOMY;  Surgeon: Bjorn Pippin, MD;  Location: [facility];  Service: Orthopedics;  Laterality: Right;   KNEE ARTHROSCOPY WITH MEDIAL COLLATERAL LIGAMENT RECONSTRUCTION Right 06/06/2019   Procedure: KNEE ARTHROSCOPY  WITH MEDIAL COLLATERAL LIGAMENT RECONSTRUCTION;  Surgeon: Bjorn Pippin, MD;  Location: [facility];  Service: Orthopedics;  Laterality: Right;   LUMBAR LAMINECTOMY/DECOMPRESSION MICRODISCECTOMY Right 09/16/2015   Procedure: Right - Lumbar five-sacral one  lumbar laminotomy and microdiscectomy;  Surgeon: Shirlean Kelly, MD;  Location: [facility];  Service: Neurosurgery;  Laterality: Right;   OOPHORECTOMY  02/21/94   left   Social History   Socioeconomic History   Marital status: Divorced    Spouse name: Not on file   Number of children: 1   Years of education: 12   Highest education level: Some college, no degree   Occupational History   Occupation: special needs care  Tobacco Use   Smoking status: Former    Current packs/day: 0.00    Types: Cigarettes    Quit date: 02/21/1988    Years since quitting: 34.9   Smokeless tobacco: Never  Vaping Use   Vaping status: Never Used  Substance and Sexual Activity   Alcohol use: Yes    Alcohol/week: 0.0 standard drinks of alcohol    Comment: occasional   Drug use: No   Sexual activity: Not Currently    Partners: Male    Birth control/protection: Post-menopausal    Comment: menarche 67yo, sexual debut 67yo  Other Topics Concern   Not on file  Social History Narrative   One story home   One child   Decaf mostly    Right handed    Family history: History of hypertension osteoporosis heart disease thyroid disorder and lung tumor as well as COPD in mother.  Father with history of hypertension heart disease and arthritis.       Social Determinants of Health   Financial Resource Strain: Low Risk  (01/06/2023)   Overall Financial Resource Strain (CARDIA)    Difficulty of Paying Living Expenses: Not very hard  Food Insecurity: No Food Insecurity (01/06/2023)   Hunger Vital Sign    Worried About Running Out of Food in the Last Year: Never true    Ran Out of Food in the Last Year: Never true  Transportation Needs: No Transportation Needs (01/06/2023)   PRAPARE - Administrator, Civil Service (Medical): No    Lack of Transportation (Non-Medical): No  Physical Activity: Insufficiently Active (01/06/2023)   Exercise Vital Sign    Days of Exercise per Week: 2 days    Minutes of Exercise per Session: 30 min  Stress: Stress Concern Present (01/06/2023)   [employer] of Occupational Health - Occupational Stress Questionnaire    Feeling of Stress : To some extent  Social Connections: Socially Isolated (01/06/2023)   Social Connection and Isolation Panel [NHANES]    Frequency of Communication with Friends and Family: More than three times  a week    Frequency of Social Gatherings with Friends and Family: More than three times a week    Attends Religious Services: Never    Database administrator or Organizations: No    Attends Engineer, structural: Not on file    Marital Status: Divorced   Family History  Problem Relation Age of Onset   Coronary artery disease Mother    Heart attack Mother    Hypertension Mother    Colon polyps Father    Colon cancer Other    Allergies  Allergen Reactions   Hydrocodone Nausea And Vomiting   Oxycodone     Nausea, vomiting, dizzy   Codeine Nausea And Vomiting   Current Outpatient  Medications  Medication Sig Dispense Refill   albuterol (VENTOLIN HFA) 108 (90 Base) MCG/ACT inhaler Inhale 2 puffs into the lungs every 6 (six) hours as needed for wheezing or shortness of breath. 8 g 0   ALPRAZolam (XANAX) 0.5 MG tablet TAKE 1 TABLET BY MOUTH TWICE DAILY AS NEEDED (Patient not taking: Reported on 01/09/2023) 60 tablet 2   buPROPion (WELLBUTRIN XL) 300 MG 24 hr tablet TAKE 1 TABLET BY MOUTH DAILY (Patient not taking: Reported on 01/09/2023) 90 tablet 1   Calcium Carbonate-Vit D-Min (CALCIUM 1200 PO) calcium     COLLAGEN PO Take 1 Scoop by mouth daily after breakfast.     Cyanocobalamin (B-12 PO) B12     diclofenac (VOLTAREN) 75 MG EC tablet Take 1 tablet (75 mg total) by mouth 2 (two) times daily. 30 tablet 4   famotidine (PEPCID) 20 MG tablet Take 1 tablet (20 mg total) by mouth 2 (two) times daily. 90 tablet 3   Multiple Vitamin (MULTIVITAMIN ADULT PO) multivitamin     ondansetron (ZOFRAN) 4 MG tablet Take 1 tablet (4 mg total) by mouth every 8 (eight) hours as needed for nausea or vomiting. 20 tablet 0   No current facility-administered medications for this visit.   No results found.  Review of Systems:   A ROS was performed including pertinent positives and negatives as documented in the HPI.  Physical Exam :   Constitutional: NAD and appears stated age Neurological:  Alert and oriented Psych: Appropriate affect and cooperative There were no vitals taken for this visit.   Comprehensive Musculoskeletal Exam:    Tenderness over the first CMC joints of bilateral hands with positive grind test.  Decrease in grip strength bilaterally.  Full active ROM of both wrists without discomfort.  Radial pulses are 2+.  Distal sensation intact.  Imaging:    Assessment:   67 y.o. female with mild to moderate CMC joint arthritis of both hands.  She got complete relief from injections performed about 6 months ago which has just recently began to wear off over the past few days.  She is here for repeat injections which we will proceed with today.  First CMC injections using 1 mL Kenalog and 1 mL lidocaine were performed today and she tolerated this well.  Will have her return for further assessment should symptoms return and she understands that we can repeat these no sooner than 3 months from today.  Plan :    -Bilateral CMC injections performed today and follow-up as needed     Procedure Note  Patient: Arine Luks             Date of Birth: 1955/11/29           MRN: [id number]             Visit Date: 01/26/2023  Procedures: Visit Diagnoses:  1. Arthritis of carpometacarpal [facility]) joint of both thumbs      Hand/UE Inj: bilateral thumb CMC for osteoarthritis on 01/26/2023 5:19 PM Indications: pain Details: 25 G needle, dorsal approach Medications (Right): 1 mL lidocaine 1 %; 40 mg triamcinolone acetonide 40 MG/ML Medications (Left): 1 mL lidocaine 1 %; 40 mg triamcinolone acetonide 40 MG/ML Outcome: tolerated well, no immediate complications Procedure, treatment alternatives, risks and benefits explained, specific risks discussed. Consent was given by the patient. Immediately prior to procedure a time out was called to verify the correct patient, procedure, equipment, support staff and site/side marked as required. Patient was prepped and draped  in the  usual sterile fashion.      I personally saw and evaluated the patient, and participated in the management and treatment plan.  Hazle Nordmann, PA-C Orthopedics

## 2023-05-02 ENCOUNTER — Other Ambulatory Visit (HOSPITAL_COMMUNITY): Payer: Self-pay | Admitting: Gastroenterology

## 2023-05-02 DIAGNOSIS — R1033 Periumbilical pain: Secondary | ICD-10-CM

## 2023-05-05 ENCOUNTER — Ambulatory Visit (HOSPITAL_COMMUNITY)
Admission: RE | Admit: 2023-05-05 | Discharge: 2023-05-05 | Disposition: A | Discharge status: Home / Self Care | Source: Ambulatory Visit | Attending: Gastroenterology | Admitting: Gastroenterology

## 2023-05-05 DIAGNOSIS — R1033 Periumbilical pain: Secondary | ICD-10-CM | POA: Insufficient documentation

## 2023-05-05 MED ORDER — IOHEXOL 300 MG/ML  SOLN
100.0000 mL | Freq: Once | INTRAMUSCULAR | Status: AC | PRN
  Administered 2023-05-05: 100 mL via INTRAVENOUS

## 2023-05-16 ENCOUNTER — Other Ambulatory Visit (HOSPITAL_COMMUNITY): Payer: Self-pay | Admitting: Gastroenterology

## 2023-05-16 DIAGNOSIS — K760 Fatty (change of) liver, not elsewhere classified: Secondary | ICD-10-CM

## 2023-05-19 ENCOUNTER — Ambulatory Visit (HOSPITAL_COMMUNITY)
Admission: RE | Admit: 2023-05-19 | Discharge: 2023-05-19 | Disposition: A | Discharge status: Home / Self Care | Source: Ambulatory Visit | Attending: Gastroenterology | Admitting: Gastroenterology

## 2023-05-19 DIAGNOSIS — K760 Fatty (change of) liver, not elsewhere classified: Secondary | ICD-10-CM | POA: Diagnosis present

## 2023-05-29 ENCOUNTER — Ambulatory Visit (HOSPITAL_BASED_OUTPATIENT_CLINIC_OR_DEPARTMENT_OTHER): Admitting: Orthopaedic Surgery

## 2023-05-29 ENCOUNTER — Ambulatory Visit (HOSPITAL_BASED_OUTPATIENT_CLINIC_OR_DEPARTMENT_OTHER)

## 2023-05-29 DIAGNOSIS — M25532 Pain in left wrist: Secondary | ICD-10-CM

## 2023-05-29 DIAGNOSIS — M25531 Pain in right wrist: Secondary | ICD-10-CM

## 2023-05-29 NOTE — Progress Notes (Signed)
 Chief Complaint: Wrist pain bilateral     History of Present Illness:    Patricia Ferrell is a 68 y.o. female right dominant female presents with bilateral wrist pain.  She states that this is particularly worse and flared up on the dorsal aspect of the wrist particularly as she participated in the recent yoga class.  Denies any specific injuries.  Denies any clicking or history of injury to the wrist    PMH/PSH/Family History/Social History/Meds/Allergies:    Past Medical History:  Diagnosis Date  . Anemia    years ago  . Anxiety   . Arthritis   . Asthma    bronchitis  . Back pain    lumbar  . Carpal tunnel syndrome   . Complication of anesthesia   . Depression   . Family history of adverse reaction to anesthesia    mother's blood pressure always drops   . GERD (gastroesophageal reflux disease)   . History of hiatal hernia   . Hyperlipidemia   . Numbness of right foot   . Perimenopausal   . PONV (postoperative nausea and vomiting)   . Vitamin D deficiency    Past Surgical History:  Procedure Laterality Date  . ABDOMINAL HYSTERECTOMY     DUB  . CATARACT EXTRACTION W/PHACO Right 09/08/2021   Procedure: CATARACT EXTRACTION PHACO AND INTRAOCULAR LENS PLACEMENT (IOC) RIGHT;  Surgeon: Lockie Mola, MD;  Location: [facility];  Service: Ophthalmology;  Laterality: Right;  5.05 00:55.6  . CATARACT EXTRACTION W/PHACO Left 09/22/2021   Procedure: CATARACT EXTRACTION PHACO AND INTRAOCULAR LENS PLACEMENT (IOC) LEFT 5.87 00:55.5;  Surgeon: Lockie Mola, MD;  Location: [facility];  Service: Ophthalmology;  Laterality: Left;  . CHOLECYSTECTOMY  06/19/2014  . CHOLECYSTECTOMY N/A 06/19/2014   Procedure: LAPAROSCOPIC CHOLECYSTECTOMY WITH INTRAOPERATIVE CHOLANGIOGRAM;  Surgeon: Manus Rudd, MD;  Location: [facility];  Service: General;  Laterality: N/A;  . CHONDROPLASTY Right 06/06/2019   Procedure: KNEE ARTHROSCOPY WITH CHONDROPLASTY;  Surgeon: Bjorn Pippin, MD;  Location: [facility];  Service: Orthopedics;  Laterality: Right;  . COLONOSCOPY    . KNEE ARTHROSCOPY WITH LATERAL MENISECTOMY Right 06/06/2019   Procedure: KNEE ARTHROSCOPY WITH LATERAL MENISECTOMY;  Surgeon: Bjorn Pippin, MD;  Location: [facility];  Service: Orthopedics;  Laterality: Right;  . KNEE ARTHROSCOPY WITH MEDIAL COLLATERAL LIGAMENT RECONSTRUCTION Right 06/06/2019   Procedure: KNEE ARTHROSCOPY WITH MEDIAL COLLATERAL LIGAMENT RECONSTRUCTION;  Surgeon: Bjorn Pippin, MD;  Location: [facility];  Service: Orthopedics;  Laterality: Right;  . LUMBAR LAMINECTOMY/DECOMPRESSION MICRODISCECTOMY Right 09/16/2015   Procedure: Right - Lumbar five-sacral one  lumbar laminotomy and microdiscectomy;  Surgeon: Shirlean Kelly, MD;  Location: [facility];  Service: Neurosurgery;  Laterality: Right;  . OOPHORECTOMY  02/21/94   left   Social History   Socioeconomic History  . Marital status: Divorced    Spouse name: Not on file  . Number of children: 1  . Years of education: 66  . Highest education level: Some college, no degree  Occupational History  . Occupation: special needs care  Tobacco Use  . Smoking status: Former    Current packs/day: 0.00    Types: Cigarettes    Quit date: 02/21/1988    Years since quitting: 35.2  . Smokeless tobacco: Never  Vaping Use  . Vaping status: Never Used  Substance and Sexual Activity  . Alcohol use: Yes    Alcohol/week: 0.0 standard drinks of alcohol    Comment:  occasional  . Drug use: No  . Sexual activity: Not Currently    Partners: Male    Birth control/protection: Post-menopausal    Comment: menarche 68yo, sexual debut 68yo  Other Topics Concern  . Not on file  Social History Narrative   One story home   One child   Decaf mostly    Right handed    Family history: History of hypertension osteoporosis heart disease thyroid disorder and lung tumor as well as COPD in mother.  Father with  history of hypertension heart disease and arthritis.       Social Drivers of Corporate investment banker Strain: Low Risk  (01/06/2023)   Overall Financial Resource Strain (CARDIA)   . Difficulty of Paying Living Expenses: Not very hard  Food Insecurity: No Food Insecurity (01/06/2023)   Hunger Vital Sign   . Worried About Programme researcher, broadcasting/film/video in the Last Year: Never true   . Ran Out of Food in the Last Year: Never true  Transportation Needs: No Transportation Needs (01/06/2023)   PRAPARE - Transportation   . Lack of Transportation (Medical): No   . Lack of Transportation (Non-Medical): No  Physical Activity: Insufficiently Active (01/06/2023)   Exercise Vital Sign   . Days of Exercise per Week: 2 days   . Minutes of Exercise per Session: 30 min  Stress: Stress Concern Present (01/06/2023)   [employer] of Occupational Health - Occupational Stress Questionnaire   . Feeling of Stress : To some extent  Social Connections: Socially Isolated (01/06/2023)   Social Connection and Isolation Panel [NHANES]   . Frequency of Communication with Friends and Family: More than three times a week   . Frequency of Social Gatherings with Friends and Family: More than three times a week   . Attends Religious Services: Never   . Active Member of Clubs or Organizations: No   . Attends Banker Meetings: Not on file   . Marital Status: Divorced   Family History  Problem Relation Age of Onset  . Coronary artery disease Mother   . Heart attack Mother   . Hypertension Mother   . Colon polyps Father   . Colon cancer Other    Allergies  Allergen Reactions  . Hydrocodone Nausea And Vomiting  . Oxycodone     Nausea, vomiting, dizzy  . Codeine Nausea And Vomiting   Current Outpatient Medications  Medication Sig Dispense Refill  . albuterol (VENTOLIN HFA) 108 (90 Base) MCG/ACT inhaler Inhale 2 puffs into the lungs every 6 (six) hours as needed for wheezing or shortness of breath.  8 g 0  . ALPRAZolam (XANAX) 0.5 MG tablet TAKE 1 TABLET BY MOUTH TWICE DAILY AS NEEDED (Patient not taking: Reported on 01/09/2023) 60 tablet 2  . buPROPion (WELLBUTRIN XL) 300 MG 24 hr tablet TAKE 1 TABLET BY MOUTH DAILY (Patient not taking: Reported on 01/09/2023) 90 tablet 1  . Calcium Carbonate-Vit D-Min (CALCIUM 1200 PO) calcium    . COLLAGEN PO Take 1 Scoop by mouth daily after breakfast.    . Cyanocobalamin (B-12 PO) B12    . diclofenac (VOLTAREN) 75 MG EC tablet Take 1 tablet (75 mg total) by mouth 2 (two) times daily. 30 tablet 4  . famotidine (PEPCID) 20 MG tablet Take 1 tablet (20 mg total) by mouth 2 (two) times daily. 90 tablet 3  . Multiple Vitamin (MULTIVITAMIN ADULT PO) multivitamin    . ondansetron (ZOFRAN) 4 MG tablet Take 1 tablet (4 mg  total) by mouth every 8 (eight) hours as needed for nausea or vomiting. 20 tablet 0   No current facility-administered medications for this visit.   No results found.  Review of Systems:   A ROS was performed including pertinent positives and negatives as documented in the HPI.  Physical Exam :   Constitutional: NAD and appears stated age Neurological: Alert and oriented Psych: Appropriate affect and cooperative There were no vitals taken for this visit.   Comprehensive Musculoskeletal Exam:    Tenderness about the wrist joint with passive extension bilaterally.  This is painful about the dorsal aspect of the wrist.  Full positive composite fist with good strength.  Negative scaphoid shift   Imaging:   Xray (4 views right wrist, 4 views left wrist): Normal    I personally reviewed and interpreted the radiographs.   Assessment and Plan:   68 y.o. female with dorsal wrist pain consistent with impingement with passive extension.  At today's visit I did recommend an ultrasound-guided injection of both wrist to get her some relief.  I will plan to see her back as needed  -Bilateral wrist ultrasound-guided injections provided  verbal consent obtained    Procedure Note  Patient: Patricia Ferrell             Date of Birth: [birth year]           MRN: [id number]             Visit Date: 05/29/2023  Procedures: Visit Diagnoses:  1. Pain in left wrist   2. Pain in right wrist     Medium Joint Inj: R intercarpal on 05/29/2023 11:53 AM Details: ultrasound-guided   Medium Joint Inj: L intercarpal on 05/29/2023 11:54 AM Details: ultrasound-guided       I personally saw and evaluated the patient, and participated in the management and treatment plan.  Huel Cote, MD Attending Physician, Orthopedic Surgery  This document was dictated using Dragon voice recognition software. A reasonable attempt at proof reading has been made to minimize errors.

## 2023-06-07 ENCOUNTER — Encounter (HOSPITAL_BASED_OUTPATIENT_CLINIC_OR_DEPARTMENT_OTHER): Payer: Self-pay | Admitting: Orthopaedic Surgery

## 2023-07-10 ENCOUNTER — Other Ambulatory Visit: Payer: 59

## 2023-07-11 ENCOUNTER — Ambulatory Visit: Payer: 59 | Admitting: Internal Medicine

## 2023-09-04 ENCOUNTER — Encounter (HOSPITAL_BASED_OUTPATIENT_CLINIC_OR_DEPARTMENT_OTHER): Payer: Self-pay | Admitting: Student

## 2023-09-04 ENCOUNTER — Ambulatory Visit (HOSPITAL_BASED_OUTPATIENT_CLINIC_OR_DEPARTMENT_OTHER): Admitting: Student

## 2023-09-04 DIAGNOSIS — M79642 Pain in left hand: Secondary | ICD-10-CM | POA: Diagnosis not present

## 2023-09-04 DIAGNOSIS — M79641 Pain in right hand: Secondary | ICD-10-CM | POA: Diagnosis not present

## 2023-09-04 DIAGNOSIS — M180 Bilateral primary osteoarthritis of first carpometacarpal joints: Secondary | ICD-10-CM

## 2023-09-04 MED ORDER — LIDOCAINE HCL 1 % IJ SOLN
1.0000 mL | INTRAMUSCULAR | Status: AC | PRN
Start: 2023-09-04 — End: 2023-09-04
  Administered 2023-09-04: 1 mL

## 2023-09-04 MED ORDER — TRIAMCINOLONE ACETONIDE 40 MG/ML IJ SUSP
1.0000 mL | INTRAMUSCULAR | Status: AC | PRN
Start: 2023-09-04 — End: 2023-09-04
  Administered 2023-09-04: 1 mL

## 2023-09-04 NOTE — Progress Notes (Signed)
 Chief Complaint: Bilateral hand pain     History of Present Illness:   09/04/23: Patient is a 68 year old female with history of bilateral CMC osteoarthritis who presents today for follow-up of this.  She last received cortisone injections into both CMC joints in December 2024 and reports that the relief has been wearing off in the right hand.  Overall her left thumb still has minimal symptoms.  Denies any recent injury.   Surgical History:   None  PMH/PSH/Family History/Social History/Meds/Allergies:    Past Medical History:  Diagnosis Date   Anemia    years ago   Anxiety    Arthritis    Asthma    bronchitis   Back pain    lumbar   Carpal tunnel syndrome    Complication of anesthesia    Depression    Family history of adverse reaction to anesthesia    mother's blood pressure always drops    GERD (gastroesophageal reflux disease)    History of hiatal hernia    Hyperlipidemia    Numbness of right foot    Perimenopausal    PONV (postoperative nausea and vomiting)    Vitamin D  deficiency    Past Surgical History:  Procedure Laterality Date   ABDOMINAL HYSTERECTOMY     DUB   CATARACT EXTRACTION W/PHACO Right 09/08/2021   Procedure: CATARACT EXTRACTION PHACO AND INTRAOCULAR LENS PLACEMENT (IOC) RIGHT;  Surgeon: Mittie Gaskin, MD;  Location: [facility];  Service: Ophthalmology;  Laterality: Right;  5.05 00:55.6   CATARACT EXTRACTION W/PHACO Left 09/22/2021   Procedure: CATARACT EXTRACTION PHACO AND INTRAOCULAR LENS PLACEMENT (IOC) LEFT 5.87 00:55.5;  Surgeon: Mittie Gaskin, MD;  Location: [facility];  Service: Ophthalmology;  Laterality: Left;   CHOLECYSTECTOMY  06/19/2014   CHOLECYSTECTOMY N/A 06/19/2014   Procedure: LAPAROSCOPIC CHOLECYSTECTOMY WITH INTRAOPERATIVE CHOLANGIOGRAM;  Surgeon: Donnice Lima, MD;  Location: [facility];  Service: General;  Laterality: N/A;   CHONDROPLASTY Right 06/06/2019   Procedure:  KNEE ARTHROSCOPY WITH CHONDROPLASTY;  Surgeon: Cristy Bonner DASEN, MD;  Location: [facility];  Service: Orthopedics;  Laterality: Right;   COLONOSCOPY     KNEE ARTHROSCOPY WITH LATERAL MENISECTOMY Right 06/06/2019   Procedure: KNEE ARTHROSCOPY WITH LATERAL MENISECTOMY;  Surgeon: Cristy Bonner DASEN, MD;  Location: [facility];  Service: Orthopedics;  Laterality: Right;   KNEE ARTHROSCOPY WITH MEDIAL COLLATERAL LIGAMENT RECONSTRUCTION Right 06/06/2019   Procedure: KNEE ARTHROSCOPY WITH MEDIAL COLLATERAL LIGAMENT RECONSTRUCTION;  Surgeon: Cristy Bonner DASEN, MD;  Location: [facility];  Service: Orthopedics;  Laterality: Right;   LUMBAR LAMINECTOMY/DECOMPRESSION MICRODISCECTOMY Right 09/16/2015   Procedure: Right - Lumbar five-sacral one  lumbar laminotomy and microdiscectomy;  Surgeon: Lamar Peaches, MD;  Location: [facility];  Service: Neurosurgery;  Laterality: Right;   OOPHORECTOMY  02/21/94   left   Social History   Socioeconomic History   Marital status: Divorced    Spouse name: Not on file   Number of children: 1   Years of education: 12   Highest education level: Some college, no degree  Occupational History   Occupation: special needs care  Tobacco Use   Smoking status: Former    Current packs/day: 0.00    Types: Cigarettes    Quit date: 02/21/1988    Years since quitting: 35.5   Smokeless tobacco:  Never  Vaping Use   Vaping status: Never Used  Substance and Sexual Activity   Alcohol use: Yes    Alcohol/week: 0.0 standard drinks of alcohol    Comment: occasional   Drug use: No   Sexual activity: Not Currently    Partners: Male    Birth control/protection: Post-menopausal    Comment: menarche 68yo, sexual debut 68yo  Other Topics Concern   Not on file  Social History Narrative   One story home   One child   Decaf mostly    Right handed    Family history: History of hypertension osteoporosis heart disease thyroid disorder and lung tumor as  well as COPD in mother.  Father with history of hypertension heart disease and arthritis.       Social Drivers of Corporate investment banker Strain: Low Risk  (01/06/2023)   Overall Financial Resource Strain (CARDIA)    Difficulty of Paying Living Expenses: Not very hard  Food Insecurity: Low Risk  (07/20/2023)   Received from [facility]   Hunger Vital Sign    Within the past 12 months, you worried that your food would run out before you got money to buy more: Never true    Within the past 12 months, the food you bought just didn't last and you didn't have money to get more. : Never true  Transportation Needs: No Transportation Needs (07/20/2023)   Received from [employer]    In the past 12 months, has lack of reliable transportation kept you from medical appointments, meetings, work or from getting things needed for daily living? : No  Physical Activity: Insufficiently Active (01/06/2023)   Exercise Vital Sign    Days of Exercise per Week: 2 days    Minutes of Exercise per Session: 30 min  Stress: Stress Concern Present (01/06/2023)   Patricia Ferrell    Feeling of Stress : To some extent  Social Connections: Socially Isolated (01/06/2023)   Social Connection and Isolation Panel    Frequency of Communication with Friends and Family: More than three times a week    Frequency of Social Gatherings with Friends and Family: More than three times a week    Attends Religious Services: Never    Database administrator or Organizations: No    Attends Engineer, structural: Not on file    Marital Status: Divorced   Family History  Problem Relation Age of Onset   Coronary artery disease Mother    Heart attack Mother    Hypertension Mother    Colon polyps Father    Colon cancer Other    Allergies  Allergen Reactions   Hydrocodone  Nausea And Vomiting   Oxycodone      Nausea, vomiting, dizzy    Codeine Nausea And Vomiting   Current Outpatient Medications  Medication Sig Dispense Refill   albuterol  (VENTOLIN  HFA) 108 (90 Base) MCG/ACT inhaler Inhale 2 puffs into the lungs every 6 (six) hours as needed for wheezing or shortness of breath. 8 g 0   ALPRAZolam  (XANAX ) 0.5 MG tablet TAKE 1 TABLET BY MOUTH TWICE DAILY AS NEEDED (Patient not taking: Reported on 01/09/2023) 60 tablet 2   buPROPion  (WELLBUTRIN  XL) 300 MG 24 hr tablet TAKE 1 TABLET BY MOUTH DAILY (Patient not taking: Reported on 01/09/2023) 90 tablet 1   Calcium Carbonate-Vit D-Min (CALCIUM 1200 PO) calcium     COLLAGEN PO Take 1 Scoop by mouth daily  after breakfast.     Cyanocobalamin (B-12 PO) B12     diclofenac  (VOLTAREN ) 75 MG EC tablet Take 1 tablet (75 mg total) by mouth 2 (two) times daily. 30 tablet 4   famotidine  (PEPCID ) 20 MG tablet Take 1 tablet (20 mg total) by mouth 2 (two) times daily. 90 tablet 3   Multiple Vitamin (MULTIVITAMIN ADULT PO) multivitamin     ondansetron  (ZOFRAN ) 4 MG tablet Take 1 tablet (4 mg total) by mouth every 8 (eight) hours as needed for nausea or vomiting. 20 tablet 0   No current facility-administered medications for this visit.   No results found.  Review of Systems:   A ROS was performed including pertinent positives and negatives as documented in the HPI.  Physical Exam :   Constitutional: NAD and appears stated age Neurological: Alert and oriented Psych: Appropriate affect and cooperative There were no vitals taken for this visit.   Comprehensive Musculoskeletal Exam:    Tenderness with palpation over the first [facility] joint of the right hand without overlying erythema or warmth.  Positive grind test with negative Finkelstein's.  Grip strength slightly reduced within the right hand.  Full range of motion of the thumb at the IP and MCP.  Imaging:    Assessment:   68 y.o. female with history of bilateral thumb CMC arthritis.  This has been managed well with injections in the  past, and she last received these over 7 months ago.  She really only presents with pain and symptoms in the right hand today so we did discuss repeating an injection which she is agreeable to.  Recommend holding off on injection in the left side until this becomes symptomatic.  Injection was performed successfully under ultrasound guidance into the right first [facility] joint.  Plan to have her follow-up as needed.  Plan :    -Right thumb CMC injection performed today and follow-up as needed     Procedure Note  Patient: Patricia Ferrell             Date of Birth: October 25, 1955           MRN: [id number]             Visit Date: 09/04/2023  Procedures: Visit Diagnoses:  1. Arthritis of carpometacarpal [facility]) joint of both thumbs       Hand/UE Inj: R thumb CMC for osteoarthritis on 09/04/2023 8:57 AM Indications: pain Details: 25 G needle, radial approach Medications: 1 mL lidocaine  1 %; 1 mL triamcinolone  acetonide 40 MG/ML Outcome: tolerated well, no immediate complications Procedure, treatment alternatives, risks and benefits explained, specific risks discussed. Consent was given by the patient. Immediately prior to procedure a time out was called to verify the correct patient, procedure, equipment, support staff and site/side marked as required. Patient was prepped and draped in the usual sterile fashion.      I personally saw and evaluated the patient, and participated in the management and treatment plan.  Leonce Reveal, PA-C Orthopedics

## 2023-09-18 ENCOUNTER — Other Ambulatory Visit: Payer: Self-pay

## 2023-11-16 ENCOUNTER — Encounter (HOSPITAL_BASED_OUTPATIENT_CLINIC_OR_DEPARTMENT_OTHER): Payer: Self-pay | Admitting: Orthopaedic Surgery

## 2023-11-17 ENCOUNTER — Other Ambulatory Visit (HOSPITAL_BASED_OUTPATIENT_CLINIC_OR_DEPARTMENT_OTHER): Payer: Self-pay | Admitting: Orthopaedic Surgery

## 2023-11-17 MED ORDER — DICLOFENAC SODIUM 75 MG PO TBEC: 75.0000 mg | DELAYED_RELEASE_TABLET | Freq: Two times a day (BID) | ORAL | 4 refills | Status: AC

## 2023-12-25 ENCOUNTER — Encounter: Payer: Self-pay | Admitting: Radiology

## 2024-01-10 ENCOUNTER — Ambulatory Visit (HOSPITAL_BASED_OUTPATIENT_CLINIC_OR_DEPARTMENT_OTHER): Admitting: Student

## 2024-01-24 ENCOUNTER — Ambulatory Visit (HOSPITAL_BASED_OUTPATIENT_CLINIC_OR_DEPARTMENT_OTHER): Admitting: Orthopaedic Surgery

## 2024-01-31 ENCOUNTER — Ambulatory Visit (HOSPITAL_BASED_OUTPATIENT_CLINIC_OR_DEPARTMENT_OTHER): Admitting: Orthopaedic Surgery

## 2024-01-31 DIAGNOSIS — M25532 Pain in left wrist: Secondary | ICD-10-CM

## 2024-01-31 DIAGNOSIS — M79642 Pain in left hand: Secondary | ICD-10-CM

## 2024-01-31 DIAGNOSIS — M79641 Pain in right hand: Secondary | ICD-10-CM

## 2024-01-31 DIAGNOSIS — M25531 Pain in right wrist: Secondary | ICD-10-CM

## 2024-01-31 DIAGNOSIS — M180 Bilateral primary osteoarthritis of first carpometacarpal joints: Secondary | ICD-10-CM

## 2024-01-31 NOTE — Progress Notes (Signed)
 Chief Complaint: Wrist pain bilateral     History of Present Illness:   01/31/2024: Presents today for follow-up of bilateral CMC joints requesting injection  Patricia Ferrell is a 68 y.o. female right dominant female presents with bilateral wrist pain.  She states that this is particularly worse and flared up on the dorsal aspect of the wrist particularly as she participated in the recent yoga class.  Denies any specific injuries.  Denies any clicking or history of injury to the wrist    PMH/PSH/Family History/Social History/Meds/Allergies:    Past Medical History:  Diagnosis Date   Anemia    years ago   Anxiety    Arthritis    Asthma    bronchitis   Back pain    lumbar   Carpal tunnel syndrome    Complication of anesthesia    Depression    Family history of adverse reaction to anesthesia    mother's blood pressure always drops    GERD (gastroesophageal reflux disease)    History of hiatal hernia    Hyperlipidemia    Numbness of right foot    Perimenopausal    PONV (postoperative nausea and vomiting)    Vitamin D  deficiency    Past Surgical History:  Procedure Laterality Date   ABDOMINAL HYSTERECTOMY     DUB   CATARACT EXTRACTION W/PHACO Right 09/08/2021   Procedure: CATARACT EXTRACTION PHACO AND INTRAOCULAR LENS PLACEMENT (IOC) RIGHT;  Surgeon: Mittie Gaskin, MD;  Location: [facility];  Service: Ophthalmology;  Laterality: Right;  5.05 00:55.6   CATARACT EXTRACTION W/PHACO Left 09/22/2021   Procedure: CATARACT EXTRACTION PHACO AND INTRAOCULAR LENS PLACEMENT (IOC) LEFT 5.87 00:55.5;  Surgeon: Mittie Gaskin, MD;  Location: [facility];  Service: Ophthalmology;  Laterality: Left;   CHOLECYSTECTOMY  06/19/2014   CHOLECYSTECTOMY N/A 06/19/2014   Procedure: LAPAROSCOPIC CHOLECYSTECTOMY WITH INTRAOPERATIVE CHOLANGIOGRAM;  Surgeon: Donnice Lima, MD;  Location: [facility];  Service: General;  Laterality: N/A;    CHONDROPLASTY Right 06/06/2019   Procedure: KNEE ARTHROSCOPY WITH CHONDROPLASTY;  Surgeon: Cristy Bonner DASEN, MD;  Location: [facility];  Service: Orthopedics;  Laterality: Right;   COLONOSCOPY     KNEE ARTHROSCOPY WITH LATERAL MENISECTOMY Right 06/06/2019   Procedure: KNEE ARTHROSCOPY WITH LATERAL MENISECTOMY;  Surgeon: Cristy Bonner DASEN, MD;  Location: [facility];  Service: Orthopedics;  Laterality: Right;   KNEE ARTHROSCOPY WITH MEDIAL COLLATERAL LIGAMENT RECONSTRUCTION Right 06/06/2019   Procedure: KNEE ARTHROSCOPY WITH MEDIAL COLLATERAL LIGAMENT RECONSTRUCTION;  Surgeon: Cristy Bonner DASEN, MD;  Location: [facility];  Service: Orthopedics;  Laterality: Right;   LUMBAR LAMINECTOMY/DECOMPRESSION MICRODISCECTOMY Right 09/16/2015   Procedure: Right - Lumbar five-sacral one  lumbar laminotomy and microdiscectomy;  Surgeon: Lamar Peaches, MD;  Location: [facility];  Service: Neurosurgery;  Laterality: Right;   OOPHORECTOMY  02/21/94   left   Social History   Socioeconomic History   Marital status: Divorced    Spouse name: Not on file   Number of children: 1   Years of education: 12   Highest education level: Some college, no degree  Occupational History   Occupation: special needs care  Tobacco Use   Smoking status: Former    Current packs/day: 0.00    Types: Cigarettes    Quit date: 02/21/1988    Years since quitting: 35.9   Smokeless tobacco: Never  Vaping Use   Vaping status: Never Used  Substance and Sexual Activity   Alcohol use: Yes  Alcohol/week: 0.0 standard drinks of alcohol    Comment: occasional   Drug use: No   Sexual activity: Not Currently    Partners: Male    Birth control/protection: Post-menopausal    Comment: menarche 68yo, sexual debut 68yo  Other Topics Concern   Not on file  Social History Narrative   One story home   One child   Decaf mostly    Right handed    Family history: History of hypertension  osteoporosis heart disease thyroid disorder and lung tumor as well as COPD in mother.  Father with history of hypertension heart disease and arthritis.       Social Drivers of Corporate Investment Banker Strain: Low Risk  (01/06/2023)   Overall Financial Resource Strain (CARDIA)    Difficulty of Paying Living Expenses: Not very hard  Food Insecurity: Low Risk (07/20/2023)   Received from [facility]   Hunger Vital Sign    Within the past 12 months, you worried that your food would run out before you got money to buy more: Never true    Within the past 12 months, the food you bought just didn't last and you didn't have money to get more. : Never true  Transportation Needs: No Transportation Needs (07/20/2023)   Received from [employer]    In the past 12 months, has lack of reliable transportation kept you from medical appointments, meetings, work or from getting things needed for daily living? : No  Physical Activity: Insufficiently Active (01/06/2023)   Exercise Vital Sign    Days of Exercise per Week: 2 days    Minutes of Exercise per Session: 30 min  Stress: Stress Concern Present (01/06/2023)   [employer] of Occupational Health - Occupational Stress Questionnaire    Feeling of Stress : To some extent  Social Connections: Socially Isolated (01/06/2023)   Social Connection and Isolation Panel    Frequency of Communication with Friends and Family: More than three times a week    Frequency of Social Gatherings with Friends and Family: More than three times a week    Attends Religious Services: Never    Database Administrator or Organizations: No    Attends Engineer, Structural: Not on file    Marital Status: Divorced   Family History  Problem Relation Age of Onset   Coronary artery disease Mother    Heart attack Mother    Hypertension Mother    Colon polyps Father    Colon cancer Other    Allergies  Allergen Reactions    Hydrocodone  Nausea And Vomiting   Oxycodone      Nausea, vomiting, dizzy   Codeine Nausea And Vomiting   Current Outpatient Medications  Medication Sig Dispense Refill   albuterol  (VENTOLIN  HFA) 108 (90 Base) MCG/ACT inhaler Inhale 2 puffs into the lungs every 6 (six) hours as needed for wheezing or shortness of breath. 8 g 0   ALPRAZolam  (XANAX ) 0.5 MG tablet TAKE 1 TABLET BY MOUTH TWICE DAILY AS NEEDED (Patient not taking: Reported on 01/09/2023) 60 tablet 2   buPROPion  (WELLBUTRIN  XL) 300 MG 24 hr tablet TAKE 1 TABLET BY MOUTH DAILY (Patient not taking: Reported on 01/09/2023) 90 tablet 1   Calcium Carbonate-Vit D-Min (CALCIUM 1200 PO) calcium     COLLAGEN PO Take 1 Scoop by mouth daily after breakfast.     Cyanocobalamin (B-12 PO) B12     diclofenac  (VOLTAREN ) 75 MG EC tablet Take 1 tablet (75  mg total) by mouth 2 (two) times daily. 30 tablet 4   famotidine  (PEPCID ) 20 MG tablet Take 1 tablet (20 mg total) by mouth 2 (two) times daily. 90 tablet 3   Multiple Vitamin (MULTIVITAMIN ADULT PO) multivitamin     ondansetron  (ZOFRAN ) 4 MG tablet Take 1 tablet (4 mg total) by mouth every 8 (eight) hours as needed for nausea or vomiting. 20 tablet 0   No current facility-administered medications for this visit.   No results found.  Review of Systems:   A ROS was performed including pertinent positives and negatives as documented in the HPI.  Physical Exam :   Constitutional: NAD and appears stated age Neurological: Alert and oriented Psych: Appropriate affect and cooperative There were no vitals taken for this visit.   Comprehensive Musculoskeletal Exam:    Tenderness about the wrist joint with passive extension bilaterally.  This is painful about the dorsal aspect of the wrist.  Full positive composite fist with good strength.  Negative scaphoid shift   Imaging:   Xray (4 views right wrist, 4 views left wrist): Normal    I personally reviewed and interpreted the  radiographs.   Assessment and Plan:   68 y.o. female with dorsal wrist pain consistent with CMC osteoarthritis.  At this time she has requested bilateral thumb base CMC injections    Procedure Note  Patient: Patricia Ferrell             Date of Birth: 11-21-1955           MRN: [id number]             Visit Date: 01/31/2024  Procedures: Visit Diagnoses:  No diagnosis found.   Hand/UE Inj on 01/31/2024 5:50 PM Details: ultrasound-guided dorsal approach   Hand/UE Inj on 01/31/2024 5:50 PM Details: ultrasound-guided       I personally saw and evaluated the patient, and participated in the management and treatment plan.  Elspeth Parker, MD Attending Physician, Orthopedic Surgery  This document was dictated using Dragon voice recognition software. A reasonable attempt at proof reading has been made to minimize errors.

## 2024-02-01 ENCOUNTER — Telehealth (HOSPITAL_BASED_OUTPATIENT_CLINIC_OR_DEPARTMENT_OTHER): Payer: Self-pay | Admitting: Orthopaedic Surgery

## 2024-02-01 NOTE — Telephone Encounter (Signed)
 Patient states that she spoke to Dr B on yesterday about a neck injury for her to get sch. Patient sch in his first ava is that ok or please advise [phone]

## 2024-02-06 ENCOUNTER — Encounter (HOSPITAL_BASED_OUTPATIENT_CLINIC_OR_DEPARTMENT_OTHER): Payer: Self-pay | Admitting: Orthopaedic Surgery

## 2024-02-08 ENCOUNTER — Ambulatory Visit (HOSPITAL_BASED_OUTPATIENT_CLINIC_OR_DEPARTMENT_OTHER): Admitting: Student

## 2024-02-08 ENCOUNTER — Ambulatory Visit (HOSPITAL_BASED_OUTPATIENT_CLINIC_OR_DEPARTMENT_OTHER)

## 2024-02-08 DIAGNOSIS — M542 Cervicalgia: Secondary | ICD-10-CM

## 2024-02-08 DIAGNOSIS — G4486 Cervicogenic headache: Secondary | ICD-10-CM | POA: Diagnosis not present

## 2024-02-08 MED ORDER — METHOCARBAMOL 500 MG PO TABS: 500.0000 mg | ORAL_TABLET | Freq: Four times a day (QID) | ORAL | 0 refills | Status: AC

## 2024-02-08 NOTE — Progress Notes (Deleted)
Dg  
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
## 2024-02-09 ENCOUNTER — Ambulatory Visit (HOSPITAL_BASED_OUTPATIENT_CLINIC_OR_DEPARTMENT_OTHER): Admitting: Student

## 2024-02-26 ENCOUNTER — Encounter (HOSPITAL_BASED_OUTPATIENT_CLINIC_OR_DEPARTMENT_OTHER): Payer: Self-pay | Admitting: Orthopaedic Surgery

## 2024-02-26 DIAGNOSIS — M180 Bilateral primary osteoarthritis of first carpometacarpal joints: Secondary | ICD-10-CM

## 2024-02-27 ENCOUNTER — Ambulatory Visit

## 2024-03-07 ENCOUNTER — Ambulatory Visit (HOSPITAL_BASED_OUTPATIENT_CLINIC_OR_DEPARTMENT_OTHER): Admitting: Orthopaedic Surgery
